# Patient Record
Sex: Female | Born: 1945
Health system: Southern US, Community
[De-identification: ages and names within clinical notes are randomized; demographics above are authoritative.]

## PROBLEM LIST (undated history)

## (undated) DIAGNOSIS — K635 Polyp of colon: Secondary | ICD-10-CM

## (undated) DIAGNOSIS — G43909 Migraine, unspecified, not intractable, without status migrainosus: Secondary | ICD-10-CM

## (undated) DIAGNOSIS — E7800 Pure hypercholesterolemia, unspecified: Secondary | ICD-10-CM

## (undated) DIAGNOSIS — F32A Depression, unspecified: Secondary | ICD-10-CM

## (undated) DIAGNOSIS — E039 Hypothyroidism, unspecified: Secondary | ICD-10-CM

## (undated) DIAGNOSIS — K589 Irritable bowel syndrome without diarrhea: Secondary | ICD-10-CM

## (undated) DIAGNOSIS — G629 Polyneuropathy, unspecified: Secondary | ICD-10-CM

## (undated) DIAGNOSIS — F419 Anxiety disorder, unspecified: Secondary | ICD-10-CM

## (undated) DIAGNOSIS — F329 Major depressive disorder, single episode, unspecified: Secondary | ICD-10-CM

## (undated) DIAGNOSIS — R002 Palpitations: Secondary | ICD-10-CM

## (undated) HISTORY — PX: TUBAL LIGATION: SHX77

---

## 1999-12-05 ENCOUNTER — Encounter: Admission: RE | Admit: 1999-12-05 | Discharge: 1999-12-05 | Payer: Self-pay | Admitting: Family Medicine

## 1999-12-05 ENCOUNTER — Encounter: Payer: Self-pay | Admitting: Family Medicine

## 2000-03-17 ENCOUNTER — Ambulatory Visit (HOSPITAL_COMMUNITY): Admission: RE | Admit: 2000-03-17 | Discharge: 2000-03-17 | Payer: Self-pay | Admitting: Obstetrics and Gynecology

## 2000-03-17 ENCOUNTER — Encounter: Payer: Self-pay | Admitting: Obstetrics and Gynecology

## 2000-03-25 ENCOUNTER — Encounter (INDEPENDENT_AMBULATORY_CARE_PROVIDER_SITE_OTHER): Payer: Self-pay | Admitting: Specialist

## 2000-03-25 ENCOUNTER — Ambulatory Visit (HOSPITAL_COMMUNITY): Admission: RE | Admit: 2000-03-25 | Discharge: 2000-03-25 | Payer: Self-pay | Admitting: Gastroenterology

## 2001-03-04 ENCOUNTER — Other Ambulatory Visit: Admission: RE | Admit: 2001-03-04 | Discharge: 2001-03-04 | Payer: Self-pay | Admitting: Obstetrics and Gynecology

## 2001-03-23 ENCOUNTER — Ambulatory Visit (HOSPITAL_COMMUNITY): Admission: RE | Admit: 2001-03-23 | Discharge: 2001-03-23 | Payer: Self-pay | Admitting: Obstetrics and Gynecology

## 2001-03-23 ENCOUNTER — Encounter: Payer: Self-pay | Admitting: Obstetrics and Gynecology

## 2001-04-20 ENCOUNTER — Encounter: Payer: Self-pay | Admitting: Obstetrics and Gynecology

## 2001-04-20 ENCOUNTER — Encounter: Admission: RE | Admit: 2001-04-20 | Discharge: 2001-04-20 | Payer: Self-pay | Admitting: Obstetrics and Gynecology

## 2002-03-29 ENCOUNTER — Encounter: Payer: Self-pay | Admitting: Obstetrics and Gynecology

## 2002-03-29 ENCOUNTER — Ambulatory Visit (HOSPITAL_COMMUNITY): Admission: RE | Admit: 2002-03-29 | Discharge: 2002-03-29 | Payer: Self-pay | Admitting: Obstetrics and Gynecology

## 2002-04-06 ENCOUNTER — Other Ambulatory Visit: Admission: RE | Admit: 2002-04-06 | Discharge: 2002-04-06 | Payer: Self-pay | Admitting: Obstetrics and Gynecology

## 2003-04-18 ENCOUNTER — Ambulatory Visit (HOSPITAL_COMMUNITY): Admission: RE | Admit: 2003-04-18 | Discharge: 2003-04-18 | Payer: Self-pay | Admitting: Obstetrics and Gynecology

## 2003-06-14 ENCOUNTER — Other Ambulatory Visit: Admission: RE | Admit: 2003-06-14 | Discharge: 2003-06-14 | Payer: Self-pay | Admitting: Obstetrics and Gynecology

## 2003-07-28 ENCOUNTER — Ambulatory Visit (HOSPITAL_COMMUNITY): Admission: RE | Admit: 2003-07-28 | Discharge: 2003-07-28 | Payer: Self-pay | Admitting: Gastroenterology

## 2004-04-04 ENCOUNTER — Encounter: Admission: RE | Admit: 2004-04-04 | Discharge: 2004-04-04 | Payer: Self-pay | Admitting: Otolaryngology

## 2004-04-25 ENCOUNTER — Ambulatory Visit (HOSPITAL_COMMUNITY): Admission: RE | Admit: 2004-04-25 | Discharge: 2004-04-25 | Payer: Self-pay | Admitting: Obstetrics and Gynecology

## 2004-07-08 ENCOUNTER — Other Ambulatory Visit: Admission: RE | Admit: 2004-07-08 | Discharge: 2004-07-08 | Payer: Self-pay | Admitting: Obstetrics and Gynecology

## 2004-11-15 ENCOUNTER — Encounter: Admission: RE | Admit: 2004-11-15 | Discharge: 2004-11-15 | Payer: Self-pay | Admitting: Gastroenterology

## 2005-01-24 ENCOUNTER — Encounter: Admission: RE | Admit: 2005-01-24 | Discharge: 2005-01-24 | Payer: Self-pay | Admitting: Family Medicine

## 2005-06-23 ENCOUNTER — Ambulatory Visit (HOSPITAL_COMMUNITY): Admission: RE | Admit: 2005-06-23 | Discharge: 2005-06-23 | Payer: Self-pay | Admitting: Obstetrics and Gynecology

## 2005-08-19 ENCOUNTER — Other Ambulatory Visit: Admission: RE | Admit: 2005-08-19 | Discharge: 2005-08-19 | Payer: Self-pay | Admitting: Obstetrics and Gynecology

## 2005-10-16 ENCOUNTER — Encounter: Admission: RE | Admit: 2005-10-16 | Discharge: 2005-10-16 | Payer: Self-pay | Admitting: Surgery

## 2006-07-08 ENCOUNTER — Ambulatory Visit (HOSPITAL_COMMUNITY): Admission: RE | Admit: 2006-07-08 | Discharge: 2006-07-08 | Payer: Self-pay | Admitting: Obstetrics and Gynecology

## 2006-07-09 ENCOUNTER — Ambulatory Visit (HOSPITAL_COMMUNITY): Admission: RE | Admit: 2006-07-09 | Discharge: 2006-07-09 | Payer: Self-pay | Admitting: Obstetrics and Gynecology

## 2007-07-13 ENCOUNTER — Ambulatory Visit (HOSPITAL_COMMUNITY): Admission: RE | Admit: 2007-07-13 | Discharge: 2007-07-13 | Payer: Self-pay | Admitting: Obstetrics and Gynecology

## 2008-07-20 ENCOUNTER — Ambulatory Visit (HOSPITAL_COMMUNITY): Admission: RE | Admit: 2008-07-20 | Discharge: 2008-07-20 | Payer: Self-pay | Admitting: Obstetrics and Gynecology

## 2009-07-26 ENCOUNTER — Ambulatory Visit (HOSPITAL_COMMUNITY): Admission: RE | Admit: 2009-07-26 | Discharge: 2009-07-26 | Payer: Self-pay | Admitting: Obstetrics and Gynecology

## 2010-05-05 ENCOUNTER — Encounter: Payer: Self-pay | Admitting: Obstetrics and Gynecology

## 2010-06-21 ENCOUNTER — Other Ambulatory Visit (HOSPITAL_COMMUNITY): Payer: Self-pay | Admitting: Obstetrics and Gynecology

## 2010-06-21 DIAGNOSIS — Z780 Asymptomatic menopausal state: Secondary | ICD-10-CM

## 2010-06-21 DIAGNOSIS — Z1231 Encounter for screening mammogram for malignant neoplasm of breast: Secondary | ICD-10-CM

## 2010-07-29 ENCOUNTER — Ambulatory Visit (HOSPITAL_COMMUNITY)
Admission: RE | Admit: 2010-07-29 | Discharge: 2010-07-29 | Disposition: A | Discharge status: Home / Self Care | Payer: Medicare Other | Source: Ambulatory Visit | Attending: Obstetrics and Gynecology | Admitting: Obstetrics and Gynecology

## 2010-07-29 DIAGNOSIS — Z1231 Encounter for screening mammogram for malignant neoplasm of breast: Secondary | ICD-10-CM

## 2010-07-29 DIAGNOSIS — Z13820 Encounter for screening for osteoporosis: Secondary | ICD-10-CM | POA: Insufficient documentation

## 2010-07-29 DIAGNOSIS — Z780 Asymptomatic menopausal state: Secondary | ICD-10-CM

## 2010-08-30 NOTE — Procedures (Signed)
[facility]  Patient:    Toni Rich, Toni Rich                          MRN: [id number] Proc. Date: 03/25/00 Attending:  Everardo All. Madilyn Fireman, M.D. CC:         Anna Genre. Little, M.D.   Procedure Report  PROCEDURE:  Colonoscopy with polypectomy.  INDICATION FOR PROCEDURE:  History of adenomatous colon polyps on colonoscopy 3 years ago.  DESCRIPTION OF PROCEDURE:  The patient was placed in the left lateral decubitus position and placed on the pulse monitor with continuous low flow oxygen delivered by nasal cannula. She was sedated with 75 mg IV Demerol and 7 mg IV Versed. The Olympus video colonoscope was inserted into the rectum and advanced to the cecum, confirmed by transillumination at McBurneys point and visualization of the ileocecal valve and appendiceal orifice. The prep was excellent. The cecum appeared normal. Within the ascending colon, there was a sessile 4 x 1.2 cm polyp which was fulgurated by hot biopsy with 2 or 3 applications. The remainder of the ascending, transverse and descending colon appeared normal with no further masses, polyps, diverticula or other mucosal abnormalities. Within the sigmoid colon, there were seen a few scattered diverticula and within the rectum there was seen a 6 mm round sessile polyp which was fulgurated by hot biopsy. The rectum otherwise appeared normal and retroflexed view of the anus revealed no obvious internal hemorrhoids. The colonoscope was then withdrawn and the patient returned to the recovery room in stable condition. The patient tolerated the procedure well and there were no immediate complications.  IMPRESSION: 1. Ascending and rectal colon polyps. 2. Left sided diverticulosis.  PLAN:  Await histology and will probably repeat colonoscopy in 3 years. DD:  03/25/00 TD:  03/25/00 Job: [id number]

## 2010-08-30 NOTE — Op Note (Signed)
NAME:  Toni Rich, Toni Rich                           ACCOUNT NO.:  [id number]   MEDICAL RECORD NO.:  [id number]                   PATIENT TYPE:  AMB   LOCATION:  ENDO                                 FACILITY:  [facility]   PHYSICIAN:  John C. Madilyn Fireman, M.D.                 DATE OF BIRTH:  [birth year]   DATE OF PROCEDURE:  07/28/2003  DATE OF DISCHARGE:                                 OPERATIVE REPORT   PROCEDURE:  Colonoscopy.   INDICATIONS FOR PROCEDURE:  History of adenomatous colon polyps in 1998 and  2001.   PROCEDURE:  The patient was placed in the left lateral decubitus position  and placed on the pulse monitor with continuous low flow oxygen delivered by  nasal cannula.  She was sedated with 75 mcg IV fentanyl and 10 mg IV Versed.  The Olympus video colonoscope is inserted into the rectum and advanced to  the cecum, confirmed by transillumination at McBurney's point and  visualization of the ileocecal valve and appendiceal orifice.  Prep is  excellent.  The cecum, ascending, transverse, and descending colon all  appeared normal with no masses, polyps, diverticula, or other mucosal  abnormalities.  In the sigmoid colon, there was seen a few scattered  diverticula.  No other abnormalities.  The rectum appeared normal, and  retroflexed view of the anus revealed no obvious internal hemorrhoids.  The  scope was then withdrawn, and the patient returned to the recovery room in  stable condition.  She tolerated the procedure well, and there were no  immediate complications.   IMPRESSION:  Sigmoid diverticulosis, otherwise normal study.   PLAN:  Colonoscopy in five years.                                               John C. Madilyn Fireman, M.D.    JCH/MEDQ  D:  07/28/2003  T:  07/28/2003  Job:  [id number]   cc:   Caryn Bee L. Little, M.D.  [address]  [place]  [place] [address]  Fax: [phone]

## 2011-05-12 DIAGNOSIS — K219 Gastro-esophageal reflux disease without esophagitis: Secondary | ICD-10-CM | POA: Diagnosis not present

## 2011-05-12 DIAGNOSIS — E039 Hypothyroidism, unspecified: Secondary | ICD-10-CM | POA: Diagnosis not present

## 2011-05-12 DIAGNOSIS — F3289 Other specified depressive episodes: Secondary | ICD-10-CM | POA: Diagnosis not present

## 2011-05-12 DIAGNOSIS — E7800 Pure hypercholesterolemia, unspecified: Secondary | ICD-10-CM | POA: Diagnosis not present

## 2011-05-12 DIAGNOSIS — Z79899 Other long term (current) drug therapy: Secondary | ICD-10-CM | POA: Diagnosis not present

## 2011-05-12 DIAGNOSIS — F329 Major depressive disorder, single episode, unspecified: Secondary | ICD-10-CM | POA: Diagnosis not present

## 2011-05-12 DIAGNOSIS — R002 Palpitations: Secondary | ICD-10-CM | POA: Diagnosis not present

## 2011-05-12 DIAGNOSIS — R82998 Other abnormal findings in urine: Secondary | ICD-10-CM | POA: Diagnosis not present

## 2011-06-09 DIAGNOSIS — R109 Unspecified abdominal pain: Secondary | ICD-10-CM | POA: Diagnosis not present

## 2011-06-13 DIAGNOSIS — R799 Abnormal finding of blood chemistry, unspecified: Secondary | ICD-10-CM | POA: Diagnosis not present

## 2011-06-19 DIAGNOSIS — D7589 Other specified diseases of blood and blood-forming organs: Secondary | ICD-10-CM | POA: Diagnosis not present

## 2011-06-19 DIAGNOSIS — D751 Secondary polycythemia: Secondary | ICD-10-CM | POA: Diagnosis not present

## 2011-07-01 DIAGNOSIS — K589 Irritable bowel syndrome without diarrhea: Secondary | ICD-10-CM | POA: Diagnosis not present

## 2011-07-01 DIAGNOSIS — F411 Generalized anxiety disorder: Secondary | ICD-10-CM | POA: Diagnosis not present

## 2011-07-01 DIAGNOSIS — F329 Major depressive disorder, single episode, unspecified: Secondary | ICD-10-CM | POA: Diagnosis not present

## 2011-07-01 DIAGNOSIS — F3289 Other specified depressive episodes: Secondary | ICD-10-CM | POA: Diagnosis not present

## 2011-07-15 ENCOUNTER — Other Ambulatory Visit (HOSPITAL_COMMUNITY): Payer: Self-pay | Admitting: Obstetrics and Gynecology

## 2011-07-15 DIAGNOSIS — Z1231 Encounter for screening mammogram for malignant neoplasm of breast: Secondary | ICD-10-CM

## 2011-07-21 DIAGNOSIS — G43019 Migraine without aura, intractable, without status migrainosus: Secondary | ICD-10-CM | POA: Diagnosis not present

## 2011-08-14 ENCOUNTER — Ambulatory Visit (HOSPITAL_COMMUNITY)
Admission: RE | Admit: 2011-08-14 | Discharge: 2011-08-14 | Disposition: A | Discharge status: Home / Self Care | Payer: Medicare Other | Source: Ambulatory Visit | Attending: Obstetrics and Gynecology | Admitting: Obstetrics and Gynecology

## 2011-08-14 DIAGNOSIS — Z1231 Encounter for screening mammogram for malignant neoplasm of breast: Secondary | ICD-10-CM

## 2011-09-20 DIAGNOSIS — H10439 Chronic follicular conjunctivitis, unspecified eye: Secondary | ICD-10-CM | POA: Diagnosis not present

## 2011-09-24 DIAGNOSIS — D7589 Other specified diseases of blood and blood-forming organs: Secondary | ICD-10-CM | POA: Diagnosis not present

## 2011-09-24 DIAGNOSIS — R0602 Shortness of breath: Secondary | ICD-10-CM | POA: Diagnosis not present

## 2011-09-24 DIAGNOSIS — R5383 Other fatigue: Secondary | ICD-10-CM | POA: Diagnosis not present

## 2011-09-24 DIAGNOSIS — R5381 Other malaise: Secondary | ICD-10-CM | POA: Diagnosis not present

## 2011-10-20 DIAGNOSIS — R0602 Shortness of breath: Secondary | ICD-10-CM | POA: Diagnosis not present

## 2011-10-30 DIAGNOSIS — Z0000 Encounter for general adult medical examination without abnormal findings: Secondary | ICD-10-CM | POA: Diagnosis not present

## 2011-10-30 DIAGNOSIS — Z01419 Encounter for gynecological examination (general) (routine) without abnormal findings: Secondary | ICD-10-CM | POA: Diagnosis not present

## 2011-10-30 DIAGNOSIS — Z124 Encounter for screening for malignant neoplasm of cervix: Secondary | ICD-10-CM | POA: Diagnosis not present

## 2011-10-31 DIAGNOSIS — R0602 Shortness of breath: Secondary | ICD-10-CM | POA: Diagnosis not present

## 2011-11-07 DIAGNOSIS — L259 Unspecified contact dermatitis, unspecified cause: Secondary | ICD-10-CM | POA: Diagnosis not present

## 2011-11-07 DIAGNOSIS — R0989 Other specified symptoms and signs involving the circulatory and respiratory systems: Secondary | ICD-10-CM | POA: Diagnosis not present

## 2011-12-06 DIAGNOSIS — H5200 Hypermetropia, unspecified eye: Secondary | ICD-10-CM | POA: Diagnosis not present

## 2011-12-06 DIAGNOSIS — H2510 Age-related nuclear cataract, unspecified eye: Secondary | ICD-10-CM | POA: Diagnosis not present

## 2011-12-06 DIAGNOSIS — H524 Presbyopia: Secondary | ICD-10-CM | POA: Diagnosis not present

## 2011-12-06 DIAGNOSIS — H52229 Regular astigmatism, unspecified eye: Secondary | ICD-10-CM | POA: Diagnosis not present

## 2011-12-13 ENCOUNTER — Encounter (HOSPITAL_BASED_OUTPATIENT_CLINIC_OR_DEPARTMENT_OTHER): Payer: Self-pay | Admitting: Emergency Medicine

## 2011-12-13 ENCOUNTER — Emergency Department (HOSPITAL_BASED_OUTPATIENT_CLINIC_OR_DEPARTMENT_OTHER): Payer: Medicare Other

## 2011-12-13 ENCOUNTER — Inpatient Hospital Stay (HOSPITAL_BASED_OUTPATIENT_CLINIC_OR_DEPARTMENT_OTHER)
Admission: EM | Admit: 2011-12-13 | Discharge: 2011-12-18 | DRG: 386 | Disposition: A | Discharge status: Home / Self Care | Payer: Medicare Other | Attending: Internal Medicine | Admitting: Internal Medicine

## 2011-12-13 DIAGNOSIS — J9819 Other pulmonary collapse: Secondary | ICD-10-CM | POA: Diagnosis not present

## 2011-12-13 DIAGNOSIS — G609 Hereditary and idiopathic neuropathy, unspecified: Secondary | ICD-10-CM | POA: Diagnosis present

## 2011-12-13 DIAGNOSIS — K529 Noninfective gastroenteritis and colitis, unspecified: Secondary | ICD-10-CM

## 2011-12-13 DIAGNOSIS — K589 Irritable bowel syndrome without diarrhea: Secondary | ICD-10-CM | POA: Diagnosis present

## 2011-12-13 DIAGNOSIS — Z882 Allergy status to sulfonamides status: Secondary | ICD-10-CM | POA: Diagnosis not present

## 2011-12-13 DIAGNOSIS — R002 Palpitations: Secondary | ICD-10-CM | POA: Diagnosis present

## 2011-12-13 DIAGNOSIS — Z87891 Personal history of nicotine dependence: Secondary | ICD-10-CM

## 2011-12-13 DIAGNOSIS — R110 Nausea: Secondary | ICD-10-CM | POA: Diagnosis not present

## 2011-12-13 DIAGNOSIS — F3289 Other specified depressive episodes: Secondary | ICD-10-CM | POA: Diagnosis present

## 2011-12-13 DIAGNOSIS — E039 Hypothyroidism, unspecified: Secondary | ICD-10-CM | POA: Diagnosis present

## 2011-12-13 DIAGNOSIS — K5100 Ulcerative (chronic) pancolitis without complications: Principal | ICD-10-CM | POA: Diagnosis present

## 2011-12-13 DIAGNOSIS — E876 Hypokalemia: Secondary | ICD-10-CM | POA: Diagnosis not present

## 2011-12-13 DIAGNOSIS — R0902 Hypoxemia: Secondary | ICD-10-CM | POA: Diagnosis not present

## 2011-12-13 DIAGNOSIS — K5289 Other specified noninfective gastroenteritis and colitis: Secondary | ICD-10-CM | POA: Diagnosis not present

## 2011-12-13 DIAGNOSIS — F411 Generalized anxiety disorder: Secondary | ICD-10-CM | POA: Diagnosis present

## 2011-12-13 DIAGNOSIS — J40 Bronchitis, not specified as acute or chronic: Secondary | ICD-10-CM

## 2011-12-13 DIAGNOSIS — E7800 Pure hypercholesterolemia, unspecified: Secondary | ICD-10-CM | POA: Diagnosis present

## 2011-12-13 DIAGNOSIS — R82998 Other abnormal findings in urine: Secondary | ICD-10-CM | POA: Diagnosis present

## 2011-12-13 DIAGNOSIS — Z860100 Personal history of colon polyps, unspecified: Secondary | ICD-10-CM

## 2011-12-13 DIAGNOSIS — IMO0002 Reserved for concepts with insufficient information to code with codable children: Secondary | ICD-10-CM

## 2011-12-13 DIAGNOSIS — R109 Unspecified abdominal pain: Secondary | ICD-10-CM | POA: Diagnosis not present

## 2011-12-13 DIAGNOSIS — D72829 Elevated white blood cell count, unspecified: Secondary | ICD-10-CM | POA: Diagnosis not present

## 2011-12-13 DIAGNOSIS — F329 Major depressive disorder, single episode, unspecified: Secondary | ICD-10-CM | POA: Diagnosis present

## 2011-12-13 DIAGNOSIS — J9811 Atelectasis: Secondary | ICD-10-CM

## 2011-12-13 DIAGNOSIS — R1110 Vomiting, unspecified: Secondary | ICD-10-CM | POA: Diagnosis not present

## 2011-12-13 DIAGNOSIS — J209 Acute bronchitis, unspecified: Secondary | ICD-10-CM | POA: Diagnosis not present

## 2011-12-13 DIAGNOSIS — K8020 Calculus of gallbladder without cholecystitis without obstruction: Secondary | ICD-10-CM | POA: Diagnosis not present

## 2011-12-13 DIAGNOSIS — Z79899 Other long term (current) drug therapy: Secondary | ICD-10-CM | POA: Diagnosis not present

## 2011-12-13 HISTORY — DX: Anxiety disorder, unspecified: F41.9

## 2011-12-13 HISTORY — DX: Palpitations: R00.2

## 2011-12-13 HISTORY — DX: Migraine, unspecified, not intractable, without status migrainosus: G43.909

## 2011-12-13 HISTORY — DX: Polyneuropathy, unspecified: G62.9

## 2011-12-13 HISTORY — DX: Major depressive disorder, single episode, unspecified: F32.9

## 2011-12-13 HISTORY — DX: Pure hypercholesterolemia, unspecified: E78.00

## 2011-12-13 HISTORY — DX: Depression, unspecified: F32.A

## 2011-12-13 HISTORY — DX: Irritable bowel syndrome, unspecified: K58.9

## 2011-12-13 HISTORY — DX: Hypothyroidism, unspecified: E03.9

## 2011-12-13 HISTORY — DX: Polyp of colon: K63.5

## 2011-12-13 LAB — CBC WITH DIFFERENTIAL/PLATELET
Basophils Absolute: 0 K/uL (ref 0.0–0.1)
Basophils Relative: 0 % (ref 0–1)
Eosinophils Absolute: 0 K/uL (ref 0.0–0.7)
Eosinophils Relative: 0 % (ref 0–5)
HCT: 40.8 % (ref 36.0–46.0)
Hemoglobin: 14.3 g/dL (ref 12.0–15.0)
Lymphocytes Relative: 9 % — ABNORMAL LOW (ref 12–46)
Lymphs Abs: 1.1 K/uL (ref 0.7–4.0)
MCH: 33.3 pg (ref 26.0–34.0)
MCHC: 35 g/dL (ref 30.0–36.0)
MCV: 95.1 fL (ref 78.0–100.0)
Monocytes Absolute: 0.5 K/uL (ref 0.1–1.0)
Monocytes Relative: 4 % (ref 3–12)
Neutro Abs: 10.5 K/uL — ABNORMAL HIGH (ref 1.7–7.7)
Neutrophils Relative %: 86 % — ABNORMAL HIGH (ref 43–77)
Platelets: 239 K/uL (ref 150–400)
RBC: 4.29 MIL/uL (ref 3.87–5.11)
RDW: 12 % (ref 11.5–15.5)
WBC: 12.1 K/uL — ABNORMAL HIGH (ref 4.0–10.5)

## 2011-12-13 LAB — COMPREHENSIVE METABOLIC PANEL
ALT: 25 U/L (ref 0–35)
AST: 26 U/L (ref 0–37)
CO2: 26 mEq/L (ref 19–32)
Calcium: 9.2 mg/dL (ref 8.4–10.5)
Creatinine, Ser: 0.8 mg/dL (ref 0.50–1.10)
GFR calc non Af Amer: 75 mL/min — ABNORMAL LOW (ref >90)
Sodium: 140 mEq/L (ref 135–145)
Total Protein: 7.4 g/dL (ref 6.0–8.3)

## 2011-12-13 LAB — URINALYSIS, ROUTINE W REFLEX MICROSCOPIC
Glucose, UA: NEGATIVE mg/dL
Protein, ur: 30 mg/dL — AB
Specific Gravity, Urine: 1.026 (ref 1.005–1.030)
Urobilinogen, UA: 1 mg/dL (ref 0.0–1.0)

## 2011-12-13 LAB — CBC
HCT: 39.8 % (ref 36.0–46.0)
MCV: 95.7 fL (ref 78.0–100.0)
RBC: 4.16 MIL/uL (ref 3.87–5.11)
WBC: 13.9 10*3/uL — ABNORMAL HIGH (ref 4.0–10.5)

## 2011-12-13 LAB — URINE MICROSCOPIC-ADD ON

## 2011-12-13 LAB — CREATININE, SERUM
GFR calc Af Amer: >90 mL/min (ref >90)
GFR calc non Af Amer: >90 mL/min (ref >90)

## 2011-12-13 MED ORDER — ONDANSETRON HCL 4 MG/2ML IJ SOLN
4.0000 mg | Freq: Once | INTRAMUSCULAR | Status: AC
  Administered 2011-12-13: 4 mg via INTRAVENOUS
  Filled 2011-12-13: qty 2

## 2011-12-13 MED ORDER — CIPROFLOXACIN IN D5W 400 MG/200ML IV SOLN
400.0000 mg | Freq: Two times a day (BID) | INTRAVENOUS | Status: DC
  Administered 2011-12-13 – 2011-12-17 (×8): 400 mg via INTRAVENOUS
  Filled 2011-12-13 (×9): qty 200

## 2011-12-13 MED ORDER — DEXTROSE 5 % IV SOLN: 1.0000 g | Freq: Once | INTRAVENOUS | Status: DC

## 2011-12-13 MED ORDER — HYDROMORPHONE HCL PF 1 MG/ML IJ SOLN
1.0000 mg | INTRAMUSCULAR | Status: DC | PRN
  Administered 2011-12-13 – 2011-12-14 (×4): 1 mg via INTRAVENOUS
  Filled 2011-12-13 (×4): qty 1

## 2011-12-13 MED ORDER — PANTOPRAZOLE SODIUM 40 MG IV SOLR
40.0000 mg | INTRAVENOUS | Status: DC
  Administered 2011-12-13 – 2011-12-17 (×5): 40 mg via INTRAVENOUS
  Filled 2011-12-13 (×7): qty 40

## 2011-12-13 MED ORDER — HYDROMORPHONE HCL PF 1 MG/ML IJ SOLN
1.0000 mg | Freq: Once | INTRAMUSCULAR | Status: AC
  Administered 2011-12-13: 1 mg via INTRAVENOUS
  Filled 2011-12-13: qty 1

## 2011-12-13 MED ORDER — SODIUM CHLORIDE 0.9 % IV SOLN
INTRAVENOUS | Status: DC
  Administered 2011-12-14 – 2011-12-15 (×3): via INTRAVENOUS
  Administered 2011-12-16: 20 mL/h via INTRAVENOUS
  Administered 2011-12-18: 250 mL via INTRAVENOUS

## 2011-12-13 MED ORDER — ONDANSETRON HCL 4 MG/2ML IJ SOLN
4.0000 mg | Freq: Four times a day (QID) | INTRAMUSCULAR | Status: DC | PRN
  Administered 2011-12-13: 4 mg via INTRAVENOUS
  Filled 2011-12-13: qty 2

## 2011-12-13 MED ORDER — PROMETHAZINE HCL 25 MG/ML IJ SOLN
INTRAMUSCULAR | Status: AC
  Administered 2011-12-13: 12.5 mg via INTRAVENOUS
  Filled 2011-12-13: qty 1

## 2011-12-13 MED ORDER — METRONIDAZOLE IN NACL 5-0.79 MG/ML-% IV SOLN
500.0000 mg | Freq: Three times a day (TID) | INTRAVENOUS | Status: DC
  Administered 2011-12-13 – 2011-12-17 (×12): 500 mg via INTRAVENOUS
  Filled 2011-12-13 (×14): qty 100

## 2011-12-13 MED ORDER — SODIUM CHLORIDE 0.9 % IV SOLN
INTRAVENOUS | Status: DC
  Administered 2011-12-13: 14:00:00 via INTRAVENOUS

## 2011-12-13 MED ORDER — LEVOTHYROXINE SODIUM 100 MCG IV SOLR
25.0000 ug | Freq: Every day | INTRAVENOUS | Status: DC
  Administered 2011-12-14 – 2011-12-15 (×2): 26 ug via INTRAVENOUS
  Filled 2011-12-13 (×2): qty 1.3

## 2011-12-13 MED ORDER — ACETAMINOPHEN 325 MG PO TABS
650.0000 mg | ORAL_TABLET | Freq: Four times a day (QID) | ORAL | Status: DC | PRN
  Administered 2011-12-14 – 2011-12-15 (×2): 650 mg via ORAL
  Administered 2011-12-16: 325 mg via ORAL
  Administered 2011-12-16: 650 mg via ORAL
  Filled 2011-12-13 (×4): qty 2

## 2011-12-13 MED ORDER — IOHEXOL 300 MG/ML  SOLN
100.0000 mL | Freq: Once | INTRAMUSCULAR | Status: AC | PRN
  Administered 2011-12-13: 100 mL via INTRAVENOUS

## 2011-12-13 MED ORDER — SODIUM CHLORIDE 0.9 % IV BOLUS (SEPSIS)
1000.0000 mL | Freq: Once | INTRAVENOUS | Status: AC
  Administered 2011-12-13: 1000 mL via INTRAVENOUS

## 2011-12-13 MED ORDER — PROMETHAZINE HCL 25 MG/ML IJ SOLN
12.5000 mg | Freq: Once | INTRAMUSCULAR | Status: AC
  Administered 2011-12-13: 12.5 mg via INTRAVENOUS

## 2011-12-13 MED ORDER — ENOXAPARIN SODIUM 40 MG/0.4ML ~~LOC~~ SOLN
40.0000 mg | SUBCUTANEOUS | Status: DC
  Administered 2011-12-13 – 2011-12-17 (×5): 40 mg via SUBCUTANEOUS
  Filled 2011-12-13 (×6): qty 0.4

## 2011-12-13 MED ORDER — METRONIDAZOLE IN NACL 5-0.79 MG/ML-% IV SOLN
500.0000 mg | Freq: Once | INTRAVENOUS | Status: AC
  Administered 2011-12-13: 500 mg via INTRAVENOUS
  Filled 2011-12-13: qty 100

## 2011-12-13 MED ORDER — CIPROFLOXACIN IN D5W 400 MG/200ML IV SOLN
400.0000 mg | Freq: Once | INTRAVENOUS | Status: AC
  Administered 2011-12-13: 400 mg via INTRAVENOUS
  Filled 2011-12-13: qty 200

## 2011-12-13 MED ORDER — ACETAMINOPHEN 650 MG RE SUPP: 650.0000 mg | Freq: Four times a day (QID) | RECTAL | Status: DC | PRN

## 2011-12-13 MED ORDER — HYDROMORPHONE HCL PF 1 MG/ML IJ SOLN
1.0000 mg | INTRAMUSCULAR | Status: DC | PRN
  Administered 2011-12-13: 1 mg via INTRAVENOUS
  Filled 2011-12-13: qty 1

## 2011-12-13 MED ORDER — HYDROMORPHONE HCL PF 1 MG/ML IJ SOLN
1.0000 mg | Freq: Once | INTRAMUSCULAR | Status: AC
  Administered 2011-12-13: 1 mg via INTRAVENOUS

## 2011-12-13 MED ORDER — ONDANSETRON HCL 4 MG/2ML IJ SOLN
4.0000 mg | Freq: Three times a day (TID) | INTRAMUSCULAR | Status: DC | PRN
  Administered 2011-12-13: 4 mg via INTRAVENOUS
  Filled 2011-12-13: qty 2

## 2011-12-13 MED ORDER — HYDROMORPHONE HCL PF 1 MG/ML IJ SOLN
INTRAMUSCULAR | Status: AC
  Administered 2011-12-13: 1 mg via INTRAVENOUS
  Filled 2011-12-13: qty 1

## 2011-12-13 NOTE — ED Notes (Signed)
Pt drank approx 3/4 of oral contrast, unable to tolerate more d/t persistent nausea.  CT notified.

## 2011-12-13 NOTE — ED Provider Notes (Addendum)
History     CSN: [id number]  Arrival date & time 12/13/11  6440   First MD Initiated Contact with Patient 12/13/11 1021      Chief Complaint  Patient presents with  . Abdominal Pain  . Emesis    (Consider location/radiation/quality/duration/timing/severity/associated sxs/prior treatment) HPI Patient with abdominal pain with history of ibs.  Patient had pain on Wednesday, resolved and able to keep ibs meds down.  Pain returned at 0200 this a.m. Awaking from sleep.  Pain diffuse and crampy continuous in nature wit associated nausea and vomiting.  Tried to take ibs meds but vomited them up.  Vomited clear to yellow emesis.  No diarrhea, fever, no blood in vomit or stool.  Pain is 7/10, patient denies prior hospitalization for same.  PMD is Dr. Hyacinth Meeker at [facility], and GI is Dr. Madilyn Fireman.  Patient seen at walk in clinic today.   Past Medical History  Diagnosis Date  . IBS (irritable bowel syndrome)   . Palpitations   . Peripheral neuropathy   . Hypothyroid   . Colonic polyp   . Hypercholesteremia   . Anxiety   . Depression   . Migraines     Past Surgical History  Procedure Date  . Cesarean section   . Tubal ligation     No family history on file.  History  Substance Use Topics  . Smoking status: Former Games developer  . Smokeless tobacco: Not on file  . Alcohol Use: No    OB History    Grav Para Term Preterm Abortions TAB SAB Ect Mult Living                  Review of Systems  Allergies  Sulfa antibiotics  Home Medications  No current outpatient prescriptions on file.  BP 142/71  Pulse 65  Temp 97.8 F (36.6 C) (Oral)  Resp 18  Ht 5\' 2"  (1.575 m)  Wt 148 lb (67.132 kg)  BMI 27.07 kg/m2  SpO2 98%  Physical Exam  Nursing note and vitals reviewed. Constitutional: She is oriented to person, place, and time. She appears well-developed and well-nourished.  HENT:  Head: Normocephalic and atraumatic.  Eyes: Conjunctivae normal are normal. Pupils are equal, round, and  reactive to light.  Neck: Normal range of motion. Neck supple.  Cardiovascular: Normal rate and regular rhythm.   Pulmonary/Chest: Effort normal.  Abdominal: There is tenderness.       Mild diffuse right sided tenderness of abdomen  Musculoskeletal: Normal range of motion.  Neurological: She is alert and oriented to person, place, and time.  Skin: Skin is warm and dry.  Psychiatric: She has a normal mood and affect. Her behavior is normal.    ED Course  Procedures (including critical care time)  Labs Reviewed - No data to display No results found.   No diagnosis found.   Results for orders placed during the hospital encounter of 12/13/11  CBC WITH DIFFERENTIAL      Component Value Range   WBC 12.1 (*) 4.0 - 10.5 K/uL   RBC 4.29  3.87 - 5.11 MIL/uL   Hemoglobin 14.3  12.0 - 15.0 g/dL   HCT 34.7  42.5 - 95.6 %   MCV 95.1  78.0 - 100.0 fL   MCH 33.3  26.0 - 34.0 pg   MCHC 35.0  30.0 - 36.0 g/dL   RDW 38.7  56.4 - 33.2 %   Platelets 239  150 - 400 K/uL   Neutrophils Relative 86 (*) 43 -  77 %   Neutro Abs 10.5 (*) 1.7 - 7.7 K/uL   Lymphocytes Relative 9 (*) 12 - 46 %   Lymphs Abs 1.1  0.7 - 4.0 K/uL   Monocytes Relative 4  3 - 12 %   Monocytes Absolute 0.5  0.1 - 1.0 K/uL   Eosinophils Relative 0  0 - 5 %   Eosinophils Absolute 0.0  0.0 - 0.7 K/uL   Basophils Relative 0  0 - 1 %   Basophils Absolute 0.0  0.0 - 0.1 K/uL  COMPREHENSIVE METABOLIC PANEL      Component Value Range   Sodium 140  135 - 145 mEq/L   Potassium 3.6  3.5 - 5.1 mEq/L   Chloride 103  96 - 112 mEq/L   CO2 26  19 - 32 mEq/L   Glucose, Bld 124 (*) 70 - 99 mg/dL   BUN 11  6 - 23 mg/dL   Creatinine, Ser 4.09  0.50 - 1.10 mg/dL   Calcium 9.2  8.4 - 81.1 mg/dL   Total Protein 7.4  6.0 - 8.3 g/dL   Albumin 4.3  3.5 - 5.2 g/dL   AST 26  0 - 37 U/L   ALT 25  0 - 35 U/L   Alkaline Phosphatase 119 (*) 39 - 117 U/L   Total Bilirubin 1.4 (*) 0.3 - 1.2 mg/dL   GFR calc non Af Amer 75 (*) >90 mL/min   GFR  calc Af Amer 86 (*) >90 mL/min  LIPASE, BLOOD      Component Value Range   Lipase 26  11 - 59 U/L  URINALYSIS, ROUTINE W REFLEX MICROSCOPIC      Component Value Range   Color, Urine AMBER (*) YELLOW   APPearance CLOUDY (*) CLEAR   Specific Gravity, Urine 1.026  1.005 - 1.030   pH 6.0  5.0 - 8.0   Glucose, UA NEGATIVE  NEGATIVE mg/dL   Hgb urine dipstick TRACE (*) NEGATIVE   Bilirubin Urine NEGATIVE  NEGATIVE   Ketones, ur 15 (*) NEGATIVE mg/dL   Protein, ur 30 (*) NEGATIVE mg/dL   Urobilinogen, UA 1.0  0.0 - 1.0 mg/dL   Nitrite POSITIVE (*) NEGATIVE   Leukocytes, UA SMALL (*) NEGATIVE  URINE MICROSCOPIC-ADD ON      Component Value Range   Squamous Epithelial / LPF FEW (*) RARE   WBC, UA 11-20  <3 WBC/hpf   RBC / HPF 3-6  <3 RBC/hpf   Bacteria, UA MANY (*) RARE    MDM  Patient presented with complaints of a diffuse abdominal pain with nausea and vomiting that she reports as secondary to her IBS. Patient with diffuse moderate abdominal pain on exam. She received IV fluids, IV pain medicines, and anti-emetics.  Patient has an elevated white blood cell count and diffuse colitis seen on her CT scan. CT reviewed. Cipro and Flagyl ordered IV. Patient continues to complain of pain at 7/10. She is given another milligram of Dilaudid. Patient continues to appear very uncomfortable. She will be transferred to [facility] for further treatment of her colitis with antibiotics, pain medicine, and anti-emetics. I have discussed this with the patient and her husband.  MDM Number of Diagnoses or Management Options   The patient appears reasonably stabilized for admission considering the current resources, flow, and capabilities available in the ED at this time, and I doubt any other [facility] requiring further screening and/or treatment in the ED prior to admission.      Duwayne Heck  Denny Levy, MD 12/13/11 1336  Hilario Quarry, MD 02/13/12 [phone]

## 2011-12-13 NOTE — Progress Notes (Signed)
Patient accepted from [facility], settled in room, given call bell.  Called MD in system, Dr. Malachi Bonds who stated that team 8 will be taking this patient.  Team 8 is already aware per Dr. Malachi Bonds.  Will await orders from physician and/or team.  Roland Rack, RN

## 2011-12-13 NOTE — ED Notes (Signed)
Pt c/o generalized abd pain & vomiting since 2am; hx of IBS, but sts she is unable to keep meds down

## 2011-12-13 NOTE — H&P (Signed)
[facility]          History and Physical    PCP: Sigmund Hazel MD,  [facility]  Chief Complaint:  Nausea and vomiting  HPI: Toni Rich is a 66 year old female with history of irritable bowel syndrome reports that she had some mild abdominal pain on Wednesday which resolved with some dicyclomine. Subsequently woke up this morning around 2:00am with diffuse crampy abdominal pain associated with nausea and vomiting. Since then has been throwing up pretty much anything she tried to eat or drink. Vomitus is described as nonbloody nonbilious. Denies any diarrhea. Also denies fevers or chills Subsequently evaluation the emergency room noted colitis and mild leukocytosis. Patient denies any sick contacts, denies eating uncooked or unusual food or seafood. Suspects she was treated with an antibiotic or an antifungal for a suspected yeast infection 2 or 3 weeks ago  Allergies:   Allergies  Allergen Reactions  . Sulfa Antibiotics Other (See Comments)    unknown      Past Medical History  Diagnosis Date  . IBS (irritable bowel syndrome)   . Palpitations   . Peripheral neuropathy   . Hypothyroid   . Colonic polyp   . Hypercholesteremia   . Anxiety   . Depression   . Migraines     Past Surgical History  Procedure Date  . Cesarean section   . Tubal ligation     Prior to Admission medications   Medication Sig Start Date End Date Taking? Authorizing Provider  atenolol (TENORMIN) 50 MG tablet Take 50 mg by mouth daily.   Yes Historical Provider, MD  Calcium Carbonate-Vitamin D (CALCIUM 600 + D PO) Take 1 tablet by mouth daily.   Yes Historical Provider, MD  cholecalciferol (VITAMIN D) 1000 UNITS tablet Take 2,000 Units by mouth daily.   Yes Historical Provider, MD  dicyclomine (BENTYL) 20 MG tablet Take 20 mg by mouth as needed. For acid reflux.   Yes Historical Provider, MD  estradiol (ESTRACE) 1 MG tablet Take 1 mg by mouth daily.   Yes Historical Provider, MD    gabapentin (NEURONTIN) 600 MG tablet Take 600 mg by mouth every morning.   Yes Historical Provider, MD  levothyroxine (SYNTHROID, LEVOTHROID) 75 MCG tablet Take 75 mcg by mouth daily.   Yes Historical Provider, MD  LORazepam (ATIVAN) 1 MG tablet Take 1 mg by mouth every 8 (eight) hours.   Yes Historical Provider, MD  medroxyPROGESTERone (PROVERA) 5 MG tablet Take 5 mg by mouth daily.   Yes Historical Provider, MD  Multiple Vitamins-Minerals (MULTIVITAMIN PO) Take 1 tablet by mouth daily.   Yes Historical Provider, MD  omeprazole (PRILOSEC) 20 MG capsule Take 20 mg by mouth as needed. For acid reflux.   Yes Historical Provider, MD  simvastatin (ZOCOR) 40 MG tablet Take 40 mg by mouth every evening.   Yes Historical Provider, MD  valACYclovir (VALTREX) 1000 MG tablet Take 1,000 mg by mouth 2 (two) times daily.   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 She does not have any smokeless tobacco history on file. She reports that she does not drink alcohol. Her drug history not on file.  No family history on file.  Review of Systems:  Constitutional: Denies fever, chills, diaphoresis, appetite change and fatigue.  HEENT: Denies photophobia, eye pain, redness, hearing loss, ear pain, congestion, sore throat, rhinorrhea, sneezing, mouth sores, trouble swallowing,  neck pain, neck stiffness and tinnitus.   Respiratory: Denies SOB, DOE, cough, chest tightness,  and wheezing.   Cardiovascular: Denies chest pain, palpitations and leg swelling.  Gastrointestinal: Denies nausea, vomiting, abdominal pain, diarrhea, constipation, blood in stool and abdominal distention.  Genitourinary: Denies dysuria, urgency, frequency, hematuria, flank pain and difficulty urinating.  Musculoskeletal: Denies myalgias, back pain, joint swelling, arthralgias and gait problem.  Skin: Denies pallor, rash and wound.   Neurological: Denies dizziness, seizures, syncope, weakness, light-headedness, numbness and headaches.  Hematological: Denies adenopathy. Easy bruising, personal or family bleeding history  Psychiatric/Behavioral: Denies suicidal ideation, mood changes, confusion, nervousness, sleep disturbance and agitation   Physical Exam: Blood pressure 144/58, pulse 75, temperature 97.5 F (36.4 C), temperature source Oral, resp. rate 18, height 5\' 2"  (1.575 m), weight 67.132 kg (148 lb), SpO2 100.00%. General alert awake oriented x3 in in mild distress secondary to pain  HEENT pupils equal reactive to light oral mucosa moist and pink CVS S1-S2 regular rate rhythm no murmurs rubs or gallops Lungs clear auscultation bilaterally Abdomen soft, diffuse tenderness, no rigidity or rebound  , bowel sounds present but diminished no organomegaly Extremities no edema clubbing or cyanosis Neuro moves all extremities no localizing signs Skin no rashes or skin breakdown  Labs on Admission:  Results for orders placed during the hospital encounter of 12/13/11 (from the past 48 hour(s))  CBC WITH DIFFERENTIAL     Status: Abnormal   Collection Time   12/13/11 10:50 AM      Component Value Range Comment   WBC 12.1 (*) 4.0 - 10.5 K/uL    RBC 4.29  3.87 - 5.11 MIL/uL    Hemoglobin 14.3  12.0 - 15.0 g/dL    HCT 16.1  09.6 - 04.5 %    MCV 95.1  78.0 - 100.0 fL    MCH 33.3  26.0 - 34.0 pg    MCHC 35.0  30.0 - 36.0 g/dL    RDW 40.9  81.1 - 91.4 %    Platelets 239  150 - 400 K/uL    Neutrophils Relative 86 (*) 43 - 77 %    Neutro Abs 10.5 (*) 1.7 - 7.7 K/uL    Lymphocytes Relative 9 (*) 12 - 46 %    Lymphs Abs 1.1  0.7 - 4.0 K/uL    Monocytes Relative 4  3 - 12 %    Monocytes Absolute 0.5  0.1 - 1.0 K/uL    Eosinophils Relative 0  0 - 5 %    Eosinophils Absolute 0.0  0.0 - 0.7 K/uL    Basophils Relative 0  0 - 1 %    Basophils Absolute 0.0  0.0 - 0.1 K/uL   COMPREHENSIVE METABOLIC PANEL     Status: Abnormal    Collection Time   12/13/11 10:50 AM      Component Value Range Comment   Sodium 140  135 - 145 mEq/L    Potassium 3.6  3.5 - 5.1 mEq/L    Chloride 103  96 - 112 mEq/L    CO2 26  19 - 32 mEq/L    Glucose, Bld 124 (*) 70 - 99 mg/dL    BUN 11  6 - 23 mg/dL    Creatinine, Ser 7.82  0.50 - 1.10 mg/dL    Calcium 9.2  8.4 - 95.6 mg/dL    Total Protein 7.4  6.0 - 8.3 g/dL    Albumin 4.3  3.5 - 5.2 g/dL    AST 26  0 - 37 U/L    ALT 25  0 - 35 U/L    Alkaline Phosphatase 119 (*) 39 - 117 U/L    Total Bilirubin 1.4 (*) 0.3 - 1.2 mg/dL    GFR calc non Af Amer 75 (*) >90 mL/min    GFR calc Af Amer 86 (*) >90 mL/min   LIPASE, BLOOD     Status: Normal   Collection Time   12/13/11 10:50 AM      Component Value Range Comment   Lipase 26  11 - 59 U/L   URINALYSIS, ROUTINE W REFLEX MICROSCOPIC     Status: Abnormal   Collection Time   12/13/11 11:03 AM      Component Value Range Comment   Color, Urine AMBER (*) YELLOW BIOCHEMICALS MAY BE AFFECTED BY COLOR   APPearance CLOUDY (*) CLEAR    Specific Gravity, Urine 1.026  1.005 - 1.030    pH 6.0  5.0 - 8.0    Glucose, UA NEGATIVE  NEGATIVE mg/dL    Hgb urine dipstick TRACE (*) NEGATIVE    Bilirubin Urine NEGATIVE  NEGATIVE    Ketones, ur 15 (*) NEGATIVE mg/dL    Protein, ur 30 (*) NEGATIVE mg/dL    Urobilinogen, UA 1.0  0.0 - 1.0 mg/dL    Nitrite POSITIVE (*) NEGATIVE    Leukocytes, UA SMALL (*) NEGATIVE   URINE MICROSCOPIC-ADD ON     Status: Abnormal   Collection Time   12/13/11 11:03 AM      Component Value Range Comment   Squamous Epithelial / LPF FEW (*) RARE    WBC, UA 11-20  <3 WBC/hpf    RBC / HPF 3-6  <3 RBC/hpf    Bacteria, UA MANY (*) RARE     Radiological Exams on Admission: Ct Abdomen Pelvis W Contrast  12/13/2011  *RADIOLOGY REPORT*  Clinical Data: Abdominal pain.  Leukocytosis with white blood count 12,100.  Nausea and vomiting.  Surgical history includes tubal ligation and cesarean section.  The prior history of diverticulitis.   CT ABDOMEN AND PELVIS WITH CONTRAST  Technique:  Multidetector CT imaging of the abdomen and pelvis was performed following the standard protocol during bolus administration of intravenous contrast.  Contrast: OMNIPAQUE IOHEXOL 300 MG/ML.  Oral contrast was also administered.  Comparison: CT abdomen and pelvis 10/16/2005, 11/15/2004.  Findings: Wall thickening with enhancement and adjacent edema/inflammation involving the colon beginning at the level of the distal ascending colon and extending continuously to the rectum.  Scattered diverticula noted throughout the colon, greatest in the sigmoid region, without evidence of acute diverticulitis. Cecum and proximal ascending colon normal in appearance.  Normal appendix in the right upper pelvis.  Diverticulum arising from the medial aspect of the junction of the second and third duodenum. Normal appearing small bowel otherwise.  Stomach normal in appearance, mildly distended with the ingested oral contrast material.  No ascites.  Mild diffuse hepatic steatosis without focal hepatic parenchymal abnormality.  Normal-appearing spleen, pancreas, adrenal glands, and kidneys.  Small calcified gallstone in the neck of the gallbladder or within the cystic duct; no CT evidence for acute cholecystitis.  No biliary ductal dilation.  No common duct stone. Mild aorto-iliac atherosclerosis.  Patent visceral arteries, with mild stenosis at the origin of the celiac artery.  No significant lymphadenopathy.  Uterus atrophic consistent with age.  Prominent vessels in the left adnexa, consistent with left ovarian varicocele, as the left ovarian vein is dilated throughout its length.  No adnexal  masses. No free pelvic fluid.  Urinary bladder decompressed and unremarkable.  Bone window images demonstrate severe degenerative disc disease at L5-S1, lower thoracic spondylosis, and facet degenerative changes involving the lower lumbar spine.  Visualized lung bases clear apart from the  expected dependent atelectasis posteriorly.  Heart mildly enlarged.  IMPRESSION:  1.  Colitis which involves the distal ascending colon, transverse colon, descending colon, sigmoid colon, and rectum.  The proximal ascending colon and cecum are spared. 2.  Diffuse colonic diverticulosis without evidence of acute diverticulitis. 3.  Mild diffuse hepatic steatosis without focal hepatic parenchymal abnormality. 4.  Small calcified gallstone which is either in the gallbladder neck or the cystic duct.  No evidence of acute cholecystitis by CT. 5.  Duodenal diverticulum. 6.  Left ovarian varicocele.   Original Report Authenticated By: Arnell Sieving, M.D.     Assessment/Plan 1. Pancolitis : Suspect infectious versus inflammatory   unlikely to be ischemic since she does not have risk factors for this Start IV ciprofloxacin and metronidazole Supportive care with IV fluids, antiemetics, narcotics for pain control Bowel rest-n.p.o. Status Check C. difficile PCR, stool culture Last colonoscopy in 2005 showed diffuse diverticulosis If does not improve with above management will consider [facility] evaluation  2. Hypothyroidism: Continue Synthroid as IV  3. leukocytosis secondary to #1  4. history of palpitations: Hold atenolol while n.p.o., If evidence of rebound tachycardia or arrhythmias we'll use IV Lopressor   5. DVT prophylaxis with Lovenox  Time Spent on Admission:  , [facility] Pager: [phone] 12/13/2011, 6:16 PM

## 2011-12-14 ENCOUNTER — Inpatient Hospital Stay (HOSPITAL_COMMUNITY): Payer: Medicare Other

## 2011-12-14 DIAGNOSIS — D72829 Elevated white blood cell count, unspecified: Secondary | ICD-10-CM | POA: Diagnosis not present

## 2011-12-14 DIAGNOSIS — K5289 Other specified noninfective gastroenteritis and colitis: Secondary | ICD-10-CM

## 2011-12-14 DIAGNOSIS — R0902 Hypoxemia: Secondary | ICD-10-CM | POA: Diagnosis not present

## 2011-12-14 LAB — COMPREHENSIVE METABOLIC PANEL
ALT: 22 U/L (ref 0–35)
Albumin: 3.3 g/dL — ABNORMAL LOW (ref 3.5–5.2)
Alkaline Phosphatase: 105 U/L (ref 39–117)
BUN: 10 mg/dL (ref 6–23)
Chloride: 103 mEq/L (ref 96–112)
Glucose, Bld: 126 mg/dL — ABNORMAL HIGH (ref 70–99)
Potassium: 3.6 mEq/L (ref 3.5–5.1)
Sodium: 137 mEq/L (ref 135–145)
Total Bilirubin: 1.7 mg/dL — ABNORMAL HIGH (ref 0.3–1.2)
Total Protein: 6.4 g/dL (ref 6.0–8.3)

## 2011-12-14 LAB — CBC
HCT: 39.5 % (ref 36.0–46.0)
Hemoglobin: 13.7 g/dL (ref 12.0–15.0)
RDW: 12.5 % (ref 11.5–15.5)
WBC: 16.7 10*3/uL — ABNORMAL HIGH (ref 4.0–10.5)

## 2011-12-14 LAB — D-DIMER, QUANTITATIVE: D-Dimer, Quant: 1.68 ug/mL-FEU — ABNORMAL HIGH (ref 0.00–0.48)

## 2011-12-14 MED ORDER — DIPHENHYDRAMINE HCL 50 MG/ML IJ SOLN
INTRAMUSCULAR | Status: AC
  Filled 2011-12-14: qty 1

## 2011-12-14 MED ORDER — BIOTENE DRY MOUTH MT LIQD
15.0000 mL | Freq: Two times a day (BID) | OROMUCOSAL | Status: DC
  Administered 2011-12-14 – 2011-12-15 (×4): 15 mL via OROMUCOSAL

## 2011-12-14 MED ORDER — DIPHENHYDRAMINE HCL 50 MG/ML IJ SOLN
12.5000 mg | Freq: Once | INTRAMUSCULAR | Status: AC
  Administered 2011-12-14: 12.5 mg via INTRAVENOUS

## 2011-12-14 MED ORDER — CHLORHEXIDINE GLUCONATE 0.12 % MT SOLN
15.0000 mL | Freq: Two times a day (BID) | OROMUCOSAL | Status: DC
  Administered 2011-12-14 – 2011-12-15 (×4): 15 mL via OROMUCOSAL
  Filled 2011-12-14 (×4): qty 15

## 2011-12-14 MED ORDER — HYDROMORPHONE HCL PF 1 MG/ML IJ SOLN
1.0000 mg | INTRAMUSCULAR | Status: DC | PRN
  Administered 2011-12-14 – 2011-12-18 (×17): 1 mg via INTRAVENOUS
  Filled 2011-12-14 (×17): qty 1

## 2011-12-14 NOTE — Progress Notes (Signed)
[facility]             Progress Note   Subjective: Still in pain but a little better, more in R side today, no further N/Vomiting  Objective: Vital signs in last 24 hours: Temp:  [97.5 F (36.4 C)-98.8 F (37.1 C)] 98.8 F (37.1 C) (09/01 0513) Pulse Rate:  [62-83] 81  (09/01 0513) Resp:  [14-18] 18  (09/01 0513) BP: (105-144)/(52-73) 131/68 mmHg (09/01 0513) SpO2:  [88 %-100 %] 95 % (09/01 0513) Weight:  [67.132 kg (148 lb)] 67.132 kg (148 lb) (08/31 1725) Weight change:  Last BM Date: 12/13/11  Intake/Output from previous day: 08/31 0701 - 09/01 0700 In: 1250 [I.V.:1250] Out: -      Physical Exam: General: Alert, awake, oriented x3, in no acute distress. HEENT: No bruits, no goiter. Heart: Regular rate and rhythm, without murmurs, rubs, gallops. Lungs: basilar crackles Abdomen: Soft, mild periumbilical and R sided tenderness, no rigidity or rebound, bowel sounds diminished but present Extremities: No clubbing cyanosis or edema with positive pedal pulses. Neuro: Grossly intact, nonfocal.    Lab Results: Basic Metabolic Panel:  Basename 12/14/11 0500 12/13/11 1835 12/13/11 1050  NA 137 -- 140  K 3.6 -- 3.6  CL 103 -- 103  CO2 23 -- 26  GLUCOSE 126* -- 124*  BUN 10 -- 11  CREATININE 0.70 0.63 --  CALCIUM 8.1* -- 9.2  MG -- -- --  PHOS -- -- --   Liver Function Tests:  Basename 12/14/11 0500 12/13/11 1050  AST 28 26  ALT 22 25  ALKPHOS 105 119*  BILITOT 1.7* 1.4*  PROT 6.4 7.4  ALBUMIN 3.3* 4.3    Basename 12/13/11 1050  LIPASE 26  AMYLASE --   No results found for this basename: AMMONIA:2 in the last 72 hours CBC:  Basename 12/14/11 0500 12/13/11 1835 12/13/11 1050  WBC 16.7* 13.9* --  NEUTROABS -- -- 10.5*  HGB 13.7 13.8 --  HCT 39.5 39.8 --  MCV 95.6 95.7 --  PLT 228 200 --   Cardiac Enzymes: No results found for this basename: CKTOTAL:3,CKMB:3,CKMBINDEX:3,TROPONINI:3 in the last 72 hours BNP: No results found for  this basename: PROBNP:3 in the last 72 hours D-Dimer: No results found for this basename: DDIMER:2 in the last 72 hours CBG: No results found for this basename: GLUCAP:6 in the last 72 hours Hemoglobin A1C: No results found for this basename: HGBA1C in the last 72 hours Fasting Lipid Panel: No results found for this basename: CHOL,HDL,LDLCALC,TRIG,CHOLHDL,LDLDIRECT in the last 72 hours Thyroid Function Tests: No results found for this basename: TSH,T4TOTAL,FREET4,T3FREE,THYROIDAB in the last 72 hours Anemia Panel: No results found for this basename: VITAMINB12,FOLATE,FERRITIN,TIBC,IRON,RETICCTPCT in the last 72 hours Coagulation: No results found for this basename: LABPROT:2,INR:2 in the last 72 hours Urine Drug Screen: Drugs of Abuse  No results found for this basename: labopia, cocainscrnur, labbenz, amphetmu, thcu, labbarb    Alcohol Level: No results found for this basename: ETH:2 in the last 72 hours Urinalysis:  Basename 12/13/11 1103  COLORURINE AMBER*  LABSPEC 1.026  PHURINE 6.0  GLUCOSEU NEGATIVE  HGBUR TRACE*  BILIRUBINUR NEGATIVE  KETONESUR 15*  PROTEINUR 30*  UROBILINOGEN 1.0  NITRITE POSITIVE*  LEUKOCYTESUR SMALL*    No results found for this or any previous visit (from the past 240 hour(s)).  Studies/Results: Ct Abdomen Pelvis W Contrast  12/13/2011  *RADIOLOGY REPORT*  Clinical Data: Abdominal pain.  Leukocytosis with white blood count 12,100.  Nausea and vomiting.  Surgical history  includes tubal ligation and cesarean section.  The prior history of diverticulitis.  CT ABDOMEN AND PELVIS WITH CONTRAST  Technique:  Multidetector CT imaging of the abdomen and pelvis was performed following the standard protocol during bolus administration of intravenous contrast.  Contrast: OMNIPAQUE IOHEXOL 300 MG/ML.  Oral contrast was also administered.  Comparison: CT abdomen and pelvis 10/16/2005, 11/15/2004.  Findings: Wall thickening with enhancement and adjacent  edema/inflammation involving the colon beginning at the level of the distal ascending colon and extending continuously to the rectum.  Scattered diverticula noted throughout the colon, greatest in the sigmoid region, without evidence of acute diverticulitis. Cecum and proximal ascending colon normal in appearance.  Normal appendix in the right upper pelvis.  Diverticulum arising from the medial aspect of the junction of the second and third duodenum. Normal appearing small bowel otherwise.  Stomach normal in appearance, mildly distended with the ingested oral contrast material.  No ascites.  Mild diffuse hepatic steatosis without focal hepatic parenchymal abnormality.  Normal-appearing spleen, pancreas, adrenal glands, and kidneys.  Small calcified gallstone in the neck of the gallbladder or within the cystic duct; no CT evidence for acute cholecystitis.  No biliary ductal dilation.  No common duct stone. Mild aorto-iliac atherosclerosis.  Patent visceral arteries, with mild stenosis at the origin of the celiac artery.  No significant lymphadenopathy.  Uterus atrophic consistent with age.  Prominent vessels in the left adnexa, consistent with left ovarian varicocele, as the left ovarian vein is dilated throughout its length.  No adnexal masses. No free pelvic fluid.  Urinary bladder decompressed and unremarkable.  Bone window images demonstrate severe degenerative disc disease at L5-S1, lower thoracic spondylosis, and facet degenerative changes involving the lower lumbar spine.  Visualized lung bases clear apart from the expected dependent atelectasis posteriorly.  Heart mildly enlarged.  IMPRESSION:  1.  Colitis which involves the distal ascending colon, transverse colon, descending colon, sigmoid colon, and rectum.  The proximal ascending colon and cecum are spared. 2.  Diffuse colonic diverticulosis without evidence of acute diverticulitis. 3.  Mild diffuse hepatic steatosis without focal hepatic parenchymal  abnormality. 4.  Small calcified gallstone which is either in the gallbladder neck or the cystic duct.  No evidence of acute cholecystitis by CT. 5.  Duodenal diverticulum. 6.  Left ovarian varicocele.   Original Report Authenticated By: Arnell Sieving, M.D.     Medications: Scheduled Meds:   . antiseptic oral rinse  15 mL Mouth Rinse q12n4p  . chlorhexidine  15 mL Mouth Rinse BID  . ciprofloxacin  400 mg Intravenous Once  . ciprofloxacin  400 mg Intravenous Q12H  . enoxaparin (LOVENOX) injection  40 mg Subcutaneous Q24H  .  HYDROmorphone (DILAUDID) injection  1 mg Intravenous Once  .  HYDROmorphone (DILAUDID) injection  1 mg Intravenous Once  . levothyroxine  26 mcg Intravenous Daily  . metronidazole  500 mg Intravenous Once  . metronidazole  500 mg Intravenous Q8H  . ondansetron (ZOFRAN) IV  4 mg Intravenous Once  . pantoprazole (PROTONIX) IV  40 mg Intravenous Q24H  . promethazine  12.5 mg Intravenous Once  . sodium chloride  1,000 mL Intravenous Once  . DISCONTD: sodium chloride   Intravenous STAT  . DISCONTD: cefTRIAXone (ROCEPHIN)  IV  1 g Intravenous Once   Continuous Infusions:   . sodium chloride 100 mL/hr at 12/14/11 0441   PRN Meds:.acetaminophen, acetaminophen, HYDROmorphone (DILAUDID) injection, iohexol, ondansetron (ZOFRAN) IV, DISCONTD:  HYDROmorphone (DILAUDID) injection, DISCONTD:  HYDROmorphone (DILAUDID) injection,  DISCONTD: ondansetron (ZOFRAN) IV  Assessment/Plan:  1. Pancolitis : Suspect infectious versus inflammatory  unlikely to be ischemic since she does not have risk factors for this  Continue  IV ciprofloxacin and metronidazole  Supportive care with IV fluids, antiemetics, narcotics for pain control  Bowel rest-n.p.o. status today as well, ok to have sips and ice chips Check C. difficile PCR, stool culture , no BM yet for stool studies Last colonoscopy in 2005 showed diffuse diverticulosis  If does not improve with above management will consider  [facility] evaluation   2. Hypothyroidism: Continue Synthroid as IV   3. leukocytosis secondary to #1   4. history of palpitations: Hold atenolol while n.p.o., If evidence of rebound tachycardia or arrhythmias we'll use IV Lopressor   5. Asymptomatic pyuria: already on abx, no value in checking urine culture at this point  6. DVT prophylaxis with Lovenox       LOS: 1 day   [facility] Pager: [phone] 12/14/2011, 10:42 AM

## 2011-12-15 DIAGNOSIS — K5289 Other specified noninfective gastroenteritis and colitis: Secondary | ICD-10-CM | POA: Diagnosis not present

## 2011-12-15 DIAGNOSIS — J9819 Other pulmonary collapse: Secondary | ICD-10-CM

## 2011-12-15 DIAGNOSIS — J9811 Atelectasis: Secondary | ICD-10-CM | POA: Diagnosis present

## 2011-12-15 DIAGNOSIS — D72829 Elevated white blood cell count, unspecified: Secondary | ICD-10-CM | POA: Diagnosis not present

## 2011-12-15 LAB — COMPREHENSIVE METABOLIC PANEL
Albumin: 2.8 g/dL — ABNORMAL LOW (ref 3.5–5.2)
BUN: 11 mg/dL (ref 6–23)
Calcium: 8 mg/dL — ABNORMAL LOW (ref 8.4–10.5)
Creatinine, Ser: 0.67 mg/dL (ref 0.50–1.10)
GFR calc Af Amer: >90 mL/min (ref >90)
Glucose, Bld: 108 mg/dL — ABNORMAL HIGH (ref 70–99)
Potassium: 3.3 mEq/L — ABNORMAL LOW (ref 3.5–5.1)
Total Protein: 5.6 g/dL — ABNORMAL LOW (ref 6.0–8.3)

## 2011-12-15 LAB — CBC
HCT: 37 % (ref 36.0–46.0)
Hemoglobin: 12.6 g/dL (ref 12.0–15.0)
MCH: 32.5 pg (ref 26.0–34.0)
MCHC: 34.1 g/dL (ref 30.0–36.0)
RDW: 12.5 % (ref 11.5–15.5)

## 2011-12-15 MED ORDER — LEVOTHYROXINE SODIUM 75 MCG PO TABS
75.0000 ug | ORAL_TABLET | Freq: Every day | ORAL | Status: DC
  Administered 2011-12-16 – 2011-12-18 (×3): 75 ug via ORAL
  Filled 2011-12-15 (×4): qty 1

## 2011-12-15 MED ORDER — ATENOLOL 50 MG PO TABS
50.0000 mg | ORAL_TABLET | Freq: Every day | ORAL | Status: DC
  Administered 2011-12-15 – 2011-12-18 (×4): 50 mg via ORAL
  Filled 2011-12-15 (×4): qty 1

## 2011-12-15 MED ORDER — WHITE PETROLATUM GEL
Status: AC
  Filled 2011-12-15: qty 5

## 2011-12-15 NOTE — Progress Notes (Addendum)
[facility]             Progress Note   Subjective: Still in pain but a little better,  no further N/Vomiting, no BM yet  Objective: Vital signs in last 24 hours: Temp:  [98.1 F (36.7 C)-99.3 F (37.4 C)] 98.1 F (36.7 C) (09/02 0543) Pulse Rate:  [95-100] 95  (09/02 0543) Resp:  [15-18] 15  (09/02 0543) BP: (124-137)/(62-87) 137/65 mmHg (09/02 0543) SpO2:  [92 %-94 %] 93 % (09/02 0543) Weight change:  Last BM Date: 12/13/11  Intake/Output from previous day: 09/01 0701 - 09/02 0700 In: 2400 [I.V.:2400] Out: -      Physical Exam: General: Alert, awake, oriented x3, in no acute distress. HEENT: No bruits, no goiter. Heart: Regular rate and rhythm, without murmurs, rubs, gallops. Lungs: basilar crackles Abdomen: Soft, mild periumbilical and R sided tenderness, slightly distended, no rigidity or rebound, bowel sounds diminished but present Extremities: No clubbing cyanosis or edema with positive pedal pulses. Neuro: Grossly intact, nonfocal.    Lab Results: Basic Metabolic Panel:  Basename 12/15/11 0500 12/14/11 0500  NA 136 137  K 3.3* 3.6  CL 104 103  CO2 23 23  GLUCOSE 108* 126*  BUN 11 10  CREATININE 0.67 0.70  CALCIUM 8.0* 8.1*  MG -- --  PHOS -- --   Liver Function Tests:  Basename 12/15/11 0500 12/14/11 0500  AST 31 28  ALT 21 22  ALKPHOS 95 105  BILITOT 2.1* 1.7*  PROT 5.6* 6.4  ALBUMIN 2.8* 3.3*    Basename 12/13/11 1050  LIPASE 26  AMYLASE --   No results found for this basename: AMMONIA:2 in the last 72 hours CBC:  Basename 12/15/11 0500 12/14/11 0500 12/13/11 1050  WBC 14.7* 16.7* --  NEUTROABS -- -- 10.5*  HGB 12.6 13.7 --  HCT 37.0 39.5 --  MCV 95.4 95.6 --  PLT 193 228 --   Cardiac Enzymes: No results found for this basename: CKTOTAL:3,CKMB:3,CKMBINDEX:3,TROPONINI:3 in the last 72 hours BNP: No results found for this basename: PROBNP:3 in the last 72 hours D-Dimer:  Alvira Philips 12/14/11 2223  DDIMER 1.68*    CBG: No results found for this basename: GLUCAP:6 in the last 72 hours Hemoglobin A1C: No results found for this basename: HGBA1C in the last 72 hours Fasting Lipid Panel: No results found for this basename: CHOL,HDL,LDLCALC,TRIG,CHOLHDL,LDLDIRECT in the last 72 hours Thyroid Function Tests: No results found for this basename: TSH,T4TOTAL,FREET4,T3FREE,THYROIDAB in the last 72 hours Anemia Panel: No results found for this basename: VITAMINB12,FOLATE,FERRITIN,TIBC,IRON,RETICCTPCT in the last 72 hours Coagulation: No results found for this basename: LABPROT:2,INR:2 in the last 72 hours Urine Drug Screen: Drugs of Abuse  No results found for this basename: labopia,  cocainscrnur,  labbenz,  amphetmu,  thcu,  labbarb    Alcohol Level: No results found for this basename: ETH:2 in the last 72 hours Urinalysis:  Basename 12/13/11 1103  COLORURINE AMBER*  LABSPEC 1.026  PHURINE 6.0  GLUCOSEU NEGATIVE  HGBUR TRACE*  BILIRUBINUR NEGATIVE  KETONESUR 15*  PROTEINUR 30*  UROBILINOGEN 1.0  NITRITE POSITIVE*  LEUKOCYTESUR SMALL*    No results found for this or any previous visit (from the past 240 hour(s)).  Studies/Results: Ct Abdomen Pelvis W Contrast  12/13/2011  *RADIOLOGY REPORT*  Clinical Data: Abdominal pain.  Leukocytosis with white blood count 12,100.  Nausea and vomiting.  Surgical history includes tubal ligation and cesarean section.  The prior history of diverticulitis.  CT ABDOMEN AND PELVIS WITH CONTRAST  Technique:  Multidetector CT imaging of the abdomen and pelvis was performed following the standard protocol during bolus administration of intravenous contrast.  Contrast: OMNIPAQUE IOHEXOL 300 MG/ML.  Oral contrast was also administered.  Comparison: CT abdomen and pelvis 10/16/2005, 11/15/2004.  Findings: Wall thickening with enhancement and adjacent edema/inflammation involving the colon beginning at the level of the distal ascending colon and extending  continuously to the rectum.  Scattered diverticula noted throughout the colon, greatest in the sigmoid region, without evidence of acute diverticulitis. Cecum and proximal ascending colon normal in appearance.  Normal appendix in the right upper pelvis.  Diverticulum arising from the medial aspect of the junction of the second and third duodenum. Normal appearing small bowel otherwise.  Stomach normal in appearance, mildly distended with the ingested oral contrast material.  No ascites.  Mild diffuse hepatic steatosis without focal hepatic parenchymal abnormality.  Normal-appearing spleen, pancreas, adrenal glands, and kidneys.  Small calcified gallstone in the neck of the gallbladder or within the cystic duct; no CT evidence for acute cholecystitis.  No biliary ductal dilation.  No common duct stone. Mild aorto-iliac atherosclerosis.  Patent visceral arteries, with mild stenosis at the origin of the celiac artery.  No significant lymphadenopathy.  Uterus atrophic consistent with age.  Prominent vessels in the left adnexa, consistent with left ovarian varicocele, as the left ovarian vein is dilated throughout its length.  No adnexal masses. No free pelvic fluid.  Urinary bladder decompressed and unremarkable.  Bone window images demonstrate severe degenerative disc disease at L5-S1, lower thoracic spondylosis, and facet degenerative changes involving the lower lumbar spine.  Visualized lung bases clear apart from the expected dependent atelectasis posteriorly.  Heart mildly enlarged.  IMPRESSION:  1.  Colitis which involves the distal ascending colon, transverse colon, descending colon, sigmoid colon, and rectum.  The proximal ascending colon and cecum are spared. 2.  Diffuse colonic diverticulosis without evidence of acute diverticulitis. 3.  Mild diffuse hepatic steatosis without focal hepatic parenchymal abnormality. 4.  Small calcified gallstone which is either in the gallbladder neck or the cystic duct.  No  evidence of acute cholecystitis by CT. 5.  Duodenal diverticulum. 6.  Left ovarian varicocele.   Original Report Authenticated By: Arnell Sieving, M.D.    Dg Chest Port 1 View  12/14/2011  *RADIOLOGY REPORT*  Clinical Data: Hypoxia  PORTABLE CHEST - 1 VIEW  Comparison: Portable exam 2231 hours compared to 09/24/2011  Findings: Elevation of right diaphragm. Upper normal heart size. Bibasilar atelectasis versus infiltrate greater on right. Bronchitic changes. No definite pleural effusion or pneumothorax.  IMPRESSION: New bibasilar atelectasis versus infiltrate greater on the right. Elevation of right diaphragm. Bronchitic changes.   Original Report Authenticated By: Lollie Marrow, M.D.     Medications: Scheduled Meds:    . antiseptic oral rinse  15 mL Mouth Rinse q12n4p  . atenolol  50 mg Oral Daily  . chlorhexidine  15 mL Mouth Rinse BID  . ciprofloxacin  400 mg Intravenous Q12H  . diphenhydrAMINE      . diphenhydrAMINE  12.5 mg Intravenous Once  . enoxaparin (LOVENOX) injection  40 mg Subcutaneous Q24H  . levothyroxine  75 mcg Oral QAC breakfast  . metronidazole  500 mg Intravenous Q8H  . pantoprazole (PROTONIX) IV  40 mg Intravenous Q24H  . white petrolatum      . DISCONTD: levothyroxine  26 mcg Intravenous Daily   Continuous Infusions:    . sodium chloride 100 mL/hr at 12/15/11 0412   PRN  Meds:.acetaminophen, acetaminophen, HYDROmorphone (DILAUDID) injection, ondansetron (ZOFRAN) IV  Assessment/Plan:  1. Pancolitis : Suspect infectious versus inflammatory  unlikely to be ischemic since she does not have risk factors for this  Continue  IV ciprofloxacin and metronidazole  Supportive care with IV fluids, antiemetics, narcotics for pain control  Start clears today Check C. difficile PCR, stool culture , no BM yet for stool studies Last colonoscopy in 2005 showed diffuse diverticulosis  If does not improve with above management will consider [facility] evaluation   2.  Hypothyroidism: Continue Synthroid , change to Po today  3. leukocytosis secondary to #1   4. history of palpitations: restart atenolol today   5. Asymptomatic pyuria: already on abx, no value in checking urine culture at this point  6. Atelectasis/hypoxia: incentive spirometry, ambulate, wean O2  7. DVT prophylaxis with Lovenox       LOS: 2 days   [facility] Pager: [phone] 12/15/2011, 12:18 PM

## 2011-12-16 DIAGNOSIS — K5289 Other specified noninfective gastroenteritis and colitis: Secondary | ICD-10-CM | POA: Diagnosis not present

## 2011-12-16 DIAGNOSIS — D72829 Elevated white blood cell count, unspecified: Secondary | ICD-10-CM | POA: Diagnosis not present

## 2011-12-16 DIAGNOSIS — J9819 Other pulmonary collapse: Secondary | ICD-10-CM | POA: Diagnosis not present

## 2011-12-16 LAB — CBC
HCT: 35.6 % — ABNORMAL LOW (ref 36.0–46.0)
Hemoglobin: 12.5 g/dL (ref 12.0–15.0)
MCHC: 35.1 g/dL (ref 30.0–36.0)
RBC: 3.73 MIL/uL — ABNORMAL LOW (ref 3.87–5.11)
WBC: 10.1 10*3/uL (ref 4.0–10.5)

## 2011-12-16 LAB — BASIC METABOLIC PANEL
BUN: 11 mg/dL (ref 6–23)
CO2: 26 mEq/L (ref 19–32)
Chloride: 107 mEq/L (ref 96–112)
GFR calc non Af Amer: 90 mL/min — ABNORMAL LOW (ref >90)
Glucose, Bld: 97 mg/dL (ref 70–99)
Potassium: 3.2 mEq/L — ABNORMAL LOW (ref 3.5–5.1)
Sodium: 142 mEq/L (ref 135–145)

## 2011-12-16 NOTE — Progress Notes (Addendum)
[facility]             Progress Note   Subjective: Doing much better, pain improved, slight occasional pain on R side, passing gas, tolerating clears  Objective: Vital signs in last 24 hours: Temp:  [97.5 F (36.4 C)-98.8 F (37.1 C)] 97.5 F (36.4 C) (09/03 0523) Pulse Rate:  [73-92] 75  (09/03 0523) Resp:  [17-18] 18  (09/03 0523) BP: (115-127)/(51-58) 115/58 mmHg (09/03 0523) SpO2:  [94 %-96 %] 96 % (09/03 0523) Weight change:  Last BM Date: 12/13/11  Intake/Output from previous day: 09/02 0701 - 09/03 0700 In: 2187.1 [P.O.:480; I.V.:1197.1; IV Piggyback:510] Out: -  Total I/O In: 120 [P.O.:120] Out: -    Physical Exam: General: Alert, awake, oriented x3, in no acute distress. HEENT: No bruits, no goiter. Heart: Regular rate and rhythm, without murmurs, rubs, gallops. Lungs: basilar crackles Abdomen: Soft, non tender today, less distended, no rigidity or rebound, bowel sounds increased today Extremities: No clubbing cyanosis or edema with positive pedal pulses. Neuro: Grossly intact, nonfocal.    Lab Results: Basic Metabolic Panel:  Basename 12/16/11 0555 12/15/11 0500  NA 142 136  K 3.2* 3.3*  CL 107 104  CO2 26 23  GLUCOSE 97 108*  BUN 11 11  CREATININE 0.65 0.67  CALCIUM 8.1* 8.0*  MG -- --  PHOS -- --   Liver Function Tests:  Basename 12/15/11 0500 12/14/11 0500  AST 31 28  ALT 21 22  ALKPHOS 95 105  BILITOT 2.1* 1.7*  PROT 5.6* 6.4  ALBUMIN 2.8* 3.3*   No results found for this basename: LIPASE:2,AMYLASE:2 in the last 72 hours No results found for this basename: AMMONIA:2 in the last 72 hours CBC:  Basename 12/16/11 0555 12/15/11 0500  WBC 10.1 14.7*  NEUTROABS -- --  HGB 12.5 12.6  HCT 35.6* 37.0  MCV 95.4 95.4  PLT 199 193   Cardiac Enzymes: No results found for this basename: CKTOTAL:3,CKMB:3,CKMBINDEX:3,TROPONINI:3 in the last 72 hours BNP: No results found for this basename: PROBNP:3 in the last 72  hours D-Dimer:  Alvira Philips 12/14/11 2223  DDIMER 1.68*   CBG: No results found for this basename: GLUCAP:6 in the last 72 hours Hemoglobin A1C: No results found for this basename: HGBA1C in the last 72 hours Fasting Lipid Panel: No results found for this basename: CHOL,HDL,LDLCALC,TRIG,CHOLHDL,LDLDIRECT in the last 72 hours Thyroid Function Tests: No results found for this basename: TSH,T4TOTAL,FREET4,T3FREE,THYROIDAB in the last 72 hours Anemia Panel: No results found for this basename: VITAMINB12,FOLATE,FERRITIN,TIBC,IRON,RETICCTPCT in the last 72 hours Coagulation: No results found for this basename: LABPROT:2,INR:2 in the last 72 hours Urine Drug Screen: Drugs of Abuse  No results found for this basename: labopia,  cocainscrnur,  labbenz,  amphetmu,  thcu,  labbarb    Alcohol Level: No results found for this basename: ETH:2 in the last 72 hours Urinalysis: No results found for this basename: COLORURINE:2,APPERANCEUR:2,LABSPEC:2,PHURINE:2,GLUCOSEU:2,HGBUR:2,BILIRUBINUR:2,KETONESUR:2,PROTEINUR:2,UROBILINOGEN:2,NITRITE:2,LEUKOCYTESUR:2 in the last 72 hours  No results found for this or any previous visit (from the past 240 hour(s)).  Studies/Results: Dg Chest Port 1 View  12/14/2011  *RADIOLOGY REPORT*  Clinical Data: Hypoxia  PORTABLE CHEST - 1 VIEW  Comparison: Portable exam 2231 hours compared to 09/24/2011  Findings: Elevation of right diaphragm. Upper normal heart size. Bibasilar atelectasis versus infiltrate greater on right. Bronchitic changes. No definite pleural effusion or pneumothorax.  IMPRESSION: New bibasilar atelectasis versus infiltrate greater on the right. Elevation of right diaphragm. Bronchitic changes.   Original Report Authenticated By: Redge Gainer.  Tyron Russell, M.D.     Medications: Scheduled Meds:    . atenolol  50 mg Oral Daily  . ciprofloxacin  400 mg Intravenous Q12H  . enoxaparin (LOVENOX) injection  40 mg Subcutaneous Q24H  . levothyroxine  75 mcg Oral QAC  breakfast  . metronidazole  500 mg Intravenous Q8H  . pantoprazole (PROTONIX) IV  40 mg Intravenous Q24H  . white petrolatum      . DISCONTD: antiseptic oral rinse  15 mL Mouth Rinse q12n4p  . DISCONTD: chlorhexidine  15 mL Mouth Rinse BID  . DISCONTD: levothyroxine  26 mcg Intravenous Daily   Continuous Infusions:    . sodium chloride 75 mL/hr at 12/15/11 1726   PRN Meds:.acetaminophen, acetaminophen, HYDROmorphone (DILAUDID) injection, ondansetron (ZOFRAN) IV  Assessment/Plan:  1. Pancolitis : Suspect infectious unlikely to be ischemic since she does not have risk factors for this  Clinically improving Continue  IV ciprofloxacin and metronidazole, could transition to PO 9/4 and continue for 10days total Supportive care, cut down IV fluids, antiemetics, narcotics for pain control  Advance to full liquids today Check C. difficile PCR, stool culture , no BM yet for stool studies Last colonoscopy in 2005 showed diffuse diverticulosis  Will need FU with Dr.Sainvil in 3-4 weeks  2. Hypothyroidism: Continue Synthroid   3. leukocytosis secondary to #1, resolved   4. history of palpitations: restarted atenolol    5. Asymptomatic pyuria: already on abx, no value in checking urine culture at this point  6. Atelectasis/hypoxia: incentive spirometry, ambulate, continue to wean off O2  7. DVT prophylaxis with Lovenox   Home in 24-48hours     LOS: 3 days   [facility] Pager: [phone] 12/16/2011, 11:37 AM

## 2011-12-17 DIAGNOSIS — E876 Hypokalemia: Secondary | ICD-10-CM | POA: Diagnosis not present

## 2011-12-17 DIAGNOSIS — J40 Bronchitis, not specified as acute or chronic: Secondary | ICD-10-CM

## 2011-12-17 DIAGNOSIS — K5289 Other specified noninfective gastroenteritis and colitis: Secondary | ICD-10-CM | POA: Diagnosis not present

## 2011-12-17 DIAGNOSIS — D72829 Elevated white blood cell count, unspecified: Secondary | ICD-10-CM | POA: Diagnosis not present

## 2011-12-17 LAB — BASIC METABOLIC PANEL
CO2: 27 mEq/L (ref 19–32)
Calcium: 8.1 mg/dL — ABNORMAL LOW (ref 8.4–10.5)
GFR calc non Af Amer: 89 mL/min — ABNORMAL LOW (ref >90)
Potassium: 2.3 mEq/L — CL (ref 3.5–5.1)
Sodium: 137 mEq/L (ref 135–145)

## 2011-12-17 LAB — MAGNESIUM: Magnesium: 1.9 mg/dL (ref 1.5–2.5)

## 2011-12-17 LAB — CLOSTRIDIUM DIFFICILE BY PCR: Toxigenic C. Difficile by PCR: NEGATIVE

## 2011-12-17 MED ORDER — GUAIFENESIN 100 MG/5ML PO SYRP
200.0000 mg | ORAL_SOLUTION | ORAL | Status: DC | PRN
  Filled 2011-12-17: qty 10

## 2011-12-17 MED ORDER — POTASSIUM CHLORIDE 10 MEQ/100ML IV SOLN
10.0000 meq | INTRAVENOUS | Status: AC
  Administered 2011-12-17 (×4): 10 meq via INTRAVENOUS
  Filled 2011-12-17: qty 400

## 2011-12-17 MED ORDER — POTASSIUM CHLORIDE CRYS ER 20 MEQ PO TBCR
40.0000 meq | EXTENDED_RELEASE_TABLET | Freq: Two times a day (BID) | ORAL | Status: AC
  Administered 2011-12-17 (×2): 40 meq via ORAL
  Filled 2011-12-17 (×2): qty 2

## 2011-12-17 MED ORDER — GUAIFENESIN 100 MG/5ML PO SYRP
200.0000 mg | ORAL_SOLUTION | ORAL | Status: DC | PRN
  Filled 2011-12-17: qty 118

## 2011-12-17 MED ORDER — METRONIDAZOLE 500 MG PO TABS
500.0000 mg | ORAL_TABLET | Freq: Three times a day (TID) | ORAL | Status: DC
  Administered 2011-12-17 – 2011-12-18 (×4): 500 mg via ORAL
  Filled 2011-12-17 (×6): qty 1

## 2011-12-17 MED ORDER — CIPROFLOXACIN HCL 500 MG PO TABS
500.0000 mg | ORAL_TABLET | Freq: Two times a day (BID) | ORAL | Status: DC
  Administered 2011-12-17 – 2011-12-18 (×2): 500 mg via ORAL
  Filled 2011-12-17 (×4): qty 1

## 2011-12-17 MED ORDER — POTASSIUM CHLORIDE 10 MEQ/100ML IV SOLN: 10.0000 meq | INTRAVENOUS | Status: DC

## 2011-12-17 NOTE — Care Management Note (Signed)
  Page 1 of 1   12/17/2011     10:31:37 AM   CARE MANAGEMENT NOTE 12/17/2011  Patient:  Toni Rich, Toni Rich   Account Number:  [id number]  Date Initiated:  12/17/2011  Documentation initiated by:  Ronny Flurry  Subjective/Objective Assessment:   DX: colitis     Action/Plan:   12-17-11 advanced to full liquid iet today . Home in 24 to 48 hours   Anticipated DC Date:  12/19/2011   Anticipated DC Plan:  HOME/SELF CARE         Choice offered to / List presented to:             Status of service:  In process, will continue to follow Medicare Important Message given?   (If response is "NO", the following Medicare IM given date fields will be blank) Date Medicare IM given:   Date Additional Medicare IM given:    Discharge Disposition:    Per UR Regulation:  Reviewed for med. necessity/level of care/duration of stay  If discussed at Long Length of Stay Meetings, dates discussed:    Comments:

## 2011-12-17 NOTE — Progress Notes (Signed)
[facility]             Progress Note   Subjective: Doing much better, pain improved, slight occasional pain on R side, passing gas, tolerating regular diet.   Objective: Vital signs in last 24 hours: Temp:  [98 F (36.7 C)-98.8 F (37.1 C)] 98 F (36.7 C) (09/04 0525) Pulse Rate:  [68-75] 69  (09/04 0525) Resp:  [18-20] 18  (09/04 0525) BP: (98-125)/(49-65) 98/51 mmHg (09/04 0525) SpO2:  [91 %-98 %] 98 % (09/04 0525) Weight change:  Last BM Date: 12/13/11  Intake/Output from previous day: 09/03 0701 - 09/04 0700 In: 1681 [P.O.:360; I.V.:671; IV Piggyback:650] Out: -      Physical Exam: General: Alert, awake, oriented x3, in no acute distress. HEENT: No bruits, no goiter. Heart: Regular rate and rhythm, without murmurs, rubs, gallops. Lungs: basilar crackles Abdomen: Soft, mildly tender in the RL quadrant and RU quadrant, abdomen is  less distended, no rigidity or rebound, bowel sounds increased today Extremities: No clubbing cyanosis or edema with positive pedal pulses. Neuro: Grossly intact, nonfocal.    Lab Results: Basic Metabolic Panel:  Basename 12/16/11 0555 12/15/11 0500  NA 142 136  K 3.2* 3.3*  CL 107 104  CO2 26 23  GLUCOSE 97 108*  BUN 11 11  CREATININE 0.65 0.67  CALCIUM 8.1* 8.0*  MG -- --  PHOS -- --   Liver Function Tests:  Basename 12/15/11 0500  AST 31  ALT 21  ALKPHOS 95  BILITOT 2.1*  PROT 5.6*  ALBUMIN 2.8*   No results found for this basename: LIPASE:2,AMYLASE:2 in the last 72 hours No results found for this basename: AMMONIA:2 in the last 72 hours CBC:  Basename 12/16/11 0555 12/15/11 0500  WBC 10.1 14.7*  NEUTROABS -- --  HGB 12.5 12.6  HCT 35.6* 37.0  MCV 95.4 95.4  PLT 199 193   Cardiac Enzymes: No results found for this basename: CKTOTAL:3,CKMB:3,CKMBINDEX:3,TROPONINI:3 in the last 72 hours BNP: No results found for this basename: PROBNP:3 in the last 72 hours D-Dimer:  Alvira Philips 12/14/11 2223    DDIMER 1.68*   CBG: No results found for this basename: GLUCAP:6 in the last 72 hours Hemoglobin A1C: No results found for this basename: HGBA1C in the last 72 hours Fasting Lipid Panel: No results found for this basename: CHOL,HDL,LDLCALC,TRIG,CHOLHDL,LDLDIRECT in the last 72 hours Thyroid Function Tests: No results found for this basename: TSH,T4TOTAL,FREET4,T3FREE,THYROIDAB in the last 72 hours Anemia Panel: No results found for this basename: VITAMINB12,FOLATE,FERRITIN,TIBC,IRON,RETICCTPCT in the last 72 hours Coagulation: No results found for this basename: LABPROT:2,INR:2 in the last 72 hours Urine Drug Screen: Drugs of Abuse  No results found for this basename: labopia,  cocainscrnur,  labbenz,  amphetmu,  thcu,  labbarb    Alcohol Level: No results found for this basename: ETH:2 in the last 72 hours Urinalysis: No results found for this basename: COLORURINE:2,APPERANCEUR:2,LABSPEC:2,PHURINE:2,GLUCOSEU:2,HGBUR:2,BILIRUBINUR:2,KETONESUR:2,PROTEINUR:2,UROBILINOGEN:2,NITRITE:2,LEUKOCYTESUR:2 in the last 72 hours  Recent Results (from the past 240 hour(s))  CLOSTRIDIUM DIFFICILE BY PCR     Status: Normal   Collection Time   12/16/11  9:19 PM      Component Value Range Status Comment   C difficile by pcr NEGATIVE  NEGATIVE Final     Studies/Results: No results found.  Medications: Scheduled Meds:    . atenolol  50 mg Oral Daily  . ciprofloxacin  400 mg Intravenous Q12H  . enoxaparin (LOVENOX) injection  40 mg Subcutaneous Q24H  . levothyroxine  75 mcg Oral QAC breakfast  .  metronidazole  500 mg Intravenous Q8H  . pantoprazole (PROTONIX) IV  40 mg Intravenous Q24H   Continuous Infusions:    . sodium chloride 20 mL/hr (12/16/11 1957)   PRN Meds:.acetaminophen, acetaminophen, HYDROmorphone (DILAUDID) injection, ondansetron (ZOFRAN) IV  Assessment/Plan:  1. Pancolitis : Suspect infectious unlikely to be ischemic since she does not have risk factors for this   Clinically improving, but has ongoing diarrhea. Change to po antibiotics today.  Supportive care, antiemetics, narcotics for pain control   C. difficile PCR, negative.  Last colonoscopy in 2005 showed diffuse diverticulosis  Will need FU with Dr.Blish in 3-4 weeks  2. Hypothyroidism: Continue Synthroid   3. leukocytosis secondary to #1, resolved   4. history of palpitations: restarted atenolol    5. Asymptomatic pyuria: already on abx, no value in checking urine culture at this point  6. Atelectasis/hypoxia: incentive spirometry, ambulate, continue to wean off O2  7. DVT prophylaxis with Lovenox   8. Hypokalemia: iv k and po k ordered. Recheck later today.       LOS: 4 days   , [facility] Pager: [phone] 12/17/2011, 8:56 AM

## 2011-12-18 DIAGNOSIS — J40 Bronchitis, not specified as acute or chronic: Secondary | ICD-10-CM | POA: Diagnosis not present

## 2011-12-18 DIAGNOSIS — E876 Hypokalemia: Secondary | ICD-10-CM | POA: Diagnosis not present

## 2011-12-18 DIAGNOSIS — D72829 Elevated white blood cell count, unspecified: Secondary | ICD-10-CM | POA: Diagnosis not present

## 2011-12-18 DIAGNOSIS — K5289 Other specified noninfective gastroenteritis and colitis: Secondary | ICD-10-CM | POA: Diagnosis not present

## 2011-12-18 LAB — BASIC METABOLIC PANEL
Calcium: 8.2 mg/dL — ABNORMAL LOW (ref 8.4–10.5)
GFR calc non Af Amer: 89 mL/min — ABNORMAL LOW (ref >90)
Potassium: 3.1 mEq/L — ABNORMAL LOW (ref 3.5–5.1)
Sodium: 137 mEq/L (ref 135–145)

## 2011-12-18 MED ORDER — GUAIFENESIN 100 MG/5ML PO SYRP: 200.0000 mg | ORAL_SOLUTION | Freq: Three times a day (TID) | ORAL | Status: AC | PRN

## 2011-12-18 MED ORDER — POTASSIUM CHLORIDE 10 MEQ/100ML IV SOLN
10.0000 meq | INTRAVENOUS | Status: AC
  Administered 2011-12-18 (×2): 10 meq via INTRAVENOUS
  Filled 2011-12-18: qty 200

## 2011-12-18 MED ORDER — METRONIDAZOLE 500 MG PO TABS: 500.0000 mg | ORAL_TABLET | Freq: Three times a day (TID) | ORAL | Status: AC

## 2011-12-18 MED ORDER — POTASSIUM CHLORIDE CRYS ER 20 MEQ PO TBCR
40.0000 meq | EXTENDED_RELEASE_TABLET | ORAL | Status: DC
  Administered 2011-12-18 (×2): 40 meq via ORAL
  Filled 2011-12-18 (×4): qty 2

## 2011-12-18 MED ORDER — POTASSIUM CHLORIDE CRYS ER 20 MEQ PO TBCR: 20.0000 meq | EXTENDED_RELEASE_TABLET | Freq: Every day | ORAL | Status: DC | PRN

## 2011-12-18 MED ORDER — PANTOPRAZOLE SODIUM 40 MG PO TBEC
40.0000 mg | DELAYED_RELEASE_TABLET | Freq: Every day | ORAL | Status: DC
  Administered 2011-12-18: 40 mg via ORAL
  Filled 2011-12-18: qty 1

## 2011-12-18 MED ORDER — CIPROFLOXACIN HCL 500 MG PO TABS: 500.0000 mg | ORAL_TABLET | Freq: Two times a day (BID) | ORAL | Status: AC

## 2011-12-18 MED ORDER — OXYCODONE HCL 5 MG PO TABS: 5.0000 mg | ORAL_TABLET | Freq: Three times a day (TID) | ORAL | Status: AC | PRN

## 2011-12-18 NOTE — Discharge Summary (Signed)
Physician Discharge Summary  Toni Rich ZOX:[id number] DOB: 15-Aug-1945 DOA: 12/13/2011  PCP: No primary provider on file.  Admit date: 12/13/2011 Discharge date: 12/18/2011  Recommendations for Outpatient Follow-up:  1. Follow up with PCP on Monday and get a bmp.   Discharge Diagnoses:  Active Problems:  Colitis  Leukocytosis  Bilateral atelectasis  Hypokalemia  Bronchitis   Discharge Condition: stable  Diet recommendation: bland diet for 1 week followed by low sodium diet  Filed Weights   12/13/11 1023 12/13/11 1725  Weight: 67.132 kg (148 lb) 67.132 kg (148 lb)    History of present illness:  Toni Rich is a 66 year old female with history of irritable bowel syndrome reports that she had some mild abdominal pain on Wednesday which resolved with some dicyclomine. Subsequently woke up this morning around 2:00am with diffuse crampy abdominal pain associated with nausea and vomiting.  Since then has been throwing up pretty much anything she tried to eat or drink. Vomitus is described as nonbloody nonbilious.  Denies any diarrhea. Also denies fevers or chills  Subsequently evaluation the emergency room noted colitis and mild leukocytosis.  Patient denies any sick contacts, denies eating uncooked or unusual food or seafood.  Suspects she was treated with an antibiotic or an antifungal for a suspected yeast infection 2 or 3 weeks ago   Hospital Course:  1. Pancolitis : Suspect infectious  unlikely to be ischemic since she does not have risk factors for this  Clinically improving, and diarrhea is improving with more formed stool.  Changed to po antibiotics yesterday   C. difficile PCR, negative.  Last colonoscopy in 2005 showed diffuse diverticulosis  Will need FU with Dr.Wynter in 3-4 weeks  2. Hypothyroidism: Continue Synthroid  3. leukocytosis secondary to #1, resolved  4. history of palpitations: restarted atenolol  5. Asymptomatic pyuria: already on abx, no value in  checking urine culture at this point . She will continue with cipro.  6. Atelectasis/hypoxia: incentive spirometry, ambulated.  7. Bronchitis: improving.  8. Hypokalemia: iv k and po k given. Recommend rechecking BMP on Monday.   Procedures:  CT ABD AND PELVIS:  1.Colitis which involves the distal ascending colon, transverse colon, descending colon, sigmoid colon, and rectum. The proximal ascending colon and cecum are spared. 2. Diffuse colonic diverticulosis without evidence of acute diverticulitis. 3. Mild diffuse hepatic steatosis without focal hepatic parenchymal abnormality. 4. Small calcified gallstone which is either in the gallbladder neck or the cystic duct. No evidence of acute cholecystitis by CT. 5. Duodenal diverticulum. 6. Left ovarian varicocele.     Consultations:  none  Discharge Exam: Filed Vitals:   12/18/11 0619  BP: 112/71  Pulse: 62  Temp: 98.9 F (37.2 C)  Resp: 18   Filed Vitals:   12/16/11 2210 12/17/11 0525 12/17/11 2213 12/18/11 0619  BP: 121/49 98/51 100/58 112/71  Pulse: 75 69 70 62  Temp: 98.7 F (37.1 C) 98 F (36.7 C) 98.2 F (36.8 C) 98.9 F (37.2 C)  TempSrc: Oral     Resp: 20 18 20 18   Height:      Weight:      SpO2: 96% 98% 99% 98%   Physical Exam:  General: Alert, awake, oriented x3, in no acute distress.  HEENT: No bruits, no goiter.  Heart: Regular rate and rhythm, without murmurs, rubs, gallops.  Lungs: basilar crackles  Abdomen: Soft, no tenderness, abdomen is less distended, no rigidity or rebound, bowel sounds increased today  Extremities: No clubbing cyanosis or edema  with positive pedal pulses.  Neuro: Grossly intact, nonfocal.   Discharge Instructions  Discharge Orders    Future Orders Please Complete By Expires   Diet general      Discharge instructions      Comments:   Follow up with PCP on Monday with a repeat BMP.   Activity as tolerated - No restrictions        Medication List  As of 12/18/2011   2:31 PM   TAKE these medications         atenolol 50 MG tablet   Commonly known as: TENORMIN   Take 50 mg by mouth daily.      CALCIUM 600 + D PO   Take 1 tablet by mouth daily.      cholecalciferol 1000 UNITS tablet   Commonly known as: VITAMIN D   Take 2,000 Units by mouth daily.      ciprofloxacin 500 MG tablet   Commonly known as: CIPRO   Take 1 tablet (500 mg total) by mouth 2 (two) times daily.      dicyclomine 20 MG tablet   Commonly known as: BENTYL   Take 20 mg by mouth as needed. For acid reflux.      estradiol 1 MG tablet   Commonly known as: ESTRACE   Take 1 mg by mouth daily.      gabapentin 600 MG tablet   Commonly known as: NEURONTIN   Take 600 mg by mouth every morning.      guaifenesin 100 MG/5ML syrup   Commonly known as: ROBITUSSIN   Take 10 mLs (200 mg total) by mouth 3 (three) times daily as needed for congestion.      levothyroxine 75 MCG tablet   Commonly known as: SYNTHROID, LEVOTHROID   Take 75 mcg by mouth daily.      LORazepam 1 MG tablet   Commonly known as: ATIVAN   Take 1 mg by mouth every 8 (eight) hours.      medroxyPROGESTERone 5 MG tablet   Commonly known as: PROVERA   Take 5 mg by mouth daily.      metroNIDAZOLE 500 MG tablet   Commonly known as: FLAGYL   Take 1 tablet (500 mg total) by mouth every 8 (eight) hours.      MULTIVITAMIN PO   Take 1 tablet by mouth daily.      omeprazole 20 MG capsule   Commonly known as: PRILOSEC   Take 20 mg by mouth as needed. For acid reflux.      oxyCODONE 5 MG immediate release tablet   Commonly known as: Oxy IR/ROXICODONE   Take 1 tablet (5 mg total) by mouth every 8 (eight) hours as needed for pain.      potassium chloride SA 20 MEQ tablet   Commonly known as: K-DUR,KLOR-CON   Take 1 tablet (20 mEq total) by mouth daily as needed.      simvastatin 40 MG tablet   Commonly known as: ZOCOR   Take 40 mg by mouth every evening.      VALTREX 1000 MG tablet   Generic drug:  valACYclovir   Take 1,000 mg by mouth 2 (two) times daily.              The results of significant diagnostics from this hospitalization (including imaging, microbiology, ancillary and laboratory) are listed below for reference.    Significant Diagnostic Studies: Ct Abdomen Pelvis W Contrast  12/13/2011  *RADIOLOGY REPORT*  Clinical Data: Abdominal  pain.  Leukocytosis with white blood count 12,100.  Nausea and vomiting.  Surgical history includes tubal ligation and cesarean section.  The prior history of diverticulitis.  CT ABDOMEN AND PELVIS WITH CONTRAST  Technique:  Multidetector CT imaging of the abdomen and pelvis was performed following the standard protocol during bolus administration of intravenous contrast.  Contrast: OMNIPAQUE IOHEXOL 300 MG/ML.  Oral contrast was also administered.  Comparison: CT abdomen and pelvis 10/16/2005, 11/15/2004.  Findings: Wall thickening with enhancement and adjacent edema/inflammation involving the colon beginning at the level of the distal ascending colon and extending continuously to the rectum.  Scattered diverticula noted throughout the colon, greatest in the sigmoid region, without evidence of acute diverticulitis. Cecum and proximal ascending colon normal in appearance.  Normal appendix in the right upper pelvis.  Diverticulum arising from the medial aspect of the junction of the second and third duodenum. Normal appearing small bowel otherwise.  Stomach normal in appearance, mildly distended with the ingested oral contrast material.  No ascites.  Mild diffuse hepatic steatosis without focal hepatic parenchymal abnormality.  Normal-appearing spleen, pancreas, adrenal glands, and kidneys.  Small calcified gallstone in the neck of the gallbladder or within the cystic duct; no CT evidence for acute cholecystitis.  No biliary ductal dilation.  No common duct stone. Mild aorto-iliac atherosclerosis.  Patent visceral arteries, with mild stenosis at the  origin of the celiac artery.  No significant lymphadenopathy.  Uterus atrophic consistent with age.  Prominent vessels in the left adnexa, consistent with left ovarian varicocele, as the left ovarian vein is dilated throughout its length.  No adnexal masses. No free pelvic fluid.  Urinary bladder decompressed and unremarkable.  Bone window images demonstrate severe degenerative disc disease at L5-S1, lower thoracic spondylosis, and facet degenerative changes involving the lower lumbar spine.  Visualized lung bases clear apart from the expected dependent atelectasis posteriorly.  Heart mildly enlarged.  IMPRESSION:  1.  Colitis which involves the distal ascending colon, transverse colon, descending colon, sigmoid colon, and rectum.  The proximal ascending colon and cecum are spared. 2.  Diffuse colonic diverticulosis without evidence of acute diverticulitis. 3.  Mild diffuse hepatic steatosis without focal hepatic parenchymal abnormality. 4.  Small calcified gallstone which is either in the gallbladder neck or the cystic duct.  No evidence of acute cholecystitis by CT. 5.  Duodenal diverticulum. 6.  Left ovarian varicocele.   Original Report Authenticated By: Arnell Sieving, M.D.    Dg Chest Port 1 View  12/14/2011  *RADIOLOGY REPORT*  Clinical Data: Hypoxia  PORTABLE CHEST - 1 VIEW  Comparison: Portable exam 2231 hours compared to 09/24/2011  Findings: Elevation of right diaphragm. Upper normal heart size. Bibasilar atelectasis versus infiltrate greater on right. Bronchitic changes. No definite pleural effusion or pneumothorax.  IMPRESSION: New bibasilar atelectasis versus infiltrate greater on the right. Elevation of right diaphragm. Bronchitic changes.   Original Report Authenticated By: Lollie Marrow, M.D.     Microbiology: Recent Results (from the past 240 hour(s))  CLOSTRIDIUM DIFFICILE BY PCR     Status: Normal   Collection Time   12/16/11  9:19 PM      Component Value Range Status Comment   C  difficile by pcr NEGATIVE  NEGATIVE Final      Labs: Basic Metabolic Panel:  Lab 12/18/11 1610 12/17/11 1359 12/16/11 0555 12/15/11 0500 12/14/11 0500  NA 137 137 142 136 137  K 3.1* 2.3* 3.2* 3.3* 3.6  CL 104 102 107 104 103  CO2 23 27 26 23 23   GLUCOSE 89 94 97 108* 126*  BUN 10 10 11 11 10   CREATININE 0.66 0.67 0.65 0.67 0.70  CALCIUM 8.2* 8.1* 8.1* 8.0* 8.1*  MG -- 1.9 -- -- --  PHOS -- -- -- -- --   Liver Function Tests:  Lab 12/15/11 0500 12/14/11 0500 12/13/11 1050  AST 31 28 26   ALT 21 22 25   ALKPHOS 95 105 119*  BILITOT 2.1* 1.7* 1.4*  PROT 5.6* 6.4 7.4  ALBUMIN 2.8* 3.3* 4.3    Lab 12/13/11 1050  LIPASE 26  AMYLASE --   No results found for this basename: AMMONIA:5 in the last 168 hours CBC:  Lab 12/16/11 0555 12/15/11 0500 12/14/11 0500 12/13/11 1835 12/13/11 1050  WBC 10.1 14.7* 16.7* 13.9* 12.1*  NEUTROABS -- -- -- -- 10.5*  HGB 12.5 12.6 13.7 13.8 14.3  HCT 35.6* 37.0 39.5 39.8 40.8  MCV 95.4 95.4 95.6 95.7 95.1  PLT 199 193 228 200 239   Cardiac Enzymes: No results found for this basename: CKTOTAL:5,CKMB:5,CKMBINDEX:5,TROPONINI:5 in the last 168 hours BNP: BNP (last 3 results) No results found for this basename: PROBNP:3 in the last 8760 hours CBG: No results found for this basename: GLUCAP:5 in the last 168 hours  Time coordinating discharge:45 minutes  Signed:  ,  [facility] 12/18/2011, 2:31 PM

## 2011-12-18 NOTE — Progress Notes (Signed)
PHARMACIST - PHYSICIAN COMMUNICATION  CONCERNING: Protonix IV to Oral Route Change Policy  RECOMMENDATION: This patient is receiving Protonix by the intravenous route.  Based on criteria approved by the Pharmacy and Therapeutics Committee, this is being converted to the equivalent oral dose form(s).   DESCRIPTION: These criteria include:  The patient is not neutropenic and does not exhibit a GI malabsorption state. Did not have GIB this admit.  The patient is eating (either orally or via tube) and/or has been taking other orally administered medications for a least 24 hours  If you have questions about this conversion, please contact the Pharmacy Department   K. Allena Katz, PharmD, BCPS.  Clinical Pharmacist Pager [phone]. 12/18/2011 7:40 AM

## 2011-12-22 DIAGNOSIS — E876 Hypokalemia: Secondary | ICD-10-CM | POA: Diagnosis not present

## 2011-12-23 DIAGNOSIS — E875 Hyperkalemia: Secondary | ICD-10-CM | POA: Diagnosis not present

## 2011-12-24 DIAGNOSIS — Z86010 Personal history of colonic polyps: Secondary | ICD-10-CM | POA: Diagnosis not present

## 2011-12-24 DIAGNOSIS — K5289 Other specified noninfective gastroenteritis and colitis: Secondary | ICD-10-CM | POA: Diagnosis not present

## 2012-01-02 DIAGNOSIS — E875 Hyperkalemia: Secondary | ICD-10-CM | POA: Diagnosis not present

## 2012-01-14 DIAGNOSIS — K5289 Other specified noninfective gastroenteritis and colitis: Secondary | ICD-10-CM | POA: Diagnosis not present

## 2012-01-14 DIAGNOSIS — Z86010 Personal history of colonic polyps: Secondary | ICD-10-CM | POA: Diagnosis not present

## 2012-01-15 DIAGNOSIS — G43019 Migraine without aura, intractable, without status migrainosus: Secondary | ICD-10-CM | POA: Diagnosis not present

## 2012-02-27 DIAGNOSIS — Z23 Encounter for immunization: Secondary | ICD-10-CM | POA: Diagnosis not present

## 2012-02-27 DIAGNOSIS — M533 Sacrococcygeal disorders, not elsewhere classified: Secondary | ICD-10-CM | POA: Diagnosis not present

## 2012-02-27 DIAGNOSIS — M76899 Other specified enthesopathies of unspecified lower limb, excluding foot: Secondary | ICD-10-CM | POA: Diagnosis not present

## 2012-03-03 DIAGNOSIS — F528 Other sexual dysfunction not due to a substance or known physiological condition: Secondary | ICD-10-CM | POA: Diagnosis not present

## 2012-03-29 DIAGNOSIS — I7300 Raynaud's syndrome without gangrene: Secondary | ICD-10-CM | POA: Diagnosis not present

## 2012-05-07 DIAGNOSIS — F411 Generalized anxiety disorder: Secondary | ICD-10-CM | POA: Diagnosis not present

## 2012-05-07 DIAGNOSIS — R002 Palpitations: Secondary | ICD-10-CM | POA: Diagnosis not present

## 2012-05-07 DIAGNOSIS — M25569 Pain in unspecified knee: Secondary | ICD-10-CM | POA: Diagnosis not present

## 2012-07-09 ENCOUNTER — Other Ambulatory Visit (HOSPITAL_COMMUNITY): Payer: Self-pay | Admitting: Obstetrics and Gynecology

## 2012-07-09 DIAGNOSIS — Z1231 Encounter for screening mammogram for malignant neoplasm of breast: Secondary | ICD-10-CM

## 2012-07-12 DIAGNOSIS — E039 Hypothyroidism, unspecified: Secondary | ICD-10-CM | POA: Diagnosis not present

## 2012-07-12 DIAGNOSIS — K219 Gastro-esophageal reflux disease without esophagitis: Secondary | ICD-10-CM | POA: Diagnosis not present

## 2012-07-12 DIAGNOSIS — M2550 Pain in unspecified joint: Secondary | ICD-10-CM | POA: Diagnosis not present

## 2012-07-12 DIAGNOSIS — F411 Generalized anxiety disorder: Secondary | ICD-10-CM | POA: Diagnosis not present

## 2012-07-12 DIAGNOSIS — R002 Palpitations: Secondary | ICD-10-CM | POA: Diagnosis not present

## 2012-07-12 DIAGNOSIS — E7800 Pure hypercholesterolemia, unspecified: Secondary | ICD-10-CM | POA: Diagnosis not present

## 2012-07-22 DIAGNOSIS — G43019 Migraine without aura, intractable, without status migrainosus: Secondary | ICD-10-CM | POA: Diagnosis not present

## 2012-08-16 ENCOUNTER — Ambulatory Visit (HOSPITAL_COMMUNITY): Payer: Medicare Other

## 2012-08-19 ENCOUNTER — Ambulatory Visit (HOSPITAL_COMMUNITY)
Admission: RE | Admit: 2012-08-19 | Discharge: 2012-08-19 | Disposition: A | Discharge status: Home / Self Care | Payer: Medicare Other | Source: Ambulatory Visit | Attending: Obstetrics and Gynecology | Admitting: Obstetrics and Gynecology

## 2012-08-19 DIAGNOSIS — Z1231 Encounter for screening mammogram for malignant neoplasm of breast: Secondary | ICD-10-CM | POA: Insufficient documentation

## 2012-08-23 DIAGNOSIS — L259 Unspecified contact dermatitis, unspecified cause: Secondary | ICD-10-CM | POA: Diagnosis not present

## 2012-08-23 DIAGNOSIS — M76899 Other specified enthesopathies of unspecified lower limb, excluding foot: Secondary | ICD-10-CM | POA: Diagnosis not present

## 2012-09-29 DIAGNOSIS — R350 Frequency of micturition: Secondary | ICD-10-CM | POA: Diagnosis not present

## 2013-01-01 DIAGNOSIS — H353190 Nonexudative age-related macular degeneration, unspecified eye, stage unspecified: Secondary | ICD-10-CM | POA: Diagnosis not present

## 2013-01-01 DIAGNOSIS — H5200 Hypermetropia, unspecified eye: Secondary | ICD-10-CM | POA: Diagnosis not present

## 2013-01-01 DIAGNOSIS — H2510 Age-related nuclear cataract, unspecified eye: Secondary | ICD-10-CM | POA: Diagnosis not present

## 2013-01-01 DIAGNOSIS — H524 Presbyopia: Secondary | ICD-10-CM | POA: Diagnosis not present

## 2013-01-12 DIAGNOSIS — G43719 Chronic migraine without aura, intractable, without status migrainosus: Secondary | ICD-10-CM | POA: Diagnosis not present

## 2013-01-12 DIAGNOSIS — G43019 Migraine without aura, intractable, without status migrainosus: Secondary | ICD-10-CM | POA: Diagnosis not present

## 2013-01-12 DIAGNOSIS — Z79899 Other long term (current) drug therapy: Secondary | ICD-10-CM | POA: Diagnosis not present

## 2013-02-21 DIAGNOSIS — Z23 Encounter for immunization: Secondary | ICD-10-CM | POA: Diagnosis not present

## 2013-02-21 DIAGNOSIS — E039 Hypothyroidism, unspecified: Secondary | ICD-10-CM | POA: Diagnosis not present

## 2013-02-21 DIAGNOSIS — E7800 Pure hypercholesterolemia, unspecified: Secondary | ICD-10-CM | POA: Diagnosis not present

## 2013-02-21 DIAGNOSIS — R002 Palpitations: Secondary | ICD-10-CM | POA: Diagnosis not present

## 2013-02-21 DIAGNOSIS — F411 Generalized anxiety disorder: Secondary | ICD-10-CM | POA: Diagnosis not present

## 2013-02-21 DIAGNOSIS — K219 Gastro-esophageal reflux disease without esophagitis: Secondary | ICD-10-CM | POA: Diagnosis not present

## 2013-02-21 DIAGNOSIS — G43909 Migraine, unspecified, not intractable, without status migrainosus: Secondary | ICD-10-CM | POA: Diagnosis not present

## 2013-03-25 DIAGNOSIS — Z01419 Encounter for gynecological examination (general) (routine) without abnormal findings: Secondary | ICD-10-CM | POA: Diagnosis not present

## 2013-03-25 DIAGNOSIS — Z79890 Hormone replacement therapy (postmenopausal): Secondary | ICD-10-CM | POA: Diagnosis not present

## 2013-03-25 DIAGNOSIS — Z0000 Encounter for general adult medical examination without abnormal findings: Secondary | ICD-10-CM | POA: Diagnosis not present

## 2013-06-02 DIAGNOSIS — R002 Palpitations: Secondary | ICD-10-CM | POA: Diagnosis not present

## 2013-06-02 DIAGNOSIS — F411 Generalized anxiety disorder: Secondary | ICD-10-CM | POA: Diagnosis not present

## 2013-06-22 DIAGNOSIS — G43719 Chronic migraine without aura, intractable, without status migrainosus: Secondary | ICD-10-CM | POA: Diagnosis not present

## 2013-06-22 DIAGNOSIS — G43019 Migraine without aura, intractable, without status migrainosus: Secondary | ICD-10-CM | POA: Diagnosis not present

## 2013-06-27 DIAGNOSIS — J40 Bronchitis, not specified as acute or chronic: Secondary | ICD-10-CM | POA: Diagnosis not present

## 2013-06-27 DIAGNOSIS — E039 Hypothyroidism, unspecified: Secondary | ICD-10-CM | POA: Diagnosis not present

## 2013-06-27 DIAGNOSIS — E7800 Pure hypercholesterolemia, unspecified: Secondary | ICD-10-CM | POA: Diagnosis not present

## 2013-06-27 DIAGNOSIS — F411 Generalized anxiety disorder: Secondary | ICD-10-CM | POA: Diagnosis not present

## 2013-06-27 DIAGNOSIS — K219 Gastro-esophageal reflux disease without esophagitis: Secondary | ICD-10-CM | POA: Diagnosis not present

## 2013-06-27 DIAGNOSIS — R002 Palpitations: Secondary | ICD-10-CM | POA: Diagnosis not present

## 2013-08-01 DIAGNOSIS — Z09 Encounter for follow-up examination after completed treatment for conditions other than malignant neoplasm: Secondary | ICD-10-CM | POA: Diagnosis not present

## 2013-08-01 DIAGNOSIS — Z86010 Personal history of colonic polyps: Secondary | ICD-10-CM | POA: Diagnosis not present

## 2013-08-11 DIAGNOSIS — M542 Cervicalgia: Secondary | ICD-10-CM | POA: Diagnosis not present

## 2013-08-11 DIAGNOSIS — G43019 Migraine without aura, intractable, without status migrainosus: Secondary | ICD-10-CM | POA: Diagnosis not present

## 2013-08-11 DIAGNOSIS — G518 Other disorders of facial nerve: Secondary | ICD-10-CM | POA: Diagnosis not present

## 2013-08-11 DIAGNOSIS — IMO0001 Reserved for inherently not codable concepts without codable children: Secondary | ICD-10-CM | POA: Diagnosis not present

## 2013-08-22 DIAGNOSIS — Z1231 Encounter for screening mammogram for malignant neoplasm of breast: Secondary | ICD-10-CM | POA: Diagnosis not present

## 2013-08-29 DIAGNOSIS — R059 Cough, unspecified: Secondary | ICD-10-CM | POA: Diagnosis not present

## 2013-08-29 DIAGNOSIS — Z23 Encounter for immunization: Secondary | ICD-10-CM | POA: Diagnosis not present

## 2013-08-29 DIAGNOSIS — F411 Generalized anxiety disorder: Secondary | ICD-10-CM | POA: Diagnosis not present

## 2013-08-29 DIAGNOSIS — R05 Cough: Secondary | ICD-10-CM | POA: Diagnosis not present

## 2013-11-17 ENCOUNTER — Encounter: Payer: Self-pay | Admitting: *Deleted

## 2013-11-23 DIAGNOSIS — G43019 Migraine without aura, intractable, without status migrainosus: Secondary | ICD-10-CM | POA: Diagnosis not present

## 2013-11-23 DIAGNOSIS — G43719 Chronic migraine without aura, intractable, without status migrainosus: Secondary | ICD-10-CM | POA: Diagnosis not present

## 2013-11-28 DIAGNOSIS — E7800 Pure hypercholesterolemia, unspecified: Secondary | ICD-10-CM | POA: Diagnosis not present

## 2013-11-28 DIAGNOSIS — E039 Hypothyroidism, unspecified: Secondary | ICD-10-CM | POA: Diagnosis not present

## 2013-11-28 DIAGNOSIS — F411 Generalized anxiety disorder: Secondary | ICD-10-CM | POA: Diagnosis not present

## 2013-11-28 DIAGNOSIS — M545 Low back pain, unspecified: Secondary | ICD-10-CM | POA: Diagnosis not present

## 2014-02-02 DIAGNOSIS — F411 Generalized anxiety disorder: Secondary | ICD-10-CM | POA: Diagnosis not present

## 2014-02-02 DIAGNOSIS — Z23 Encounter for immunization: Secondary | ICD-10-CM | POA: Diagnosis not present

## 2014-03-11 DIAGNOSIS — H524 Presbyopia: Secondary | ICD-10-CM | POA: Diagnosis not present

## 2014-03-11 DIAGNOSIS — H5203 Hypermetropia, bilateral: Secondary | ICD-10-CM | POA: Diagnosis not present

## 2014-03-11 DIAGNOSIS — H2513 Age-related nuclear cataract, bilateral: Secondary | ICD-10-CM | POA: Diagnosis not present

## 2014-04-18 DIAGNOSIS — Z79890 Hormone replacement therapy (postmenopausal): Secondary | ICD-10-CM | POA: Diagnosis not present

## 2014-04-18 DIAGNOSIS — Z01419 Encounter for gynecological examination (general) (routine) without abnormal findings: Secondary | ICD-10-CM | POA: Diagnosis not present

## 2014-04-18 DIAGNOSIS — Z0000 Encounter for general adult medical examination without abnormal findings: Secondary | ICD-10-CM | POA: Diagnosis not present

## 2014-04-27 DIAGNOSIS — F411 Generalized anxiety disorder: Secondary | ICD-10-CM | POA: Diagnosis not present

## 2014-04-27 DIAGNOSIS — J209 Acute bronchitis, unspecified: Secondary | ICD-10-CM | POA: Diagnosis not present

## 2014-04-27 DIAGNOSIS — R202 Paresthesia of skin: Secondary | ICD-10-CM | POA: Diagnosis not present

## 2014-05-23 DIAGNOSIS — Z79899 Other long term (current) drug therapy: Secondary | ICD-10-CM | POA: Diagnosis not present

## 2014-05-23 DIAGNOSIS — G43019 Migraine without aura, intractable, without status migrainosus: Secondary | ICD-10-CM | POA: Diagnosis not present

## 2014-05-23 DIAGNOSIS — R51 Headache: Secondary | ICD-10-CM | POA: Diagnosis not present

## 2014-05-23 DIAGNOSIS — G43719 Chronic migraine without aura, intractable, without status migrainosus: Secondary | ICD-10-CM | POA: Diagnosis not present

## 2014-06-01 DIAGNOSIS — R002 Palpitations: Secondary | ICD-10-CM | POA: Diagnosis not present

## 2014-06-01 DIAGNOSIS — E780 Pure hypercholesterolemia: Secondary | ICD-10-CM | POA: Diagnosis not present

## 2014-06-01 DIAGNOSIS — E039 Hypothyroidism, unspecified: Secondary | ICD-10-CM | POA: Diagnosis not present

## 2014-10-09 DIAGNOSIS — R05 Cough: Secondary | ICD-10-CM | POA: Diagnosis not present

## 2014-12-06 DIAGNOSIS — G43719 Chronic migraine without aura, intractable, without status migrainosus: Secondary | ICD-10-CM | POA: Diagnosis not present

## 2014-12-06 DIAGNOSIS — G43019 Migraine without aura, intractable, without status migrainosus: Secondary | ICD-10-CM | POA: Diagnosis not present

## 2015-01-24 DIAGNOSIS — K219 Gastro-esophageal reflux disease without esophagitis: Secondary | ICD-10-CM | POA: Diagnosis not present

## 2015-01-24 DIAGNOSIS — F411 Generalized anxiety disorder: Secondary | ICD-10-CM | POA: Diagnosis not present

## 2015-01-24 DIAGNOSIS — Z23 Encounter for immunization: Secondary | ICD-10-CM | POA: Diagnosis not present

## 2015-01-24 DIAGNOSIS — E7800 Pure hypercholesterolemia, unspecified: Secondary | ICD-10-CM | POA: Diagnosis not present

## 2015-01-24 DIAGNOSIS — E039 Hypothyroidism, unspecified: Secondary | ICD-10-CM | POA: Diagnosis not present

## 2015-01-24 DIAGNOSIS — R002 Palpitations: Secondary | ICD-10-CM | POA: Diagnosis not present

## 2015-01-24 DIAGNOSIS — L918 Other hypertrophic disorders of the skin: Secondary | ICD-10-CM | POA: Diagnosis not present

## 2015-03-01 DIAGNOSIS — J329 Chronic sinusitis, unspecified: Secondary | ICD-10-CM | POA: Diagnosis not present

## 2015-03-21 DIAGNOSIS — R05 Cough: Secondary | ICD-10-CM | POA: Diagnosis not present

## 2015-03-23 ENCOUNTER — Other Ambulatory Visit: Payer: Self-pay | Admitting: Family Medicine

## 2015-03-23 DIAGNOSIS — R918 Other nonspecific abnormal finding of lung field: Secondary | ICD-10-CM

## 2015-04-02 ENCOUNTER — Ambulatory Visit
Admission: RE | Admit: 2015-04-02 | Discharge: 2015-04-02 | Disposition: A | Discharge status: Home / Self Care | Payer: Medicare Other | Source: Ambulatory Visit | Attending: Family Medicine | Admitting: Family Medicine

## 2015-04-02 DIAGNOSIS — R918 Other nonspecific abnormal finding of lung field: Secondary | ICD-10-CM

## 2015-05-02 DIAGNOSIS — Z1389 Encounter for screening for other disorder: Secondary | ICD-10-CM | POA: Diagnosis not present

## 2015-05-02 DIAGNOSIS — Z01419 Encounter for gynecological examination (general) (routine) without abnormal findings: Secondary | ICD-10-CM | POA: Diagnosis not present

## 2015-05-02 DIAGNOSIS — Z124 Encounter for screening for malignant neoplasm of cervix: Secondary | ICD-10-CM | POA: Diagnosis not present

## 2015-05-02 DIAGNOSIS — Z1231 Encounter for screening mammogram for malignant neoplasm of breast: Secondary | ICD-10-CM | POA: Diagnosis not present

## 2015-05-02 DIAGNOSIS — Z79890 Hormone replacement therapy (postmenopausal): Secondary | ICD-10-CM | POA: Diagnosis not present

## 2015-05-02 DIAGNOSIS — Z6827 Body mass index (BMI) 27.0-27.9, adult: Secondary | ICD-10-CM | POA: Diagnosis not present

## 2015-05-23 DIAGNOSIS — H2513 Age-related nuclear cataract, bilateral: Secondary | ICD-10-CM | POA: Diagnosis not present

## 2015-05-23 DIAGNOSIS — H04123 Dry eye syndrome of bilateral lacrimal glands: Secondary | ICD-10-CM | POA: Diagnosis not present

## 2015-05-23 DIAGNOSIS — H524 Presbyopia: Secondary | ICD-10-CM | POA: Diagnosis not present

## 2015-05-23 DIAGNOSIS — H5203 Hypermetropia, bilateral: Secondary | ICD-10-CM | POA: Diagnosis not present

## 2015-05-23 DIAGNOSIS — H3530 Unspecified macular degeneration: Secondary | ICD-10-CM | POA: Diagnosis not present

## 2015-06-13 DIAGNOSIS — H16143 Punctate keratitis, bilateral: Secondary | ICD-10-CM | POA: Diagnosis not present

## 2015-06-13 DIAGNOSIS — H5203 Hypermetropia, bilateral: Secondary | ICD-10-CM | POA: Diagnosis not present

## 2015-06-20 DIAGNOSIS — R51 Headache: Secondary | ICD-10-CM | POA: Diagnosis not present

## 2015-06-20 DIAGNOSIS — G43719 Chronic migraine without aura, intractable, without status migrainosus: Secondary | ICD-10-CM | POA: Diagnosis not present

## 2015-06-20 DIAGNOSIS — Z79899 Other long term (current) drug therapy: Secondary | ICD-10-CM | POA: Diagnosis not present

## 2015-06-20 DIAGNOSIS — G43019 Migraine without aura, intractable, without status migrainosus: Secondary | ICD-10-CM | POA: Diagnosis not present

## 2015-08-29 DIAGNOSIS — R002 Palpitations: Secondary | ICD-10-CM | POA: Diagnosis not present

## 2015-08-29 DIAGNOSIS — E039 Hypothyroidism, unspecified: Secondary | ICD-10-CM | POA: Diagnosis not present

## 2015-08-29 DIAGNOSIS — E7800 Pure hypercholesterolemia, unspecified: Secondary | ICD-10-CM | POA: Diagnosis not present

## 2015-08-29 DIAGNOSIS — K219 Gastro-esophageal reflux disease without esophagitis: Secondary | ICD-10-CM | POA: Diagnosis not present

## 2015-08-29 DIAGNOSIS — F411 Generalized anxiety disorder: Secondary | ICD-10-CM | POA: Diagnosis not present

## 2015-08-29 DIAGNOSIS — Z209 Contact with and (suspected) exposure to unspecified communicable disease: Secondary | ICD-10-CM | POA: Diagnosis not present

## 2015-11-09 DIAGNOSIS — J209 Acute bronchitis, unspecified: Secondary | ICD-10-CM | POA: Diagnosis not present

## 2015-11-09 DIAGNOSIS — J988 Other specified respiratory disorders: Secondary | ICD-10-CM | POA: Diagnosis not present

## 2015-12-26 DIAGNOSIS — G43719 Chronic migraine without aura, intractable, without status migrainosus: Secondary | ICD-10-CM | POA: Diagnosis not present

## 2015-12-26 DIAGNOSIS — G43019 Migraine without aura, intractable, without status migrainosus: Secondary | ICD-10-CM | POA: Diagnosis not present

## 2016-01-09 DIAGNOSIS — F411 Generalized anxiety disorder: Secondary | ICD-10-CM | POA: Diagnosis not present

## 2016-01-09 DIAGNOSIS — R05 Cough: Secondary | ICD-10-CM | POA: Diagnosis not present

## 2016-01-09 DIAGNOSIS — M79652 Pain in left thigh: Secondary | ICD-10-CM | POA: Diagnosis not present

## 2016-01-09 DIAGNOSIS — Z23 Encounter for immunization: Secondary | ICD-10-CM | POA: Diagnosis not present

## 2016-01-09 DIAGNOSIS — R918 Other nonspecific abnormal finding of lung field: Secondary | ICD-10-CM | POA: Diagnosis not present

## 2016-01-10 ENCOUNTER — Other Ambulatory Visit: Payer: Self-pay | Admitting: Family Medicine

## 2016-01-10 DIAGNOSIS — R918 Other nonspecific abnormal finding of lung field: Secondary | ICD-10-CM

## 2016-01-16 ENCOUNTER — Ambulatory Visit
Admission: RE | Admit: 2016-01-16 | Discharge: 2016-01-16 | Disposition: A | Discharge status: Home / Self Care | Payer: Medicare Other | Source: Ambulatory Visit | Attending: Family Medicine | Admitting: Family Medicine

## 2016-01-16 ENCOUNTER — Other Ambulatory Visit: Payer: Medicare Other

## 2016-01-16 DIAGNOSIS — R918 Other nonspecific abnormal finding of lung field: Secondary | ICD-10-CM

## 2016-01-23 ENCOUNTER — Other Ambulatory Visit: Payer: Medicare Other

## 2016-02-16 DIAGNOSIS — R05 Cough: Secondary | ICD-10-CM | POA: Diagnosis not present

## 2016-02-27 DIAGNOSIS — R002 Palpitations: Secondary | ICD-10-CM | POA: Diagnosis not present

## 2016-02-27 DIAGNOSIS — E663 Overweight: Secondary | ICD-10-CM | POA: Diagnosis not present

## 2016-02-27 DIAGNOSIS — F411 Generalized anxiety disorder: Secondary | ICD-10-CM | POA: Diagnosis not present

## 2016-02-27 DIAGNOSIS — E7800 Pure hypercholesterolemia, unspecified: Secondary | ICD-10-CM | POA: Diagnosis not present

## 2016-02-27 DIAGNOSIS — E039 Hypothyroidism, unspecified: Secondary | ICD-10-CM | POA: Diagnosis not present

## 2016-02-27 DIAGNOSIS — K219 Gastro-esophageal reflux disease without esophagitis: Secondary | ICD-10-CM | POA: Diagnosis not present

## 2016-03-12 DIAGNOSIS — R197 Diarrhea, unspecified: Secondary | ICD-10-CM | POA: Diagnosis not present

## 2016-03-19 DIAGNOSIS — R197 Diarrhea, unspecified: Secondary | ICD-10-CM | POA: Diagnosis not present

## 2016-03-26 DIAGNOSIS — R197 Diarrhea, unspecified: Secondary | ICD-10-CM | POA: Diagnosis not present

## 2016-04-09 DIAGNOSIS — K589 Irritable bowel syndrome without diarrhea: Secondary | ICD-10-CM | POA: Diagnosis not present

## 2016-04-09 DIAGNOSIS — R197 Diarrhea, unspecified: Secondary | ICD-10-CM | POA: Diagnosis not present

## 2016-05-07 DIAGNOSIS — M654 Radial styloid tenosynovitis [de Quervain]: Secondary | ICD-10-CM | POA: Diagnosis not present

## 2016-05-07 DIAGNOSIS — M7582 Other shoulder lesions, left shoulder: Secondary | ICD-10-CM | POA: Diagnosis not present

## 2016-05-14 DIAGNOSIS — Z1231 Encounter for screening mammogram for malignant neoplasm of breast: Secondary | ICD-10-CM | POA: Diagnosis not present

## 2016-05-14 DIAGNOSIS — Z01419 Encounter for gynecological examination (general) (routine) without abnormal findings: Secondary | ICD-10-CM | POA: Diagnosis not present

## 2016-05-14 DIAGNOSIS — Z13820 Encounter for screening for osteoporosis: Secondary | ICD-10-CM | POA: Diagnosis not present

## 2016-05-19 ENCOUNTER — Other Ambulatory Visit: Payer: Self-pay | Admitting: Obstetrics and Gynecology

## 2016-05-19 DIAGNOSIS — R5381 Other malaise: Secondary | ICD-10-CM

## 2016-05-22 ENCOUNTER — Other Ambulatory Visit: Payer: Self-pay | Admitting: Obstetrics and Gynecology

## 2016-05-22 DIAGNOSIS — E2839 Other primary ovarian failure: Secondary | ICD-10-CM

## 2016-05-27 DIAGNOSIS — B349 Viral infection, unspecified: Secondary | ICD-10-CM | POA: Diagnosis not present

## 2016-05-27 DIAGNOSIS — J029 Acute pharyngitis, unspecified: Secondary | ICD-10-CM | POA: Diagnosis not present

## 2016-05-27 DIAGNOSIS — J020 Streptococcal pharyngitis: Secondary | ICD-10-CM | POA: Diagnosis not present

## 2016-05-28 ENCOUNTER — Other Ambulatory Visit: Payer: Medicare Other

## 2016-06-04 ENCOUNTER — Other Ambulatory Visit: Payer: Medicare Other

## 2016-06-11 ENCOUNTER — Ambulatory Visit
Admission: RE | Admit: 2016-06-11 | Discharge: 2016-06-11 | Disposition: A | Discharge status: Home / Self Care | Payer: Medicare Other | Source: Ambulatory Visit | Attending: Obstetrics and Gynecology | Admitting: Obstetrics and Gynecology

## 2016-06-11 DIAGNOSIS — M85851 Other specified disorders of bone density and structure, right thigh: Secondary | ICD-10-CM | POA: Diagnosis not present

## 2016-06-11 DIAGNOSIS — Z780 Asymptomatic menopausal state: Secondary | ICD-10-CM | POA: Diagnosis not present

## 2016-06-11 DIAGNOSIS — R197 Diarrhea, unspecified: Secondary | ICD-10-CM | POA: Diagnosis not present

## 2016-06-11 DIAGNOSIS — E2839 Other primary ovarian failure: Secondary | ICD-10-CM

## 2016-07-09 DIAGNOSIS — G43019 Migraine without aura, intractable, without status migrainosus: Secondary | ICD-10-CM | POA: Diagnosis not present

## 2016-07-09 DIAGNOSIS — G43719 Chronic migraine without aura, intractable, without status migrainosus: Secondary | ICD-10-CM | POA: Diagnosis not present

## 2016-08-27 DIAGNOSIS — M654 Radial styloid tenosynovitis [de Quervain]: Secondary | ICD-10-CM | POA: Diagnosis not present

## 2016-08-27 DIAGNOSIS — E663 Overweight: Secondary | ICD-10-CM | POA: Diagnosis not present

## 2016-08-27 DIAGNOSIS — E7800 Pure hypercholesterolemia, unspecified: Secondary | ICD-10-CM | POA: Diagnosis not present

## 2016-08-27 DIAGNOSIS — K219 Gastro-esophageal reflux disease without esophagitis: Secondary | ICD-10-CM | POA: Diagnosis not present

## 2016-08-27 DIAGNOSIS — Z6826 Body mass index (BMI) 26.0-26.9, adult: Secondary | ICD-10-CM | POA: Diagnosis not present

## 2016-08-27 DIAGNOSIS — E039 Hypothyroidism, unspecified: Secondary | ICD-10-CM | POA: Diagnosis not present

## 2016-08-27 DIAGNOSIS — F411 Generalized anxiety disorder: Secondary | ICD-10-CM | POA: Diagnosis not present

## 2016-08-27 DIAGNOSIS — R002 Palpitations: Secondary | ICD-10-CM | POA: Diagnosis not present

## 2016-12-31 DIAGNOSIS — G43719 Chronic migraine without aura, intractable, without status migrainosus: Secondary | ICD-10-CM | POA: Diagnosis not present

## 2016-12-31 DIAGNOSIS — G43019 Migraine without aura, intractable, without status migrainosus: Secondary | ICD-10-CM | POA: Diagnosis not present

## 2017-02-12 DIAGNOSIS — R05 Cough: Secondary | ICD-10-CM | POA: Diagnosis not present

## 2017-02-12 DIAGNOSIS — J069 Acute upper respiratory infection, unspecified: Secondary | ICD-10-CM | POA: Diagnosis not present

## 2017-02-18 DIAGNOSIS — R05 Cough: Secondary | ICD-10-CM | POA: Diagnosis not present

## 2017-02-18 DIAGNOSIS — J209 Acute bronchitis, unspecified: Secondary | ICD-10-CM | POA: Diagnosis not present

## 2017-03-11 ENCOUNTER — Other Ambulatory Visit: Payer: Self-pay | Admitting: Family Medicine

## 2017-03-11 DIAGNOSIS — K219 Gastro-esophageal reflux disease without esophagitis: Secondary | ICD-10-CM | POA: Diagnosis not present

## 2017-03-11 DIAGNOSIS — F411 Generalized anxiety disorder: Secondary | ICD-10-CM | POA: Diagnosis not present

## 2017-03-11 DIAGNOSIS — R918 Other nonspecific abnormal finding of lung field: Secondary | ICD-10-CM

## 2017-03-11 DIAGNOSIS — Z86010 Personal history of colonic polyps: Secondary | ICD-10-CM | POA: Diagnosis not present

## 2017-03-11 DIAGNOSIS — E7800 Pure hypercholesterolemia, unspecified: Secondary | ICD-10-CM | POA: Diagnosis not present

## 2017-03-11 DIAGNOSIS — G43009 Migraine without aura, not intractable, without status migrainosus: Secondary | ICD-10-CM | POA: Diagnosis not present

## 2017-03-11 DIAGNOSIS — R002 Palpitations: Secondary | ICD-10-CM | POA: Diagnosis not present

## 2017-03-11 DIAGNOSIS — E663 Overweight: Secondary | ICD-10-CM | POA: Diagnosis not present

## 2017-03-11 DIAGNOSIS — Z23 Encounter for immunization: Secondary | ICD-10-CM | POA: Diagnosis not present

## 2017-03-18 ENCOUNTER — Ambulatory Visit
Admission: RE | Admit: 2017-03-18 | Discharge: 2017-03-18 | Disposition: A | Discharge status: Home / Self Care | Payer: Medicare Other | Source: Ambulatory Visit | Attending: Family Medicine | Admitting: Family Medicine

## 2017-03-18 DIAGNOSIS — R918 Other nonspecific abnormal finding of lung field: Secondary | ICD-10-CM | POA: Diagnosis not present

## 2017-03-28 DIAGNOSIS — H524 Presbyopia: Secondary | ICD-10-CM | POA: Diagnosis not present

## 2017-03-28 DIAGNOSIS — H353131 Nonexudative age-related macular degeneration, bilateral, early dry stage: Secondary | ICD-10-CM | POA: Diagnosis not present

## 2017-03-28 DIAGNOSIS — H5203 Hypermetropia, bilateral: Secondary | ICD-10-CM | POA: Diagnosis not present

## 2017-03-28 DIAGNOSIS — H40013 Open angle with borderline findings, low risk, bilateral: Secondary | ICD-10-CM | POA: Diagnosis not present

## 2017-03-28 DIAGNOSIS — H2513 Age-related nuclear cataract, bilateral: Secondary | ICD-10-CM | POA: Diagnosis not present

## 2017-04-01 DIAGNOSIS — H40013 Open angle with borderline findings, low risk, bilateral: Secondary | ICD-10-CM | POA: Diagnosis not present

## 2017-04-13 DIAGNOSIS — G5702 Lesion of sciatic nerve, left lower limb: Secondary | ICD-10-CM | POA: Diagnosis not present

## 2017-04-13 DIAGNOSIS — R6889 Other general symptoms and signs: Secondary | ICD-10-CM | POA: Diagnosis not present

## 2017-06-10 DIAGNOSIS — Z6826 Body mass index (BMI) 26.0-26.9, adult: Secondary | ICD-10-CM | POA: Diagnosis not present

## 2017-06-10 DIAGNOSIS — Z124 Encounter for screening for malignant neoplasm of cervix: Secondary | ICD-10-CM | POA: Diagnosis not present

## 2017-06-10 DIAGNOSIS — Z1231 Encounter for screening mammogram for malignant neoplasm of breast: Secondary | ICD-10-CM | POA: Diagnosis not present

## 2017-06-10 DIAGNOSIS — Z01419 Encounter for gynecological examination (general) (routine) without abnormal findings: Secondary | ICD-10-CM | POA: Diagnosis not present

## 2017-06-11 DIAGNOSIS — Z124 Encounter for screening for malignant neoplasm of cervix: Secondary | ICD-10-CM | POA: Diagnosis not present

## 2017-07-22 DIAGNOSIS — G43719 Chronic migraine without aura, intractable, without status migrainosus: Secondary | ICD-10-CM | POA: Diagnosis not present

## 2017-07-22 DIAGNOSIS — G43019 Migraine without aura, intractable, without status migrainosus: Secondary | ICD-10-CM | POA: Diagnosis not present

## 2017-09-16 DIAGNOSIS — E7800 Pure hypercholesterolemia, unspecified: Secondary | ICD-10-CM | POA: Diagnosis not present

## 2017-09-16 DIAGNOSIS — J309 Allergic rhinitis, unspecified: Secondary | ICD-10-CM | POA: Diagnosis not present

## 2017-09-16 DIAGNOSIS — F411 Generalized anxiety disorder: Secondary | ICD-10-CM | POA: Diagnosis not present

## 2017-09-16 DIAGNOSIS — I700 Atherosclerosis of aorta: Secondary | ICD-10-CM | POA: Diagnosis not present

## 2017-09-16 DIAGNOSIS — E039 Hypothyroidism, unspecified: Secondary | ICD-10-CM | POA: Diagnosis not present

## 2017-09-16 DIAGNOSIS — R002 Palpitations: Secondary | ICD-10-CM | POA: Diagnosis not present

## 2017-09-16 DIAGNOSIS — K219 Gastro-esophageal reflux disease without esophagitis: Secondary | ICD-10-CM | POA: Diagnosis not present

## 2017-09-16 DIAGNOSIS — Z6826 Body mass index (BMI) 26.0-26.9, adult: Secondary | ICD-10-CM | POA: Diagnosis not present

## 2017-10-14 DIAGNOSIS — R198 Other specified symptoms and signs involving the digestive system and abdomen: Secondary | ICD-10-CM | POA: Diagnosis not present

## 2017-10-14 DIAGNOSIS — K589 Irritable bowel syndrome without diarrhea: Secondary | ICD-10-CM | POA: Diagnosis not present

## 2017-10-14 DIAGNOSIS — Z86010 Personal history of colonic polyps: Secondary | ICD-10-CM | POA: Diagnosis not present

## 2017-10-14 DIAGNOSIS — R1033 Periumbilical pain: Secondary | ICD-10-CM | POA: Diagnosis not present

## 2017-11-25 DIAGNOSIS — Z86010 Personal history of colonic polyps: Secondary | ICD-10-CM | POA: Diagnosis not present

## 2017-11-25 DIAGNOSIS — K589 Irritable bowel syndrome without diarrhea: Secondary | ICD-10-CM | POA: Diagnosis not present

## 2018-02-03 ENCOUNTER — Ambulatory Visit (INDEPENDENT_AMBULATORY_CARE_PROVIDER_SITE_OTHER): Payer: Medicare Other

## 2018-02-03 ENCOUNTER — Encounter (INDEPENDENT_AMBULATORY_CARE_PROVIDER_SITE_OTHER): Payer: Self-pay | Admitting: Orthopaedic Surgery

## 2018-02-03 ENCOUNTER — Ambulatory Visit (INDEPENDENT_AMBULATORY_CARE_PROVIDER_SITE_OTHER): Payer: Medicare Other | Admitting: Orthopaedic Surgery

## 2018-02-03 DIAGNOSIS — M1812 Unilateral primary osteoarthritis of first carpometacarpal joint, left hand: Secondary | ICD-10-CM | POA: Diagnosis not present

## 2018-02-03 MED ORDER — LIDOCAINE HCL 1 % IJ SOLN
0.3000 mL | INTRAMUSCULAR | Status: AC | PRN
  Administered 2018-02-03: .3 mL

## 2018-02-03 MED ORDER — METHYLPREDNISOLONE ACETATE 40 MG/ML IJ SUSP
13.3300 mg | INTRAMUSCULAR | Status: AC | PRN
  Administered 2018-02-03: 13.33 mg

## 2018-02-03 MED ORDER — BUPIVACAINE HCL 0.5 % IJ SOLN
0.3300 mL | INTRAMUSCULAR | Status: AC | PRN
  Administered 2018-02-03: .33 mL

## 2018-02-03 NOTE — Progress Notes (Signed)
Office Visit Note   Patient: Toni Rich           Date of Birth: 1945/05/28           MRN: [id number] Visit Date: 02/03/2018              Requested by: Sigmund Hazel, MD [address] [place], [place] [address] PCP: Sigmund Hazel, MD   Assessment & Plan: Visit Diagnoses:  1. Primary osteoarthritis of first carpometacarpal joint of left hand     Plan: Impression is left thumb CMC arthritis.  Today we discussed a variety of treatment options which included cortisone injection as well as thumb abduction orthotic.  I gave her a sample of pen said to try.  Rest and activity modification as needed.  Questions encouraged and answered.  Follow-up as needed.  Follow-Up Instructions: Return if symptoms worsen or fail to improve.   Orders:  Orders Placed This Encounter  Procedures  . XR Wrist Complete Left   No orders of the defined types were placed in this encounter.     Procedures: Hand/UE Inj: L thumb CMC for osteoarthritis on 02/03/2018 2:06 PM Indications: pain Details: 25 G needle Medications: 0.3 mL lidocaine 1 %; 0.33 mL bupivacaine 0.5 %; 13.33 mg methylPREDNISolone acetate 40 MG/ML Outcome: tolerated well, no immediate complications Patient was prepped and draped in the usual sterile fashion.       Clinical Data: No additional findings.   Subjective: Chief Complaint  Patient presents with  . 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  The pain is off and on and worse with use of her left hand and taking care of children.  She denies any injuries.  She denies any numbness and tingling.  She uses Biofreeze for the pain which does help.  She is right-hand dominant.  She endorses some radiation of pain up into the forearm.   Review of Systems  Constitutional: Negative.   HENT: Negative.   Eyes: Negative.   Respiratory: Negative.   Cardiovascular: Negative.   Endocrine: Negative.     Musculoskeletal: Negative.   Neurological: Negative.   Hematological: Negative.   Psychiatric/Behavioral: Negative.   All other systems reviewed and are negative.    Objective: Vital Signs: There were no vitals taken for this visit.  Physical Exam  Constitutional: She is oriented to person, place, and time. She appears well-developed and well-nourished.  HENT:  Head: Normocephalic and atraumatic.  Eyes: EOM are normal.  Neck: Neck supple.  Pulmonary/Chest: Effort normal.  Abdominal: Soft.  Neurological: She is alert and oriented to person, place, and time.  Skin: Skin is warm. Capillary refill takes less than 2 seconds.  Psychiatric: She has a normal mood and affect. Her behavior is normal. Judgment and thought content normal.  Nursing note and vitals reviewed.   Ortho Exam Left hand exam shows a positive grind test.  Negative Finkelstein's.  No triggering of the thumb.  MCP joint is stable. Specialty Comments:  No specialty comments available.  Imaging: Xr Wrist Complete Left  Result Date: 02/03/2018 Moderate thumb CMC arthritis    PMFS History: Patient Active Problem List   Diagnosis Date Noted  . Hypokalemia 12/17/2011  . Bronchitis 12/17/2011  . Bilateral atelectasis 12/15/2011  . Colitis 12/13/2011  . Leukocytosis 12/13/2011   Past Medical History:  Diagnosis Date  . Anxiety   . Colonic polyp   .  Depression   . Hypercholesteremia   . Hypothyroid   . IBS (irritable bowel syndrome)   . Migraines   . Palpitations   . Peripheral neuropathy     Family History  Family history unknown: Yes    Past Surgical History:  Procedure Laterality Date  . CESAREAN SECTION    . TUBAL LIGATION     Social History   Occupational History  . Not on file  Tobacco Use  . Smoking status: Former Smoker  Substance and Sexual Activity  . Alcohol use: No  . Drug use: No  . Sexual activity: Not on file

## 2018-02-10 DIAGNOSIS — Z23 Encounter for immunization: Secondary | ICD-10-CM | POA: Diagnosis not present

## 2018-02-10 DIAGNOSIS — G43019 Migraine without aura, intractable, without status migrainosus: Secondary | ICD-10-CM | POA: Diagnosis not present

## 2018-02-10 DIAGNOSIS — G43719 Chronic migraine without aura, intractable, without status migrainosus: Secondary | ICD-10-CM | POA: Diagnosis not present

## 2018-03-31 DIAGNOSIS — K589 Irritable bowel syndrome without diarrhea: Secondary | ICD-10-CM | POA: Diagnosis not present

## 2018-03-31 DIAGNOSIS — E7800 Pure hypercholesterolemia, unspecified: Secondary | ICD-10-CM | POA: Diagnosis not present

## 2018-03-31 DIAGNOSIS — E039 Hypothyroidism, unspecified: Secondary | ICD-10-CM | POA: Diagnosis not present

## 2018-03-31 DIAGNOSIS — F411 Generalized anxiety disorder: Secondary | ICD-10-CM | POA: Diagnosis not present

## 2018-03-31 DIAGNOSIS — K219 Gastro-esophageal reflux disease without esophagitis: Secondary | ICD-10-CM | POA: Diagnosis not present

## 2018-03-31 DIAGNOSIS — Z6825 Body mass index (BMI) 25.0-25.9, adult: Secondary | ICD-10-CM | POA: Diagnosis not present

## 2018-03-31 DIAGNOSIS — R002 Palpitations: Secondary | ICD-10-CM | POA: Diagnosis not present

## 2018-04-29 ENCOUNTER — Other Ambulatory Visit: Payer: Self-pay | Admitting: Family Medicine

## 2018-04-29 DIAGNOSIS — K219 Gastro-esophageal reflux disease without esophagitis: Secondary | ICD-10-CM

## 2018-05-05 ENCOUNTER — Ambulatory Visit
Admission: RE | Admit: 2018-05-05 | Discharge: 2018-05-05 | Disposition: A | Discharge status: Home / Self Care | Payer: Medicare Other | Source: Ambulatory Visit | Attending: Family Medicine | Admitting: Family Medicine

## 2018-05-05 DIAGNOSIS — K219 Gastro-esophageal reflux disease without esophagitis: Secondary | ICD-10-CM

## 2018-05-07 ENCOUNTER — Other Ambulatory Visit: Payer: Self-pay | Admitting: Family Medicine

## 2018-05-07 ENCOUNTER — Other Ambulatory Visit (HOSPITAL_COMMUNITY): Payer: Self-pay | Admitting: Family Medicine

## 2018-05-07 DIAGNOSIS — K219 Gastro-esophageal reflux disease without esophagitis: Secondary | ICD-10-CM

## 2018-05-26 ENCOUNTER — Encounter (HOSPITAL_COMMUNITY)
Admission: RE | Admit: 2018-05-26 | Discharge: 2018-05-26 | Disposition: A | Discharge status: Home / Self Care | Payer: Medicare Other | Source: Ambulatory Visit | Attending: Family Medicine | Admitting: Family Medicine

## 2018-05-26 DIAGNOSIS — K219 Gastro-esophageal reflux disease without esophagitis: Secondary | ICD-10-CM

## 2018-05-26 MED ORDER — TECHNETIUM TC 99M MEBROFENIN IV KIT
5.0000 | PACK | Freq: Once | INTRAVENOUS | Status: AC | PRN
  Administered 2018-05-26: 5 via INTRAVENOUS

## 2018-08-03 ENCOUNTER — Telehealth (INDEPENDENT_AMBULATORY_CARE_PROVIDER_SITE_OTHER): Payer: Self-pay

## 2018-08-03 NOTE — Telephone Encounter (Signed)
Patient Toni Rich on VM regarding hand splint.  I called her back and she is wanting to know what brace she was given at her OV.  I advised per dictation that it was a thumb abduction orthotic.

## 2018-10-28 ENCOUNTER — Other Ambulatory Visit: Payer: Self-pay

## 2018-10-28 ENCOUNTER — Ambulatory Visit (INDEPENDENT_AMBULATORY_CARE_PROVIDER_SITE_OTHER): Payer: Medicare Other | Admitting: Orthopaedic Surgery

## 2018-10-28 ENCOUNTER — Encounter: Payer: Self-pay | Admitting: Orthopaedic Surgery

## 2018-10-28 ENCOUNTER — Ambulatory Visit: Payer: Self-pay

## 2018-10-28 DIAGNOSIS — M1812 Unilateral primary osteoarthritis of first carpometacarpal joint, left hand: Secondary | ICD-10-CM | POA: Diagnosis not present

## 2018-10-28 DIAGNOSIS — M25561 Pain in right knee: Secondary | ICD-10-CM

## 2018-10-28 MED ORDER — LIDOCAINE HCL 1 % IJ SOLN
0.3000 mL | INTRAMUSCULAR | Status: AC | PRN
  Administered 2018-10-28: .3 mL

## 2018-10-28 MED ORDER — BUPIVACAINE HCL 0.5 % IJ SOLN
0.3300 mL | INTRAMUSCULAR | Status: AC | PRN
  Administered 2018-10-28: .33 mL

## 2018-10-28 MED ORDER — METHYLPREDNISOLONE ACETATE 40 MG/ML IJ SUSP
13.3300 mg | INTRAMUSCULAR | Status: AC | PRN
  Administered 2018-10-28: 13.33 mg

## 2018-10-28 NOTE — Progress Notes (Signed)
Office Visit Note   Patient: Toni Rich           Date of Birth: [birth year]           MRN: [id number] Visit Date: 10/28/2018              Requested by: Kathyrn Lass, [place],  [place] [address] PCP: Kathyrn Lass, MD   Assessment & Plan: Visit Diagnoses:  1. Right knee pain, unspecified chronicity   2. Primary osteoarthritis of first carpometacarpal joint of left hand     Plan: Impression is continued left thumb CMC arthritis.  We repeated the injection today.  Patient will continue to use the abduction orthotic as well as Voltaren gel and Biofreeze.  Fortunately she does not have chronic and constant pain and does not have nighttime pain.  She is not interested in surgery currently.  For the right knee she will use Voltaren gel and Biofreeze.  Declined cortisone injection today.  Follow-up as needed.  Follow-Up Instructions: Return if symptoms worsen or fail to improve.   Orders:  Orders Placed This Encounter  Procedures  . XR Knee Complete 4 Views Right   No orders of the defined types were placed in this encounter.     Procedures: Hand/UE Inj: L thumb CMC for osteoarthritis on 10/28/2018 9:17 AM Indications: pain Details: 25 G needle Medications: 0.3 mL lidocaine 1 %; 0.33 mL bupivacaine 0.5 %; 13.33 mg methylPREDNISolone acetate 40 MG/ML Outcome: tolerated well, no immediate complications Patient was prepped and draped in the usual sterile fashion.       Clinical Data: No additional findings.   Subjective: Chief Complaint  Patient presents with  . Right Knee - Pain  . Left Hand - Pain    Toni Rich is a 73 year old female who follows up today for her left thumb CMC arthritis as well as right knee pain.  She owns a Research scientist (life sciences).  She denies any numbness and tingling in her right knee.  She endorses pain with some occasional catching but no locking or swelling.  She uses Biofreeze and Voltaren gel for her left hand.  The previous [place]  injection did give her significant relief.   Review of Systems  Constitutional: Negative.   HENT: Negative.   Eyes: Negative.   Respiratory: Negative.   Cardiovascular: Negative.   Endocrine: Negative.   Musculoskeletal: Negative.   Neurological: Negative.   Hematological: Negative.   Psychiatric/Behavioral: Negative.   All other systems reviewed and are negative.    Objective: Vital Signs: There were no vitals taken for this visit.  Physical Exam Vitals signs and nursing note reviewed.  Constitutional:      Appearance: She is well-developed.  Pulmonary:     Effort: Pulmonary effort is normal.  Skin:    General: Skin is warm.     Capillary Refill: Capillary refill takes less than 2 seconds.  Neurological:     Mental Status: She is alert and oriented to person, place, and time.  Psychiatric:        Behavior: Behavior normal.        Thought Content: Thought content normal.        Judgment: Judgment normal.     Ortho Exam +grind test Right knee, no joint effusion.  Collaterals and cruciates are stable.  Very mild medial joint line tenderness.  Negative [place]. Specialty Comments:  No specialty comments available.  Imaging: Xr Knee Complete 4 Views Right  Result Date: 10/28/2018 Mild  osteoarthritis with lateral joint space narrowing on flexion view.    PMFS History: Patient Active Problem List   Diagnosis Date Noted  . Right knee pain 10/28/2018  . Primary osteoarthritis of first carpometacarpal joint of left hand 10/28/2018  . Hypokalemia 12/17/2011  . Bronchitis 12/17/2011  . Bilateral atelectasis 12/15/2011  . Colitis 12/13/2011  . Leukocytosis 12/13/2011   Past Medical History:  Diagnosis Date  . Anxiety   . Colonic polyp   . Depression   . Hypercholesteremia   . Hypothyroid   . IBS (irritable bowel syndrome)   . Migraines   . Palpitations   . Peripheral neuropathy     Family History  Family history unknown: Yes    Past Surgical History:   Procedure Laterality Date  . CESAREAN SECTION    . TUBAL LIGATION     Social History   Occupational History  . Not on file  Tobacco Use  . Smoking status: Former Smoker  Substance and Sexual Activity  . Alcohol use: No  . Drug use: No  . Sexual activity: Not on file

## 2018-10-29 ENCOUNTER — Other Ambulatory Visit: Payer: Self-pay | Admitting: Family Medicine

## 2018-10-29 DIAGNOSIS — R221 Localized swelling, mass and lump, neck: Secondary | ICD-10-CM

## 2018-11-02 ENCOUNTER — Ambulatory Visit: Payer: Medicare Other | Admitting: Orthopaedic Surgery

## 2018-11-08 ENCOUNTER — Other Ambulatory Visit: Payer: Medicare Other

## 2018-11-10 ENCOUNTER — Ambulatory Visit
Admission: RE | Admit: 2018-11-10 | Discharge: 2018-11-10 | Disposition: A | Discharge status: Home / Self Care | Payer: Medicare Other | Source: Ambulatory Visit | Attending: Family Medicine | Admitting: Family Medicine

## 2018-11-10 ENCOUNTER — Other Ambulatory Visit: Payer: Self-pay

## 2018-11-10 DIAGNOSIS — R221 Localized swelling, mass and lump, neck: Secondary | ICD-10-CM

## 2018-11-30 ENCOUNTER — Other Ambulatory Visit: Payer: Self-pay

## 2018-11-30 DIAGNOSIS — Z20822 Contact with and (suspected) exposure to covid-19: Secondary | ICD-10-CM

## 2018-12-01 LAB — NOVEL CORONAVIRUS, NAA: SARS-CoV-2, NAA: NOT DETECTED

## 2018-12-21 ENCOUNTER — Other Ambulatory Visit: Payer: Self-pay

## 2018-12-21 DIAGNOSIS — Z20822 Contact with and (suspected) exposure to covid-19: Secondary | ICD-10-CM

## 2018-12-23 LAB — NOVEL CORONAVIRUS, NAA: SARS-CoV-2, NAA: NOT DETECTED

## 2019-03-25 ENCOUNTER — Encounter: Payer: Self-pay | Admitting: Orthopaedic Surgery

## 2019-03-25 ENCOUNTER — Ambulatory Visit: Payer: Medicare Other | Admitting: Orthopaedic Surgery

## 2019-03-25 ENCOUNTER — Other Ambulatory Visit: Payer: Self-pay

## 2019-03-25 VITALS — Ht 62.0 in | Wt 140.0 lb

## 2019-03-25 DIAGNOSIS — M79651 Pain in right thigh: Secondary | ICD-10-CM | POA: Diagnosis not present

## 2019-03-25 DIAGNOSIS — M1812 Unilateral primary osteoarthritis of first carpometacarpal joint, left hand: Secondary | ICD-10-CM | POA: Diagnosis not present

## 2019-03-25 MED ORDER — METHYLPREDNISOLONE ACETATE 40 MG/ML IJ SUSP
13.3300 mg | INTRAMUSCULAR | Status: AC | PRN
  Administered 2019-03-25: 09:00:00 13.33 mg

## 2019-03-25 MED ORDER — LIDOCAINE HCL 1 % IJ SOLN
0.3000 mL | INTRAMUSCULAR | Status: AC | PRN
  Administered 2019-03-25: .3 mL

## 2019-03-25 MED ORDER — BUPIVACAINE HCL 0.5 % IJ SOLN
0.3300 mL | INTRAMUSCULAR | Status: AC | PRN
  Administered 2019-03-25: .33 mL

## 2019-03-25 NOTE — Progress Notes (Signed)
Office Visit Note   Patient: Toni Rich           Date of Birth: 01/15/1946           MRN: [id number] Visit Date: 03/25/2019              Requested by: Kathyrn Lass, [place],  [place] [address] PCP: Kathyrn Lass, MD   Assessment & Plan: Visit Diagnoses:  1. Primary osteoarthritis of first carpometacarpal joint of left hand   2. Right thigh pain     Plan: We repeated the thumb CMC injection today.  Patient tolerated this well.  In terms of the thigh symptoms I suspect possibly meralgia paresthetica.  She will try not wearing the belt for couple weeks to see if this will improve things.  Overall the symptoms are not severe enough to warrant medications.  Questions encouraged and answered.  Follow-up as needed.  Follow-Up Instructions: Return if symptoms worsen or fail to improve.   Orders:  No orders of the defined types were placed in this encounter.  No orders of the defined types were placed in this encounter.     Procedures: Hand/UE Inj: L thumb CMC for osteoarthritis on 03/25/2019 8:39 AM Indications: pain Details: 25 G needle Medications: 0.3 mL lidocaine 1 %; 0.33 mL bupivacaine 0.5 %; 13.33 mg methylPREDNISolone acetate 40 MG/ML Outcome: tolerated well, no immediate complications      Clinical Data: No additional findings.   Subjective: Chief Complaint  Patient presents with  . Right Leg - Pain  . Left Hand - Pain    Toni Rich is a 73 year old female who comes in for recurrent left CMC arthrosis.  She is open to get another injection today.  Her last one was about 5 months ago with good relief.  She is also complaining of some right thigh tingling and crawling sensation.  Denies any back pain or groin pain.  She states that she thinks it may be related to wearing a belt which causes compression across her waistline.  Denies any injuries.   Review of Systems  Constitutional: Negative.   HENT: Negative.   Eyes: Negative.     Respiratory: Negative.   Cardiovascular: Negative.   Endocrine: Negative.   Musculoskeletal: Negative.   Neurological: Negative.   Hematological: Negative.   Psychiatric/Behavioral: Negative.   All other systems reviewed and are negative.    Objective: Vital Signs: Ht 5\' 2"  (1.575 m)   Wt 140 lb (63.5 kg)   BMI 25.61 kg/m   Physical Exam Vitals and nursing note reviewed.  Constitutional:      Appearance: She is well-developed.  HENT:     Head: Normocephalic and atraumatic.  Pulmonary:     Effort: Pulmonary effort is normal.  Abdominal:     Palpations: Abdomen is soft.  Musculoskeletal:     Cervical back: Neck supple.  Skin:    General: Skin is warm.     Capillary Refill: Capillary refill takes less than 2 seconds.  Neurological:     Mental Status: She is alert and oriented to person, place, and time.  Psychiatric:        Behavior: Behavior normal.        Thought Content: Thought content normal.        Judgment: Judgment normal.     Ortho Exam Left hand exam is unchanged.  Positive grind test.  Right hip and right leg exam shows painless full range of motion of the hip  and knee and ankle without pain.  Negative sciatic tension signs.  No tenderness palpation Specialty Comments:  No specialty comments available.  Imaging: No results found.   PMFS History: Patient Active Problem List   Diagnosis Date Noted  . Right thigh pain 03/25/2019  . Right knee pain 10/28/2018  . Primary osteoarthritis of first carpometacarpal joint of left hand 10/28/2018  . Hypokalemia 12/17/2011  . Bronchitis 12/17/2011  . Bilateral atelectasis 12/15/2011  . Colitis 12/13/2011  . Leukocytosis 12/13/2011   Past Medical History:  Diagnosis Date  . Anxiety   . Colonic polyp   . Depression   . Hypercholesteremia   . Hypothyroid   . IBS (irritable bowel syndrome)   . Migraines   . Palpitations   . Peripheral neuropathy     Family History  Family history unknown: Yes     Past Surgical History:  Procedure Laterality Date  . CESAREAN SECTION    . TUBAL LIGATION     Social History   Occupational History  . Not on file  Tobacco Use  . Smoking status: Former Smoker  Substance and Sexual Activity  . Alcohol use: No  . Drug use: No  . Sexual activity: Not on file

## 2019-07-02 IMAGING — CT CT CHEST W/O CM
1 series · 15 of 34 positions shown, 19 images · non-contrast
Comparison: 01/16/2016, 04/02/2015

CLINICAL DATA: Followup pulmonary nodules

EXAM:
CT CHEST WITHOUT CONTRAST
TECHNIQUE: Multidetector CT imaging of the chest was performed following the
standard protocol without IV contrast. Sagittal and coronal MPR
images reconstructed from axial data set.

[Series 2: chest w/(date) · axial · 0.61mm/px · z∈[+382,+616]mm · 15 of 139 slices shown, 19 images]
[im 11/139  mediastinal]
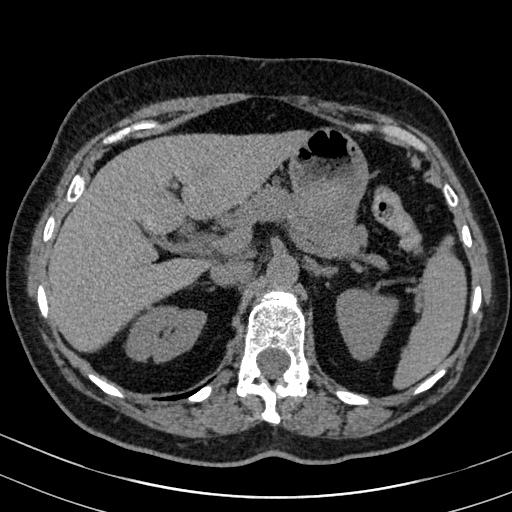
[im 11/139  lung]
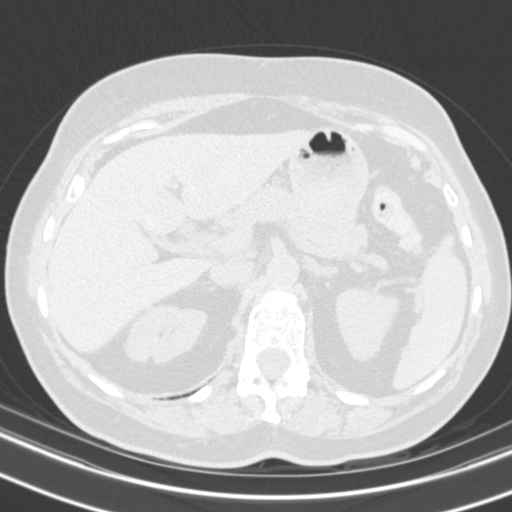
[im 21/139  lung]
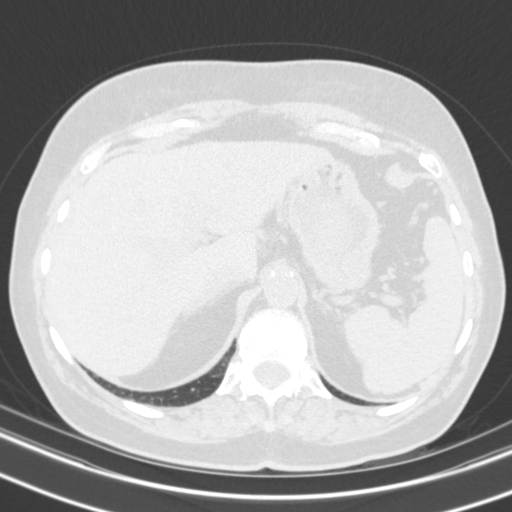
[im 28/139  lung]
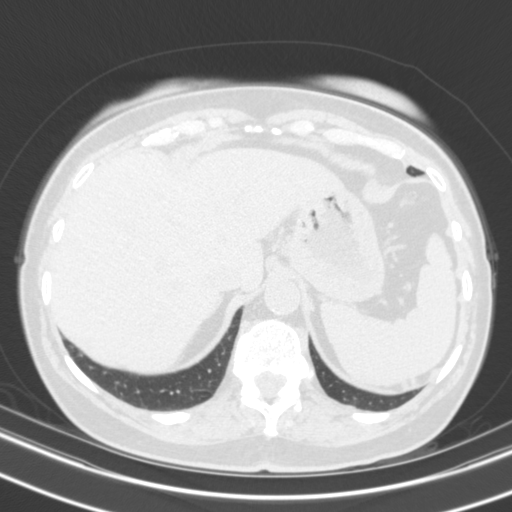
[im 36/139  lung]
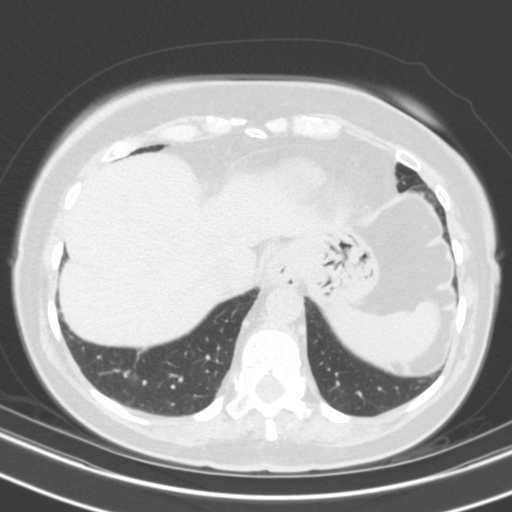
[im 47/139  mediastinal]
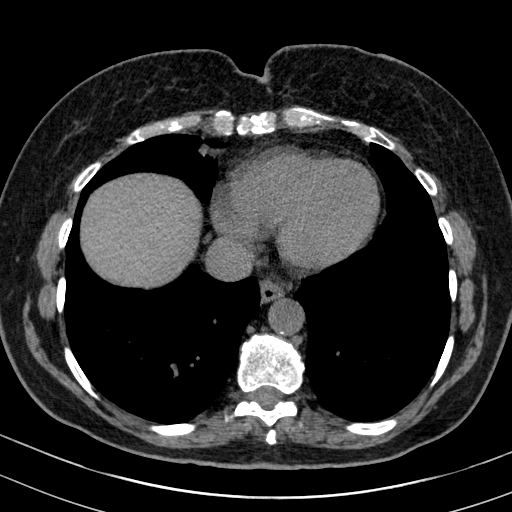
[im 47/139  lung]
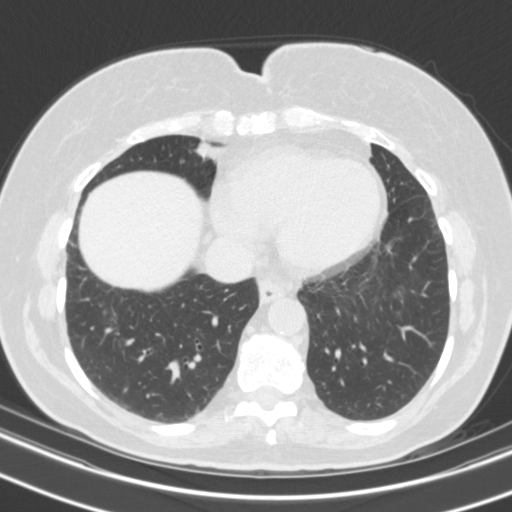
[im 56/139  lung]
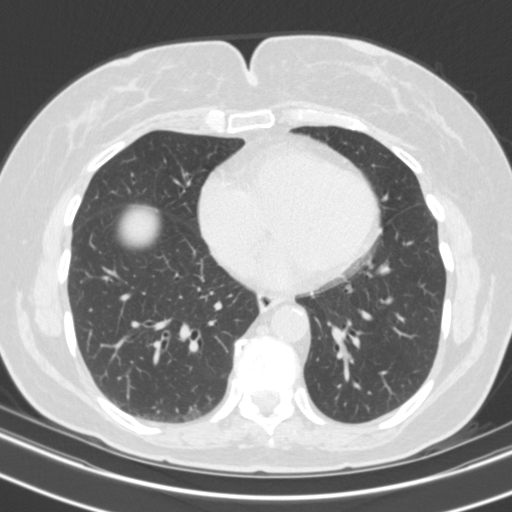
[im 62/139  lung]
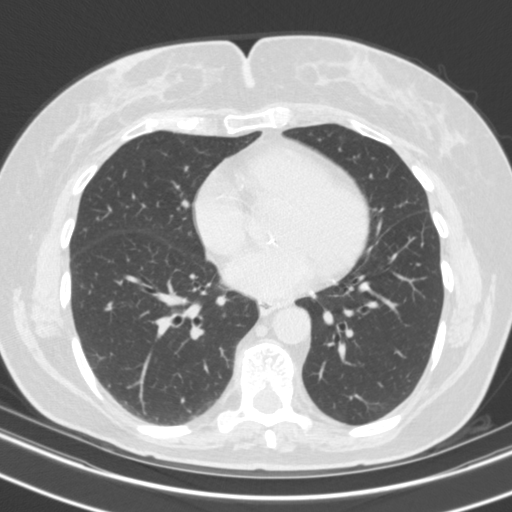
[im 72/139  lung]
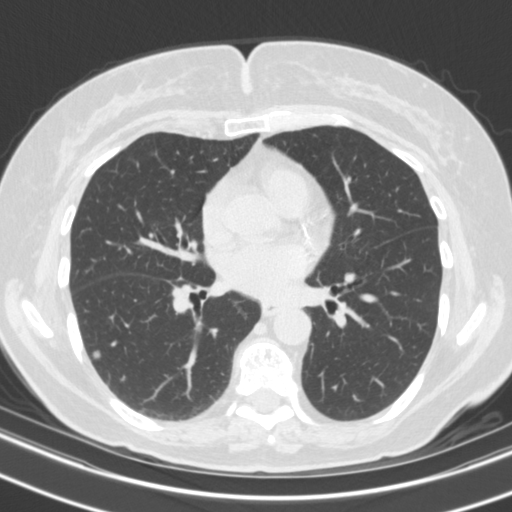
[im 77/139  mediastinal]
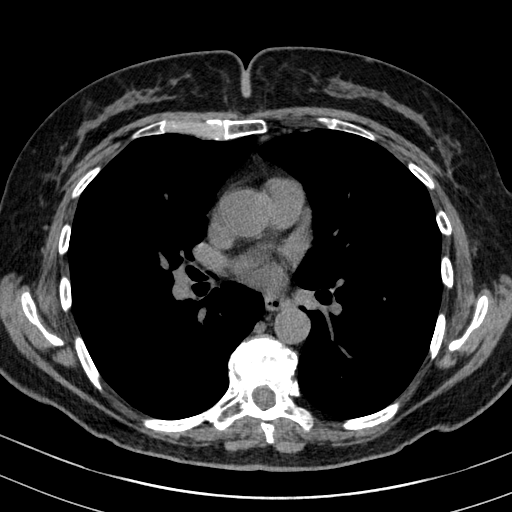
[im 77/139  lung]
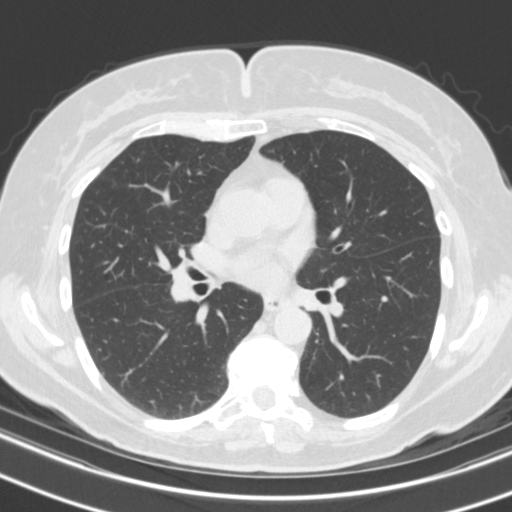
[im 83/139  lung]
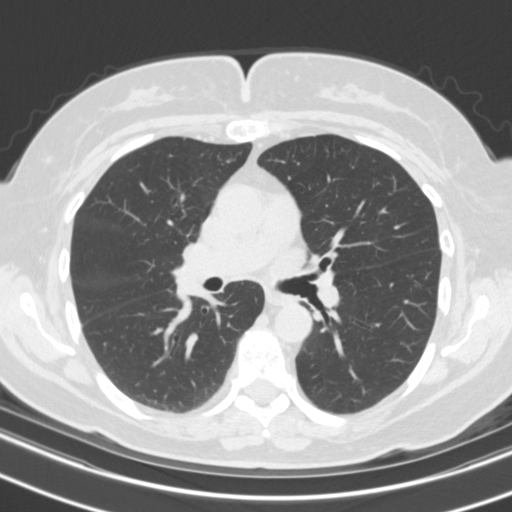
[im 93/139  lung]
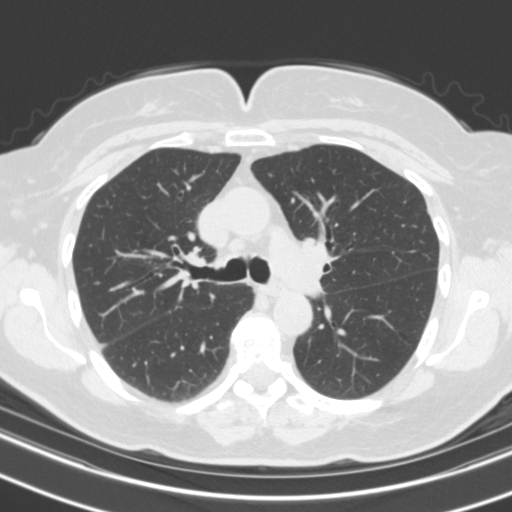
[im 103/139  lung]
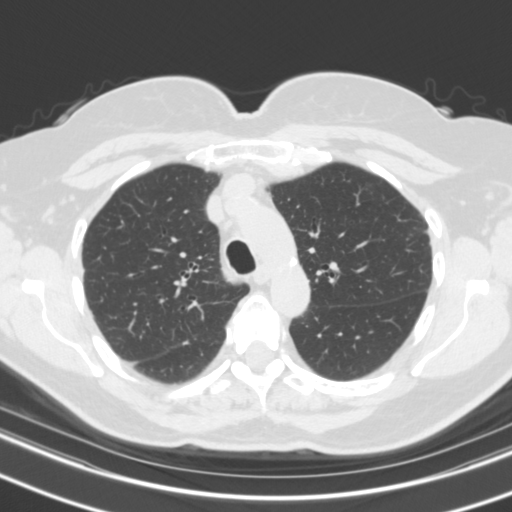
[im 111/139  mediastinal]
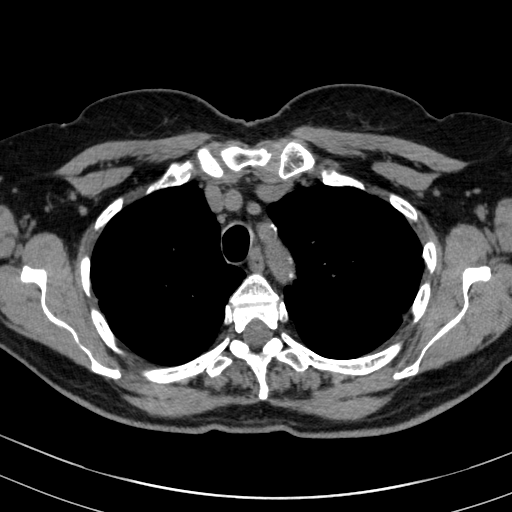
[im 111/139  lung]
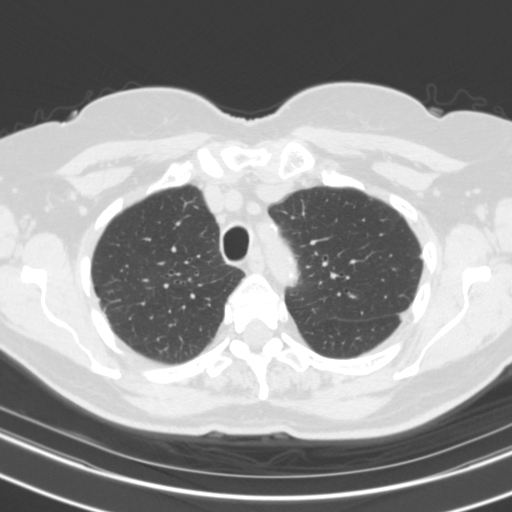
[im 118/139  lung]
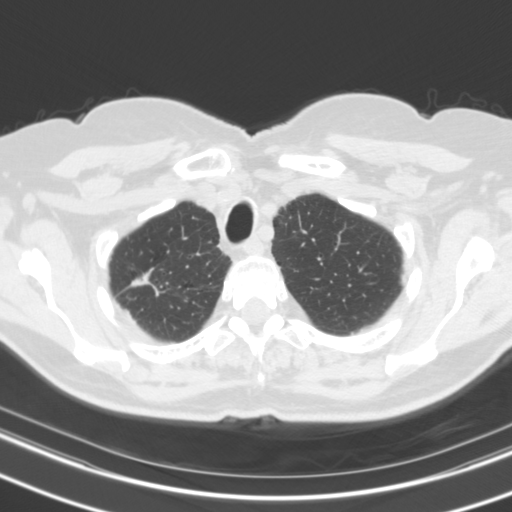
[im 128/139  lung]
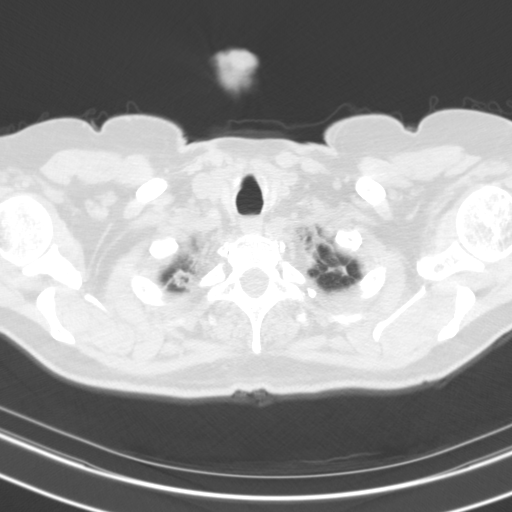

[15 of 34 positions shown; findings below may reference images not displayed]

FINDINGS: Cardiovascular: Atherosclerotic calcifications aorta and coronary
arteries. Aorta normal caliber. No pericardial effusion.

Mediastinum/Nodes: Small hiatal hernia. Esophagus unremarkable for
technique. Base of cervical region normal appearance. Few normal
size mediastinal lymph nodes without thoracic adenopathy.

Lungs/Pleura: Focus of scarring at the RIGHT upper lobe 13 mm
greatest diameter unchanged. 5 mm RIGHT lower lobe nodule image 67
unchanged. 3 mm RIGHT middle lobe nodule image 90 unchanged. Tiny
nodule at anterior LEFT upper lobe image 24 unchanged, question
calcified granuloma on prior exam. LEFT lower lobe nodules 5 mm
diameter image 74 and 4 mm diameter image 76 unchanged. No acute
infiltrate, pleural effusion, pneumothorax or new mass/nodule.
Minimal scattered peripheral scarring. Tiny calcified granuloma
image 34 in LEFT upper lobe.

Upper Abdomen: Visualized upper abdomen unremarkable.

Musculoskeletal: No acute osseous findings.
IMPRESSION: BILATERAL pulmonary nodules and few calcified pulmonary granulomata
unchanged since 6944 indicative of a benign process.

RIGHT apical scarring.

No acute intrathoracic abnormalities.

Small hiatal hernia.

Aortic Atherosclerosis (Y2B72-MS5.5).

## 2019-07-27 ENCOUNTER — Other Ambulatory Visit: Payer: Self-pay

## 2019-07-27 ENCOUNTER — Encounter: Payer: Self-pay | Admitting: Cardiology

## 2019-07-27 ENCOUNTER — Ambulatory Visit: Payer: Medicare Other | Admitting: Cardiology

## 2019-07-27 VITALS — BP 130/70 | HR 65 | Ht 62.0 in | Wt 132.4 lb

## 2019-07-27 DIAGNOSIS — E7800 Pure hypercholesterolemia, unspecified: Secondary | ICD-10-CM

## 2019-07-27 DIAGNOSIS — Z8249 Family history of ischemic heart disease and other diseases of the circulatory system: Secondary | ICD-10-CM

## 2019-07-27 DIAGNOSIS — R0789 Other chest pain: Secondary | ICD-10-CM | POA: Diagnosis not present

## 2019-07-27 DIAGNOSIS — R072 Precordial pain: Secondary | ICD-10-CM

## 2019-07-27 DIAGNOSIS — Z01812 Encounter for preprocedural laboratory examination: Secondary | ICD-10-CM

## 2019-07-27 MED ORDER — METOPROLOL TARTRATE 50 MG PO TABS: 50.0000 mg | ORAL_TABLET | Freq: Once | ORAL | 0 refills | Status: DC
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
## 2019-07-27 NOTE — Patient Instructions (Addendum)
Medication Instructions:  The current medical regimen is effective;  continue present plan and medications.  *If you need a refill on your cardiac medications before your next appointment, please call your pharmacy*  Lab Work: Please have lab work once your CT has been scheduled. (BMP)  If you have labs (blood work) drawn today and your tests are completely normal, you will receive your results only by: Marland Kitchen MyChart Message (if you have MyChart) OR . A paper copy in the mail If you have any lab test that is abnormal or we need to change your treatment, we will call you to review the results.  Testing/Procedures: Your physician has requested that you have Coronary CT. Cardiac computed tomography (CT) is a painless test that uses an x-ray machine to take clear, detailed pictures of your heart.   Follow-Up: Follow up to be determined after the above testing has been completed.  Thank you for choosing [facility]!!    Your cardiac CT will be scheduled at one of the below locations:   [facility] [address] [place], [place] [address] [phone]  Please arrive at the [facility] main entrance of [facility] 30 minutes prior to test start time. Proceed to the [facility] Radiology Department (first floor) to check-in and test prep.  Please follow these instructions carefully (unless otherwise directed):  Hold all erectile dysfunction medications at least 3 days (72 hrs) prior to test.  On the Night Before the Test: . Be sure to Drink plenty of water. . Do not consume any caffeinated/decaffeinated beverages or chocolate 12 hours prior to your test. . Do not take any antihistamines 12 hours prior to your test. . If you take Metformin do not take 24 hours prior to test.  On the Day of the Test: . Drink plenty of water. Do not drink any water within one hour of the test. . Do not eat any food 4 hours prior to the test. . You may take your regular  medications prior to the test.  . Take metoprolol (Lopressor) two hours prior to test. . HOLD Furosemide/Hydrochlorothiazide morning of the test. . FEMALES- please wear underwire-free bra if available      After the Test: . Drink plenty of water. . After receiving IV contrast, you may experience a mild flushed feeling. This is normal. . On occasion, you may experience a mild rash up to 24 hours after the test. This is not dangerous. If this occurs, you can take Benadryl 25 mg and increase your fluid intake. . If you experience trouble breathing, this can be serious. If it is severe call 911 IMMEDIATELY. If it is mild, please call our office. . If you take any of these medications: Glipizide/Metformin, Avandament, Glucavance, please do not take 48 hours after completing test unless otherwise instructed.   Once we have confirmed authorization from your insurance company, we will call you to set up a date and time for your test.   For non-scheduling related questions, please contact the cardiac imaging nurse navigator should you have any questions/concerns: Rockwell Alexandria, RN Navigator Cardiac Imaging [facility] [phone] office  For scheduling needs, including cancellations and rescheduling, please call [phone].

## 2019-07-28 NOTE — Addendum Note (Signed)
Addended by: Burnetta Sabin on: 07/28/2019 12:25 PM   Modules accepted: Orders

## 2019-08-04 ENCOUNTER — Telehealth: Payer: Self-pay | Admitting: Cardiology

## 2019-08-04 ENCOUNTER — Other Ambulatory Visit: Payer: Medicare Other | Admitting: *Deleted

## 2019-08-04 ENCOUNTER — Other Ambulatory Visit: Payer: Self-pay

## 2019-08-04 DIAGNOSIS — R0789 Other chest pain: Secondary | ICD-10-CM

## 2019-08-04 DIAGNOSIS — Z01812 Encounter for preprocedural laboratory examination: Secondary | ICD-10-CM

## 2019-08-04 LAB — BASIC METABOLIC PANEL
BUN/Creatinine Ratio: 9 — ABNORMAL LOW (ref 12–28)
BUN: 8 mg/dL (ref 8–27)
CO2: 23 mmol/L (ref 20–29)
Calcium: 9.1 mg/dL (ref 8.7–10.3)
Chloride: 107 mmol/L — ABNORMAL HIGH (ref 96–106)
Creatinine, Ser: 0.91 mg/dL (ref 0.57–1.00)
GFR calc Af Amer: 72 mL/min/{1.73_m2} (ref >59)
GFR calc non Af Amer: 62 mL/min/{1.73_m2} (ref >59)
Glucose: 85 mg/dL (ref 65–99)
Potassium: 4 mmol/L (ref 3.5–5.2)
Sodium: 140 mmol/L (ref 134–144)

## 2019-08-04 NOTE — Telephone Encounter (Signed)
I spoke to the patient and reviewed Metoprolol instructions for CT and instructed her not to hold Atenolol.  She verbalized understanding.

## 2019-08-04 NOTE — Telephone Encounter (Signed)
Pt c/o medication issue:  1. Name of Medication: atenolol (TENORMIN) 50 MG tablet  2. How are you currently taking this medication (dosage and times per day)?  50 mg by mouth daily   3. Are you having a reaction (difficulty breathing--STAT)? No  4. What is your medication issue? Patient states she currently takes atenolol (TENORMIN) 50 MG tablet medication every night. She has been advise to take 1   metoprolol tartrate (LOPRESSOR) 50 MG   tablet 2 hours prior to CT scheduled for 08/18/19. However, when she called the pharmacy for medication they questioned whether or not she needs to hold atenolol (TENORMIN) 50 MG tablet medication. Please advise.

## 2019-08-16 ENCOUNTER — Telehealth (HOSPITAL_COMMUNITY): Payer: Self-pay | Admitting: Emergency Medicine

## 2019-08-16 NOTE — Telephone Encounter (Signed)
Reaching out to patient to offer assistance regarding upcoming cardiac imaging study; pt verbalizes understanding of appt date/time, parking situation and where to check in, pre-test NPO status and medications ordered, and verified current allergies; name and call back number provided for further questions should they arise   RN Navigator Cardiac Imaging [facility] [phone] office [phone] cell 

## 2019-08-18 ENCOUNTER — Other Ambulatory Visit: Payer: Self-pay

## 2019-08-18 ENCOUNTER — Ambulatory Visit (HOSPITAL_COMMUNITY)
Admission: RE | Admit: 2019-08-18 | Discharge: 2019-08-18 | Disposition: A | Discharge status: Home / Self Care | Payer: Medicare Other | Source: Ambulatory Visit | Attending: Cardiology | Admitting: Cardiology

## 2019-08-18 DIAGNOSIS — R072 Precordial pain: Secondary | ICD-10-CM

## 2019-08-18 DIAGNOSIS — Z8249 Family history of ischemic heart disease and other diseases of the circulatory system: Secondary | ICD-10-CM

## 2019-08-18 DIAGNOSIS — I2510 Atherosclerotic heart disease of native coronary artery without angina pectoris: Secondary | ICD-10-CM | POA: Diagnosis not present

## 2019-08-18 DIAGNOSIS — E7800 Pure hypercholesterolemia, unspecified: Secondary | ICD-10-CM | POA: Diagnosis not present

## 2019-08-18 DIAGNOSIS — I700 Atherosclerosis of aorta: Secondary | ICD-10-CM | POA: Diagnosis not present

## 2019-08-18 MED ORDER — IOHEXOL 350 MG/ML SOLN
80.0000 mL | Freq: Once | INTRAVENOUS | Status: AC | PRN
  Administered 2019-08-18: 80 mL via INTRAVENOUS

## 2019-08-18 MED ORDER — NITROGLYCERIN 0.4 MG SL SUBL
SUBLINGUAL_TABLET | SUBLINGUAL | Status: AC
  Filled 2019-08-18: qty 2

## 2019-08-18 MED ORDER — NITROGLYCERIN 0.4 MG SL SUBL
0.8000 mg | SUBLINGUAL_TABLET | Freq: Once | SUBLINGUAL | Status: AC
  Administered 2019-08-18: 0.8 mg via SUBLINGUAL

## 2019-08-19 ENCOUNTER — Telehealth: Payer: Self-pay | Admitting: Cardiology

## 2019-08-19 DIAGNOSIS — E7800 Pure hypercholesterolemia, unspecified: Secondary | ICD-10-CM

## 2019-08-19 DIAGNOSIS — Z8249 Family history of ischemic heart disease and other diseases of the circulatory system: Secondary | ICD-10-CM | POA: Diagnosis not present

## 2019-08-19 DIAGNOSIS — R072 Precordial pain: Secondary | ICD-10-CM | POA: Diagnosis not present

## 2019-08-19 MED ORDER — ROSUVASTATIN CALCIUM 20 MG PO TABS
20.0000 mg | ORAL_TABLET | Freq: Every day | ORAL | 3 refills | Status: DC
Start: 2019-08-19 — End: 2019-08-26

## 2019-08-19 NOTE — Addendum Note (Signed)
Addended by: Sharin Grave on: 08/19/2019 01:23 PM   Modules accepted: Orders

## 2019-08-19 NOTE — Telephone Encounter (Signed)
Spoke with patient regarding results.  She is anxious to schedule to be seen and discuss results with Dr Anne Fu.  Scheduled her for Friday 5/14 at 11:40 am.  She is agreeable to stop Simvastatin  and start Crestor 20 mg daily.  RX to be into pharmacy of choice ([facility])

## 2019-08-19 NOTE — Telephone Encounter (Signed)
Patient states that Dr. Anne Fu wanted her to f/u with him after her CT results. I stated that Dr. Anne Fu next appt was 09/08/19, she thought that was too far out. I asked if she would like to schedule with an APP but she didn't know if Dr. Anne Fu would like that considering he was the one who wanted to discuss the results with her. Please advise.

## 2019-08-26 ENCOUNTER — Ambulatory Visit: Payer: Medicare Other | Admitting: Cardiology

## 2019-08-26 ENCOUNTER — Other Ambulatory Visit: Payer: Self-pay

## 2019-08-26 ENCOUNTER — Encounter: Payer: Self-pay | Admitting: Cardiology

## 2019-08-26 VITALS — BP 110/60 | HR 65 | Ht 62.0 in | Wt 131.0 lb

## 2019-08-26 DIAGNOSIS — Z8249 Family history of ischemic heart disease and other diseases of the circulatory system: Secondary | ICD-10-CM | POA: Diagnosis not present

## 2019-08-26 DIAGNOSIS — E7800 Pure hypercholesterolemia, unspecified: Secondary | ICD-10-CM

## 2019-08-26 MED ORDER — ROSUVASTATIN CALCIUM 40 MG PO TABS: 40.0000 mg | ORAL_TABLET | Freq: Every day | ORAL | 2 refills | Status: DC

## 2019-08-26 MED ORDER — ASPIRIN EC 81 MG PO TBEC: 81.0000 mg | DELAYED_RELEASE_TABLET | Freq: Every day | ORAL | 3 refills | Status: AC
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
## 2019-08-26 NOTE — Patient Instructions (Signed)
Medication Instructions:   START TAKING ASPIRIN 81 MG BY MOUTH DAILY  INCREASE YOUR ROSUVASTATIN TO 40 MG BY MOUTH DAILY  *If you need a refill on your cardiac medications before your next appointment, please call your pharmacy*   Lab Work:  IN 3 MONTHS HERE AT THE OFFICE---ALT AND LIPIDS--COME FASTING TO THIS LAB APPT  If you have labs (blood work) drawn today and your tests are completely normal, you will receive your results only by: Marland Kitchen MyChart Message (if you have MyChart) OR . A paper copy in the mail If you have any lab test that is abnormal or we need to change your treatment, we will call you to review the results.   Follow-Up: At [facility], you and your health needs are our priority.  As part of our continuing mission to provide you with exceptional heart care, we have created designated Provider Care Teams.  These Care Teams include your primary Cardiologist (physician) and Advanced Practice Providers (APPs -  Physician Assistants and Nurse Practitioners) who all work together to provide you with the care you need, when you need it.  We recommend signing up for the patient portal called "MyChart".  Sign up information is provided on this After Visit Summary.  MyChart is used to connect with patients for Virtual Visits (Telemedicine).  Patients are able to view lab/test results, encounter notes, upcoming appointments, etc.  Non-urgent messages can be sent to your provider as well.   To learn more about what you can do with MyChart, go to [email or web address].    Your next appointment:   6 month(s)  The format for your next appointment:   In Person  Provider:   Donato Schultz, MD

## 2019-08-29 ENCOUNTER — Telehealth: Payer: Self-pay | Admitting: Cardiology

## 2019-08-29 NOTE — Telephone Encounter (Signed)
Called patient. Informed her that we would send her a Mychart message of the DASH diet and if she has any questions to let our office know.

## 2019-08-29 NOTE — Telephone Encounter (Signed)
Patient calling requesting advice on what she can and cannot eat.

## 2019-09-02 ENCOUNTER — Telehealth: Payer: Self-pay | Admitting: Cardiology

## 2019-09-02 NOTE — Telephone Encounter (Signed)
   Pt said she will have a blood work done with her PCP Dr. Hyacinth Meeker on 07/28 she wanted to know if Dr. Hyacinth Meeker just send Dr. Anne Fu blood work result if she can just cancel her labs on 08/13  Please advise

## 2019-09-02 NOTE — Telephone Encounter (Signed)
Pt is getting labs drawn by PCP and wanted to know If she needs to keep her lab appt with our office in August. I advised the patient to keep the August lab appt and have Dr. Hyacinth Meeker send over her lab results to Dr. Anne Fu and then he can advise further after reviewing the results. She agreed and verbalized understanding.

## 2019-09-06 ENCOUNTER — Ambulatory Visit: Payer: Medicare Other | Admitting: Orthopaedic Surgery

## 2019-09-07 ENCOUNTER — Other Ambulatory Visit: Payer: Self-pay

## 2019-09-07 ENCOUNTER — Ambulatory Visit: Payer: Self-pay

## 2019-09-07 ENCOUNTER — Ambulatory Visit: Payer: Medicare Other | Admitting: Orthopaedic Surgery

## 2019-09-07 ENCOUNTER — Encounter: Payer: Self-pay | Admitting: Orthopaedic Surgery

## 2019-09-07 DIAGNOSIS — M1812 Unilateral primary osteoarthritis of first carpometacarpal joint, left hand: Secondary | ICD-10-CM

## 2019-09-07 NOTE — Progress Notes (Signed)
   Office Visit Note   Patient: Toni Rich           Date of Birth: [birth year]           MRN: [id number] Visit Date: 09/07/2019              Requested by: Sigmund Hazel, MD [address] [place],  [place] [address] PCP: Sigmund Hazel, MD   Assessment & Plan: Visit Diagnoses:  1. Primary osteoarthritis of first carpometacarpal joint of left hand     Plan: Updated x-rays were reviewed with the patient today.  Based on our discussion of continued conservative treatment versus surgical treatment and the risk benefits rehab recovery and alternatives to surgery Toni Rich has elected to proceed with [facility] arthroplasty.  We will schedule the surgery in the near future per her convenience.  Follow-Up Instructions: Return if symptoms worsen or fail to improve.   Orders:  Orders Placed This Encounter  Procedures  . XR Finger Thumb Left   No orders of the defined types were placed in this encounter.     Procedures: No procedures performed   Clinical Data: No additional findings.   Subjective: Chief Complaint  Patient presents with  . Left Thumb - Pain    Raziah returns today for follow-up of her left hand basal joint arthrosis.  She is still having continued pain.  She feels that the last shot gave her about 3 months of relief.  No changes otherwise.   Review of Systems   Objective: Vital Signs: There were no vitals taken for this visit.  Physical Exam  Ortho Exam Left hand and thumb exams are unchanged. Specialty Comments:  No specialty comments available.  Imaging: XR Finger Thumb Left  Result Date: 09/07/2019 End-stage basal joint arthrosis    PMFS History: Patient Active Problem List   Diagnosis Date Noted  . Right thigh pain 03/25/2019  . Right knee pain 10/28/2018  . Primary osteoarthritis of first carpometacarpal joint of left hand 10/28/2018  . Hypokalemia 12/17/2011  . Bronchitis 12/17/2011  . Bilateral atelectasis 12/15/2011  . Colitis 12/13/2011   . Leukocytosis 12/13/2011   Past Medical History:  Diagnosis Date  . Anxiety   . Colonic polyp   . Depression   . Hypercholesteremia   . Hypothyroid   . IBS (irritable bowel syndrome)   . Migraines   . Palpitations   . Peripheral neuropathy     Family History  Problem Relation Age of Onset  . Heart attack Sister     Past Surgical History:  Procedure Laterality Date  . CESAREAN SECTION    . TUBAL LIGATION     Social History   Occupational History  . Not on file  Tobacco Use  . Smoking status: Former Games developer  . Smokeless tobacco: Never Used  Substance and Sexual Activity  . Alcohol use: No  . Drug use: No  . Sexual activity: Not Currently

## 2019-09-08 ENCOUNTER — Telehealth: Payer: Self-pay | Admitting: Cardiology

## 2019-09-08 ENCOUNTER — Ambulatory Visit: Payer: Medicare Other | Admitting: Cardiology

## 2019-09-08 NOTE — Telephone Encounter (Signed)
New Message  Pt c/o of Chest Pain: STAT if CP now or developed within 24 hours  1. Are you having CP right now? No  2. Are you experiencing any other symptoms (ex. SOB, nausea, vomiting, sweating)? No  3. How long have you been experiencing CP? Two weeks ago on the exercise bike then last night at church.   4. Is your CP continuous or coming and going? Coming and Going  5. Have you taken Nitroglycerin? No ?

## 2019-09-08 NOTE — Telephone Encounter (Signed)
Should pt start Isosorbide as mentioned as a possibility in the future at her last office visit?  Will have Dr Anne Fu review and call pt back in f/u.

## 2019-09-08 NOTE — Telephone Encounter (Signed)
  Received OP page from Ms. Toni Rich regarding adding IMDUR to her regimen. She was wanting to speak with Dr. Anne Fu, however I explained that he was unavailable at this time. She would like his opinion on starting IMDUR therefore I will forward this message to his team. Offered to start this for her based on their last encounter but she declined.    [name] NP-C [facility] Pager: [phone]

## 2019-09-08 NOTE — Telephone Encounter (Signed)
I spoke to the patient who called because since her OV with Dr Anne Fu on 5/14, she has had two episodes of "chest discomfort".  She rates it at 3/10 scale and lasts only a few moments.  One episode occurred while riding an exercise bike and the other was last night at church, while sitting.    It was recommended at the OV to try Isosorbide, if pain recurs.

## 2019-09-09 MED ORDER — ISOSORBIDE MONONITRATE ER 30 MG PO TB24
30.0000 mg | ORAL_TABLET | Freq: Every day | ORAL | 3 refills | Status: DC
Start: 2019-09-09 — End: 2019-12-01

## 2019-09-09 NOTE — Telephone Encounter (Signed)
OK to start Imdur 30mg  PO QD , MD

## 2019-09-09 NOTE — Telephone Encounter (Signed)
PT AWARE OF RECOMMENDATIONS AND SCRIPT SENT [facility]IA [facility] TO [facility] [address] .Zack Seal

## 2019-09-09 NOTE — Telephone Encounter (Signed)
Patient calling back to follow up.

## 2019-09-09 NOTE — Telephone Encounter (Signed)
OK to start Imdur Donato Schultz, MD

## 2019-10-11 ENCOUNTER — Telehealth: Payer: Self-pay

## 2019-10-11 NOTE — Telephone Encounter (Signed)
Spoke to her and answered her questions.  Please call her to schedule.  She has questions for you also.

## 2019-10-11 NOTE — Telephone Encounter (Signed)
Talking to patient about surgery for the end of July.  She has a lot of questions about time frames, etc.  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  Please call [phone] .  Thanks!

## 2019-10-18 NOTE — Telephone Encounter (Signed)
Spoke with patient on Friday and scheduled surgery. ?

## 2019-11-10 ENCOUNTER — Other Ambulatory Visit: Payer: Self-pay | Admitting: Orthopaedic Surgery

## 2019-11-10 DIAGNOSIS — M1812 Unilateral primary osteoarthritis of first carpometacarpal joint, left hand: Secondary | ICD-10-CM | POA: Diagnosis not present

## 2019-11-10 MED ORDER — OXYCODONE-ACETAMINOPHEN 5-325 MG PO TABS: 1.0000 | ORAL_TABLET | Freq: Three times a day (TID) | ORAL | 0 refills | Status: DC | PRN

## 2019-11-14 ENCOUNTER — Telehealth: Payer: Self-pay

## 2019-11-14 ENCOUNTER — Telehealth: Payer: Self-pay | Admitting: Orthopaedic Surgery

## 2019-11-14 NOTE — Telephone Encounter (Signed)
Wait 2 weeks after hand surgery to have cataract surgery

## 2019-11-14 NOTE — Telephone Encounter (Signed)
See other msg

## 2019-11-14 NOTE — Telephone Encounter (Signed)
Called patient. She is aware.   

## 2019-11-14 NOTE — Telephone Encounter (Signed)
Patient called in wanting to know if she should keep her cataract surgery on august 10th  Or not .

## 2019-11-14 NOTE — Telephone Encounter (Signed)
Patient called advised she is scheduled to have cataract surgery 11/22/2019 and wanted to know if it's ok with Dr Roda Shutters or does she need to reschedule the cataract surgery? The number to contact patient is [phone]

## 2019-11-23 ENCOUNTER — Encounter: Payer: Self-pay | Admitting: Orthopaedic Surgery

## 2019-11-23 ENCOUNTER — Ambulatory Visit (INDEPENDENT_AMBULATORY_CARE_PROVIDER_SITE_OTHER): Payer: Medicare Other | Admitting: Orthopaedic Surgery

## 2019-11-23 ENCOUNTER — Ambulatory Visit: Payer: Medicare Other | Admitting: Orthopaedic Surgery

## 2019-11-23 ENCOUNTER — Ambulatory Visit (INDEPENDENT_AMBULATORY_CARE_PROVIDER_SITE_OTHER): Payer: Medicare Other

## 2019-11-23 DIAGNOSIS — M1812 Unilateral primary osteoarthritis of first carpometacarpal joint, left hand: Secondary | ICD-10-CM

## 2019-11-23 NOTE — Progress Notes (Signed)
   Post-Op Visit Note   Patient: Toni Rich           Date of Birth: 1946-02-14           MRN: [id number] Visit Date: 11/23/2019 PCP: Sigmund Hazel, MD   Assessment & Plan:  Chief Complaint:  Chief Complaint  Patient presents with  . Left Thumb - Pain   Visit Diagnoses:  1. Primary osteoarthritis of first carpometacarpal joint of left hand     Plan: Patient is a pleasant 74 year old female who comes in today approximately 2 weeks out left thumb CMC arthroplasty 11/10/2019. She has been doing fairly well. She notes moderate soreness to the thumb and hand. She has been taking a narcotic pain pill at night which does seem to relieve her symptoms. No fevers or chills. No numbness, tingling or burning. Examination of her left hand reveals well-healing surgical incisions with nylon sutures in place. No evidence of infection or cellulitis. Today, nylon sutures were removed and Steri-Strips applied. We will place the patient in a thumb spica cast for the next 2 weeks. She will follow-up with [place] in 2 weeks time when she is 4 weeks out. At that point we will transition her into a removable thumb spica splint and start her in hand therapy. She will call with concerns or questions in the meantime.  Follow-Up Instructions: Return in about 2 weeks (around 12/07/2019).   Orders:  Orders Placed This Encounter  Procedures  . XR Hand Complete Left   No orders of the defined types were placed in this encounter.   Imaging: XR Hand Complete Left  Result Date: 11/23/2019 Status post trapeziectomy and [facility] arthroplasty with intact tightrope.     PMFS History: Patient Active Problem List   Diagnosis Date Noted  . Right thigh pain 03/25/2019  . Right knee pain 10/28/2018  . Primary osteoarthritis of first carpometacarpal joint of left hand 10/28/2018  . Hypokalemia 12/17/2011  . Bronchitis 12/17/2011  . Bilateral atelectasis 12/15/2011  . Colitis 12/13/2011  . Leukocytosis 12/13/2011   Past  Medical History:  Diagnosis Date  . Anxiety   . Colonic polyp   . Depression   . Hypercholesteremia   . Hypothyroid   . IBS (irritable bowel syndrome)   . Migraines   . Palpitations   . Peripheral neuropathy     Family History  Problem Relation Age of Onset  . Heart attack Sister     Past Surgical History:  Procedure Laterality Date  . CESAREAN SECTION    . TUBAL LIGATION     Social History   Occupational History  . Not on file  Tobacco Use  . Smoking status: Former Games developer  . Smokeless tobacco: Never Used  Vaping Use  . Vaping Use: Never used  Substance and Sexual Activity  . Alcohol use: No  . Drug use: No  . Sexual activity: Not Currently

## 2019-11-24 ENCOUNTER — Ambulatory Visit: Payer: Medicare Other | Admitting: Orthopaedic Surgery

## 2019-11-24 ENCOUNTER — Telehealth: Payer: Self-pay | Admitting: Cardiology

## 2019-11-24 NOTE — Telephone Encounter (Signed)
Spoke with pt and made her aware that what she saw is where her PCP labs were scanned into the system.  Advised Dr. Anne Fu has not reviewed yet.  Advised labs look normal and we will call back if Dr. Anne Fu has any instructions once reviewed.  Scheduled her for her 6 mo f/u in Nov.

## 2019-11-24 NOTE — Telephone Encounter (Signed)
New Message:   Pt said she received a message in My-Chart, but she did not understand the message>

## 2019-11-24 NOTE — Telephone Encounter (Signed)
Attempted to contact pt.  Appears labs were scanned in from [facility] but no remarks on there from Dr. Anne Fu.  Phone rang several times with no answer and then ringing came on like a fax machine.  Unable to leave message.

## 2019-11-25 ENCOUNTER — Other Ambulatory Visit: Payer: Medicare Other

## 2019-11-29 ENCOUNTER — Encounter (INDEPENDENT_AMBULATORY_CARE_PROVIDER_SITE_OTHER): Payer: No Typology Code available for payment source | Admitting: Ophthalmology

## 2019-11-30 ENCOUNTER — Encounter (INDEPENDENT_AMBULATORY_CARE_PROVIDER_SITE_OTHER): Payer: No Typology Code available for payment source | Admitting: Ophthalmology

## 2019-12-01 ENCOUNTER — Other Ambulatory Visit: Payer: Self-pay | Admitting: Cardiology

## 2019-12-07 ENCOUNTER — Other Ambulatory Visit: Payer: Self-pay

## 2019-12-07 ENCOUNTER — Encounter (INDEPENDENT_AMBULATORY_CARE_PROVIDER_SITE_OTHER): Payer: Medicare Other | Admitting: Ophthalmology

## 2019-12-07 ENCOUNTER — Ambulatory Visit: Payer: Medicare Other | Admitting: Orthopaedic Surgery

## 2019-12-07 DIAGNOSIS — H2513 Age-related nuclear cataract, bilateral: Secondary | ICD-10-CM | POA: Diagnosis not present

## 2019-12-07 DIAGNOSIS — H43813 Vitreous degeneration, bilateral: Secondary | ICD-10-CM

## 2019-12-07 DIAGNOSIS — H353132 Nonexudative age-related macular degeneration, bilateral, intermediate dry stage: Secondary | ICD-10-CM | POA: Diagnosis not present

## 2019-12-08 ENCOUNTER — Ambulatory Visit (INDEPENDENT_AMBULATORY_CARE_PROVIDER_SITE_OTHER): Payer: Medicare Other | Admitting: Orthopaedic Surgery

## 2019-12-08 ENCOUNTER — Encounter: Payer: Self-pay | Admitting: Orthopaedic Surgery

## 2019-12-08 ENCOUNTER — Telehealth: Payer: Self-pay

## 2019-12-08 DIAGNOSIS — M1812 Unilateral primary osteoarthritis of first carpometacarpal joint, left hand: Secondary | ICD-10-CM

## 2019-12-08 MED ORDER — HYDROCODONE-ACETAMINOPHEN 5-325 MG PO TABS: 1.0000 | ORAL_TABLET | Freq: Every day | ORAL | 0 refills | Status: DC | PRN

## 2019-12-08 NOTE — Telephone Encounter (Signed)
Patient called in wanting to know if she should take  Stint off at night or is she able to sleep with it on

## 2019-12-08 NOTE — Telephone Encounter (Signed)
See message. Please advise.

## 2019-12-08 NOTE — Progress Notes (Signed)
Patient ID: Toni Rich, female   DOB: June 02, 1945, 74 y.o.   MRN: [id number]  [name] is 4 weeks status post left thumb CMC arthroplasty.  Pain is mild.  She no longer is taking any opiate pain medications.  Comes in for cast removal today.  Surgical scars are fully healed.  She has expected stiffness of the thumb MP and IP joints.  No neurovascular compromise.  Norco refilled today for hand therapy sessions.  Velcro thumb spica brace given today.  Recheck in 6 weeks.  Questions encouraged and answered.

## 2019-12-08 NOTE — Telephone Encounter (Signed)
Yes she can take off to sleep

## 2019-12-08 NOTE — Telephone Encounter (Signed)
Called patient no answer LMOM  with details.- okay to take brace off to sleep

## 2019-12-14 ENCOUNTER — Encounter (INDEPENDENT_AMBULATORY_CARE_PROVIDER_SITE_OTHER): Payer: No Typology Code available for payment source | Admitting: Ophthalmology

## 2019-12-15 ENCOUNTER — Telehealth: Payer: Self-pay | Admitting: Orthopaedic Surgery

## 2019-12-15 NOTE — Telephone Encounter (Signed)
Please advise. See message below.  

## 2019-12-15 NOTE — Telephone Encounter (Signed)
Yes she can take off the brace occasionally.  I think it is fine to attend a week or 2 of hand therapy to make sure that she is progressing along and that she is doing the exercises correctly before doing them at home.

## 2019-12-15 NOTE — Telephone Encounter (Signed)
Pt states she had carpal tunnel surgery and her PT is about to start, however she can't find anyone to cover her at work; the pt would like to know if she could do home PT instead? She states she does know a PT nurse who is willing to work with her and works at an orthopedic office in [place], Pt would also like to know if she would be able to take off her brace occasionally? Please CB with these answers  [phone]

## 2019-12-16 NOTE — Telephone Encounter (Signed)
I called and advised pt of message below. Voiced understanding and will call with questions.

## 2019-12-20 ENCOUNTER — Telehealth: Payer: Self-pay | Admitting: Orthopaedic Surgery

## 2019-12-20 NOTE — Telephone Encounter (Signed)
Patient called. She would like OT on her hand. She would like Lis to call her. [phone]

## 2019-12-20 NOTE — Telephone Encounter (Signed)
[facility] [address].  Thanks.

## 2019-12-20 NOTE — Telephone Encounter (Signed)
Where would you like me to send her?

## 2019-12-21 ENCOUNTER — Other Ambulatory Visit: Payer: Self-pay

## 2019-12-21 DIAGNOSIS — M1812 Unilateral primary osteoarthritis of first carpometacarpal joint, left hand: Secondary | ICD-10-CM

## 2019-12-21 NOTE — Telephone Encounter (Signed)
Sent order.

## 2019-12-28 ENCOUNTER — Telehealth: Payer: Self-pay | Admitting: Orthopaedic Surgery

## 2019-12-28 NOTE — Telephone Encounter (Signed)
Patient called requesting a call back. Patient states she had surgery on thumb and had physical therapy. Patient has medical questions and need Marisue Ivan to call her. Patient wants to know if she can have home health physical therapy. Please call patient about this matter at [phone].

## 2019-12-29 ENCOUNTER — Telehealth: Payer: Self-pay | Admitting: Orthopaedic Surgery

## 2019-12-29 NOTE — Telephone Encounter (Signed)
Patient called requesting an update about Dr. Roda Shutters decision to allow home health physical therapy. Patient phone number [phone].

## 2019-12-30 NOTE — Telephone Encounter (Signed)
I called pt to advise of message below however she is wanting to know what she needs to do in order to get a copy of her medical records. Can you please call and advise.

## 2019-12-30 NOTE — Telephone Encounter (Signed)
FYI- I called pt and she said that she would like to work on therapy on her own at home. 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 Her follow up is 01/19/20 with Dr. Roda Shutters and can decide if she is progressing enough at that appt.

## 2020-01-02 NOTE — Telephone Encounter (Signed)
IC patient, advised needs to sign authorization for records form. I faxed to patient per her request, fax [phone]

## 2020-01-19 ENCOUNTER — Ambulatory Visit: Payer: Medicare Other | Admitting: Orthopaedic Surgery

## 2020-01-26 ENCOUNTER — Ambulatory Visit (INDEPENDENT_AMBULATORY_CARE_PROVIDER_SITE_OTHER): Payer: Medicare Other | Admitting: Physician Assistant

## 2020-01-26 ENCOUNTER — Encounter: Payer: Self-pay | Admitting: Orthopaedic Surgery

## 2020-01-26 DIAGNOSIS — M1812 Unilateral primary osteoarthritis of first carpometacarpal joint, left hand: Secondary | ICD-10-CM

## 2020-01-26 MED ORDER — TRAMADOL HCL 50 MG PO TABS: 50.0000 mg | ORAL_TABLET | Freq: Two times a day (BID) | ORAL | 0 refills | Status: DC | PRN

## 2020-01-26 NOTE — Progress Notes (Signed)
   Post-Op Visit Note   Patient: Toni Rich           Date of Birth: 1945/07/31           MRN: [id number] Visit Date: 01/26/2020 PCP: Sigmund Hazel, MD   Assessment & Plan:  Chief Complaint:  Chief Complaint  Patient presents with  . Left Thumb - Pain   Visit Diagnoses:  1. Primary osteoarthritis of first carpometacarpal joint of left hand     Plan: Patient is a pleasant 74 year old female who presents our clinic today approximately 1 weeks out left first [facility] arthroplasty 11/10/2019.  She has been doing well.  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  She has been working on exercises on her own and has progressed quite well.  She has not been wearing any sort of splint.  Examination of her left hand reveals near full range of motion of the thumb and wrist.  She is neurovascularly intact distally.  At this point, she will continue with her exercises.  She may start to work on resistance exercises as well.  I called in a prescription of tramadol to take as needed.  She will follow up with [place] in 5 weeks time when she is 16 weeks out from surgery.  Call with concerns or questions in the meantime.  Follow-Up Instructions: Return in about 5 weeks (around 03/01/2020).   Orders:  No orders of the defined types were placed in this encounter.  Meds ordered this encounter  Medications  . traMADol (ULTRAM) 50 MG tablet    Sig: Take 1 tablet (50 mg total) by mouth 2 (two) times daily as needed.    Dispense:  30 tablet    Refill:  0    Imaging: No new imaging  PMFS History: Patient Active Problem List   Diagnosis Date Noted  . Right thigh pain 03/25/2019  . Right knee pain 10/28/2018  . Primary osteoarthritis of first carpometacarpal joint of left hand 10/28/2018  . Hypokalemia 12/17/2011  . Bronchitis 12/17/2011  . Bilateral atelectasis 12/15/2011  . Colitis 12/13/2011  . Leukocytosis  12/13/2011   Past Medical History:  Diagnosis Date  . Anxiety   . Colonic polyp   . Depression   . Hypercholesteremia   . Hypothyroid   . IBS (irritable bowel syndrome)   . Migraines   . Palpitations   . Peripheral neuropathy     Family History  Problem Relation Age of Onset  . Heart attack Sister     Past Surgical History:  Procedure Laterality Date  . CESAREAN SECTION    . TUBAL LIGATION     Social History   Occupational History  . Not on file  Tobacco Use  . Smoking status: Former Games developer  . Smokeless tobacco: Never Used  Vaping Use  . Vaping Use: Never used  Substance and Sexual Activity  . Alcohol use: No  . Drug use: No  . Sexual activity: Not Currently

## 2020-02-22 ENCOUNTER — Ambulatory Visit: Payer: Medicare Other | Admitting: Cardiology

## 2020-02-22 ENCOUNTER — Other Ambulatory Visit: Payer: Self-pay

## 2020-02-22 ENCOUNTER — Encounter: Payer: Self-pay | Admitting: Cardiology

## 2020-02-22 VITALS — BP 120/62 | HR 78 | Ht 62.0 in | Wt 138.0 lb

## 2020-02-22 DIAGNOSIS — I209 Angina pectoris, unspecified: Secondary | ICD-10-CM

## 2020-02-22 DIAGNOSIS — I2510 Atherosclerotic heart disease of native coronary artery without angina pectoris: Secondary | ICD-10-CM

## 2020-02-22 DIAGNOSIS — E7800 Pure hypercholesterolemia, unspecified: Secondary | ICD-10-CM | POA: Diagnosis not present

## 2020-02-22 NOTE — Progress Notes (Signed)
Cardiology Office Note:    Date:  02/22/2020   ID:  Toni Rich, DOB 07/09/1945, MRN [id number]  PCP:  Sigmund Hazel, MD  [facility] Cardiologist:  Donato Schultz, MD  [facility] Electrophysiologist:  None   Referring MD: Sigmund Hazel, MD     History of Present Illness:    Toni Rich is a 74 y.o. female here for the follow-up of hyperlipidemia, coronary atherosclerosis with coronary calcium score of 230 which was 76 percentile on CT scan 08/18/2019 with no flow-limiting coronary disease seen on coronary CT, FFR analysis normal.  If symptoms persisted recommended cardiac catheterization.  Husband died in 06-22-2019. Agent orange.  Occasional left-sided discomfort. She was taking care of one of the kids once kicked her in the chest when she was changing his diaper. Seems not to be exertional.  Overall doing really well without any recent anginal symptoms. Denies any bruising or bleeding with aspirin. No headaches with Imdur. Had a little bit of confusion on whether or not she was taking 20 mg or 40 mg of Crestor. I reminded her that we did increase the Crestor to 40 mg to get her at goal less than 70 on her LDL.  Past Medical History:  Diagnosis Date  . Anxiety   . Colonic polyp   . Depression   . Hypercholesteremia   . Hypothyroid   . IBS (irritable bowel syndrome)   . Migraines   . Palpitations   . Peripheral neuropathy     Past Surgical History:  Procedure Laterality Date  . CESAREAN SECTION    . TUBAL LIGATION      Current Medications: Current Meds  Medication Sig  . aspirin EC 81 MG tablet Take 1 tablet (81 mg total) by mouth daily.  Marland Kitchen atenolol (TENORMIN) 50 MG tablet Take 50 mg by mouth daily.  . Calcium Carbonate-Vitamin D (CALCIUM 600 + D PO) Take 1 tablet by mouth daily.  . cholecalciferol (VITAMIN D) 1000 UNITS tablet Take 2,000 Units by mouth daily.  . citalopram (CELEXA) 20 MG tablet Take 40 mg by mouth daily.  Marland Kitchen dicyclomine (BENTYL) 20 MG  tablet Take 20 mg by mouth as needed. For acid reflux.  . fluticasone (FLONASE) 50 MCG/ACT nasal spray Place 2 sprays into both nostrils daily.  Marland Kitchen HYDROcodone-acetaminophen (NORCO) 5-325 MG tablet Take 1-2 tablets by mouth daily as needed.  . isosorbide mononitrate (IMDUR) 30 MG 24 hr tablet TAKE 1 TABLET BY MOUTH EVERY DAY  . lansoprazole (PREVACID) 30 MG capsule Take 30 mg by mouth daily at 12 noon.  Marland Kitchen levothyroxine (SYNTHROID, LEVOTHROID) 75 MCG tablet Take 75 mcg by mouth daily.  Marland Kitchen LORazepam (ATIVAN) 1 MG tablet Take 1 mg by mouth every 8 (eight) hours.  . Multiple Vitamins-Minerals (MULTIVITAMIN PO) Take 1 tablet by mouth daily.  Marland Kitchen oxyCODONE-acetaminophen (PERCOCET) 5-325 MG tablet Take 1-2 tablets by mouth every 8 (eight) hours as needed for severe pain.  . rosuvastatin (CRESTOR) 40 MG tablet Take 1 tablet (40 mg total) by mouth daily.  . traMADol (ULTRAM) 50 MG tablet Take 1 tablet (50 mg total) by mouth 2 (two) times daily as needed.  . traZODone (DESYREL) 50 MG tablet Take 25 mg by mouth at bedtime as needed for sleep.      Allergies:   Sulfa antibiotics   Social History   Socioeconomic History  . Marital status: Married    Spouse name: Not on file  . Number of children: Not on file  .  Years of education: Not on file  . Highest education level: Not on file  Occupational History  . Not on file  Tobacco Use  . Smoking status: Former Games developer  . Smokeless tobacco: Never Used  Vaping Use  . Vaping Use: Never used  Substance and Sexual Activity  . Alcohol use: No  . Drug use: No  . Sexual activity: Not Currently  Other Topics Concern  . Not on file  Social History Narrative  . Not on file   Social Determinants of Health   Financial Resource Strain:   . Difficulty of Paying Living Expenses: Not on file  Food Insecurity:   . Worried About Programme researcher, broadcasting/film/video in the Last Year: Not on file  . Ran Out of Food in the Last Year: Not on file  Transportation Needs:   . Lack  of Transportation (Medical): Not on file  . Lack of Transportation (Non-Medical): Not on file  Physical Activity:   . Days of Exercise per Week: Not on file  . Minutes of Exercise per Session: Not on file  Stress:   . Feeling of Stress : Not on file  Social Connections:   . Frequency of Communication with Friends and Family: Not on file  . Frequency of Social Gatherings with Friends and Family: Not on file  . Attends Religious Services: Not on file  . Active Member of Clubs or Organizations: Not on file  . Attends Banker Meetings: Not on file  . Marital Status: Not on file     Family History: The patient's family history includes Heart attack in her sister.  ROS:   Please see the history of present illness.    No bleeding no syncope no chest pain no shortness of breath all other systems reviewed and are negative.  EKGs/Labs/Other Studies Reviewed:     Recent Labs: 08/04/2019: BUN 8; Creatinine, Ser 0.91; Potassium 4.0; Sodium 140  Recent Lipid Panel No results found for: CHOL, TRIG, HDL, CHOLHDL, VLDL, LDLCALC, LDLDIRECT   Risk Assessment/Calculations:       Physical Exam:    VS:  BP 120/62   Pulse 78   Ht 5\' 2"  (1.575 m)   Wt 138 lb (62.6 kg)   SpO2 98%   BMI 25.24 kg/m     Wt Readings from Last 3 Encounters:  02/22/20 138 lb (62.6 kg)  08/26/19 131 lb (59.4 kg)  07/27/19 132 lb 6.4 oz (60.1 kg)     GEN:  Well nourished, well developed in no acute distress HEENT: Normal NECK: No JVD; No carotid bruits LYMPHATICS: No lymphadenopathy CARDIAC: RRR, no murmurs, rubs, gallops RESPIRATORY:  Clear to auscultation without rales, wheezing or rhonchi  ABDOMEN: Soft, non-tender, non-distended MUSCULOSKELETAL:  No edema; No deformity  SKIN: Warm and dry NEUROLOGIC:  Alert and oriented x 3 PSYCHIATRIC:  Normal affect   ASSESSMENT:    1. Coronary artery disease involving native coronary artery of native heart without angina pectoris   2. Angina  pectoris (HCC)   3. Pure hypercholesterolemia    PLAN:    In order of problems listed above:  Coronary artery disease moderate nonflow limiting -Borderline flow limitation in the mid LAD region on FFR analysis. Intensified prevention therapy with Crestor 40 mg. -LDL from outside lab 58 on 11/09/2019. Excellent. ALT was 9. Creatinine is normal at 0.85.  Hyperlipidemia -Continue with high intensity statin therapy as above.  Angina -On isosorbide 30 mg a day. Doing well. Continue with atenolol as  well 50 mg. Blood pressure under excellent control. She is also on 81 mg of aspirin as well.      Shared Decision Making/Informed Consent      Medication Adjustments/Labs and Tests Ordered: Current medicines are reviewed at length with the patient today.  Concerns regarding medicines are outlined above.  No orders of the defined types were placed in this encounter.  No orders of the defined types were placed in this encounter.   Patient Instructions  Medication Instructions:  Your physician recommends that you continue on your current medications as directed. Please refer to the Current Medication list given to you today.  *If you need a refill on your cardiac medications before your next appointment, please call your pharmacy*   Lab Work: None If you have labs (blood work) drawn today and your tests are completely normal, you will receive your results only by: Marland Kitchen MyChart Message (if you have MyChart) OR . A paper copy in the mail If you have any lab test that is abnormal or we need to change your treatment, we will call you to review the results.   Testing/Procedures: None   Follow-Up: At [facility] you and your health needs are our priority.  As part of our continuing mission to provide you with exceptional heart care, we have created designated Provider Care Teams.  These Care Teams include your primary Cardiologist (physician) and Advanced Practice Providers (APPs -   Physician Assistants and Nurse Practitioners) who all work together to provide you with the care you need, when you need it.  We recommend signing up for the patient portal called "MyChart".  Sign up information is provided on this After Visit Summary.  MyChart is used to connect with patients for Virtual Visits (Telemedicine).  Patients are able to view lab/test results, encounter notes, upcoming appointments, etc.  Non-urgent messages can be sent to your provider as well.   To learn more about what you can do with MyChart, go to [email or web address].    Your next appointment:   1 year(s)  The format for your next appointment:   In Person  Provider:   You may see Donato Schultz, MD or one of the following Advanced Practice Providers on your designated Care Team:    Norma Fredrickson, NP  Nada Boozer, NP  [name], NP    Other Instructions      Signed, Donato Schultz, MD  02/22/2020 9:37 AM    [facility]

## 2020-02-22 NOTE — Patient Instructions (Signed)
Medication Instructions:  Your physician recommends that you continue on your current medications as directed. Please refer to the Current Medication list given to you today.  *If you need a refill on your cardiac medications before your next appointment, please call your pharmacy*   Lab Work: None If you have labs (blood work) drawn today and your tests are completely normal, you will receive your results only by: . MyChart Message (if you have MyChart) OR . A paper copy in the mail If you have any lab test that is abnormal or we need to change your treatment, we will call you to review the results.   Testing/Procedures: None   Follow-Up: At [facility], you and your health needs are our priority.  As part of our continuing mission to provide you with exceptional heart care, we have created designated Provider Care Teams.  These Care Teams include your primary Cardiologist (physician) and Advanced Practice Providers (APPs -  Physician Assistants and Nurse Practitioners) who all work together to provide you with the care you need, when you need it.  We recommend signing up for the patient portal called "MyChart".  Sign up information is provided on this After Visit Summary.  MyChart is used to connect with patients for Virtual Visits (Telemedicine).  Patients are able to view lab/test results, encounter notes, upcoming appointments, etc.  Non-urgent messages can be sent to your provider as well.   To learn more about what you can do with MyChart, go to [email or web address].    Your next appointment:   1 year(s)  The format for your next appointment:   In Person  Provider:   You may see Mark Skains, MD or one of the following Advanced Practice Providers on your designated Care Team:    Lori Gerhardt, NP  Laura Ingold, NP  Jill McDaniel, NP    Other Instructions   

## 2020-03-01 ENCOUNTER — Other Ambulatory Visit: Payer: Self-pay

## 2020-03-01 ENCOUNTER — Encounter: Payer: Self-pay | Admitting: Orthopaedic Surgery

## 2020-03-01 ENCOUNTER — Ambulatory Visit (INDEPENDENT_AMBULATORY_CARE_PROVIDER_SITE_OTHER): Payer: Medicare Other | Admitting: Orthopaedic Surgery

## 2020-03-01 DIAGNOSIS — M1812 Unilateral primary osteoarthritis of first carpometacarpal joint, left hand: Secondary | ICD-10-CM | POA: Diagnosis not present

## 2020-03-01 NOTE — Progress Notes (Signed)
    Patient: Toni Rich           Date of Birth: 1945-08-14           MRN: [id number] Visit Date: 03/01/2020 PCP: Sigmund Hazel, MD   Assessment & Plan:  Chief Complaint:  Chief Complaint  Patient presents with  . Left Hand - Pain   Visit Diagnoses:  1. Primary osteoarthritis of first carpometacarpal joint of left hand     Plan:  Coley is approximately 4 months status post left thumb [facility] arthroplasty on 11/10/2019.  She states that overall she is doing a lot better with some occasional discomfort.  She has resumed all normal activities and has been back to work doing her normal job without any problems.  Surgical scars are all fully healed.  She has excellent range of motion of her left thumb.  No swelling.  No scar tenderness.  From my standpoint she has done very well from the surgery and has resumed her normal activities.  At this point we will see her back as needed.  Questions encouraged and answered.  Follow-Up Instructions: Return if symptoms worsen or fail to improve.   Orders:  No orders of the defined types were placed in this encounter.  No orders of the defined types were placed in this encounter.   Imaging: No results found.  PMFS History: Patient Active Problem List   Diagnosis Date Noted  . Right thigh pain 03/25/2019  . Right knee pain 10/28/2018  . Primary osteoarthritis of first carpometacarpal joint of left hand 10/28/2018  . Hypokalemia 12/17/2011  . Bronchitis 12/17/2011  . Bilateral atelectasis 12/15/2011  . Colitis 12/13/2011  . Leukocytosis 12/13/2011   Past Medical History:  Diagnosis Date  . Anxiety   . Colonic polyp   . Depression   . Hypercholesteremia   . Hypothyroid   . IBS (irritable bowel syndrome)   . Migraines   . Palpitations   . Peripheral neuropathy     Family History  Problem Relation Age of Onset  . Heart attack Sister     Past Surgical History:  Procedure Laterality Date  . CESAREAN SECTION    . TUBAL  LIGATION     Social History   Occupational History  . Not on file  Tobacco Use  . Smoking status: Former Games developer  . Smokeless tobacco: Never Used  Vaping Use  . Vaping Use: Never used  Substance and Sexual Activity  . Alcohol use: No  . Drug use: No  . Sexual activity: Not Currently

## 2020-03-03 ENCOUNTER — Other Ambulatory Visit: Payer: Self-pay | Admitting: Cardiology

## 2020-03-05 NOTE — Telephone Encounter (Signed)
rx refill

## 2020-03-23 ENCOUNTER — Ambulatory Visit: Payer: Medicare Other | Admitting: Orthopaedic Surgery

## 2020-05-06 DIAGNOSIS — J069 Acute upper respiratory infection, unspecified: Secondary | ICD-10-CM | POA: Diagnosis not present

## 2020-05-06 DIAGNOSIS — U071 COVID-19: Secondary | ICD-10-CM | POA: Diagnosis not present

## 2020-05-14 DIAGNOSIS — E7800 Pure hypercholesterolemia, unspecified: Secondary | ICD-10-CM | POA: Diagnosis not present

## 2020-05-14 DIAGNOSIS — I209 Angina pectoris, unspecified: Secondary | ICD-10-CM | POA: Diagnosis not present

## 2020-05-14 DIAGNOSIS — G43009 Migraine without aura, not intractable, without status migrainosus: Secondary | ICD-10-CM | POA: Diagnosis not present

## 2020-05-14 DIAGNOSIS — I2510 Atherosclerotic heart disease of native coronary artery without angina pectoris: Secondary | ICD-10-CM | POA: Diagnosis not present

## 2020-05-14 DIAGNOSIS — E039 Hypothyroidism, unspecified: Secondary | ICD-10-CM | POA: Diagnosis not present

## 2020-05-14 DIAGNOSIS — K219 Gastro-esophageal reflux disease without esophagitis: Secondary | ICD-10-CM | POA: Diagnosis not present

## 2020-06-15 IMAGING — US US ABDOMEN LIMITED
1 series · 14 of 25 positions shown · non-contrast
Comparison: CT abdomen and pelvis December 13, 2011

CLINICAL DATA: Abdominal pain

EXAM:
ULTRASOUND ABDOMEN LIMITED RIGHT UPPER QUADRANT

[Series 1: us abdomen limited · 0.19mm/px · 14 of 53 slices shown]
[im 1/53]
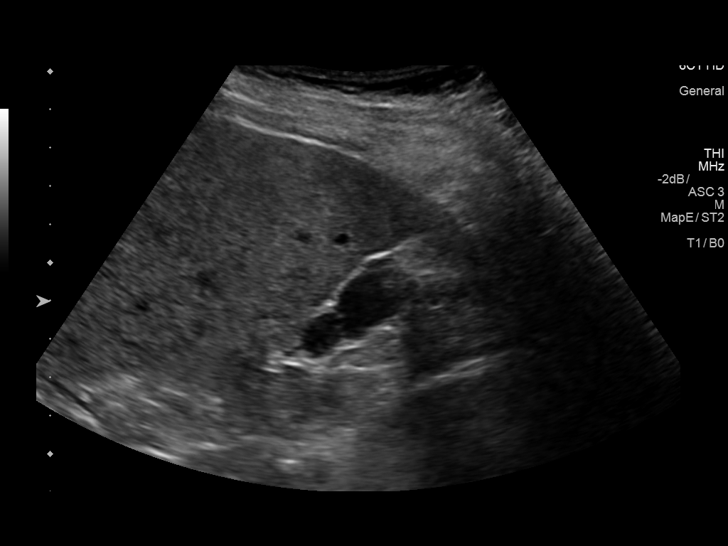
[im 5/53]
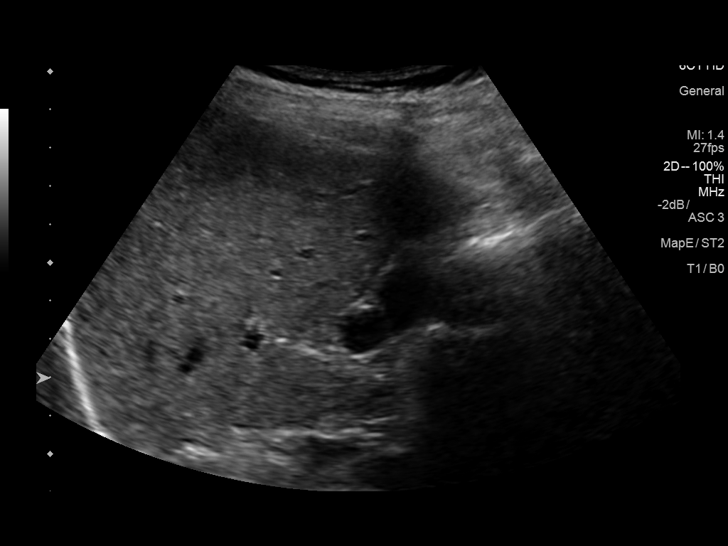
[im 9/53]
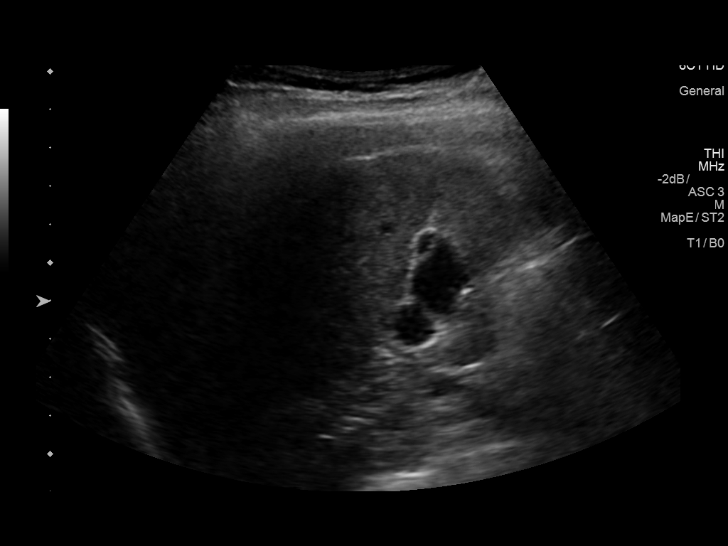
[im 14/53]
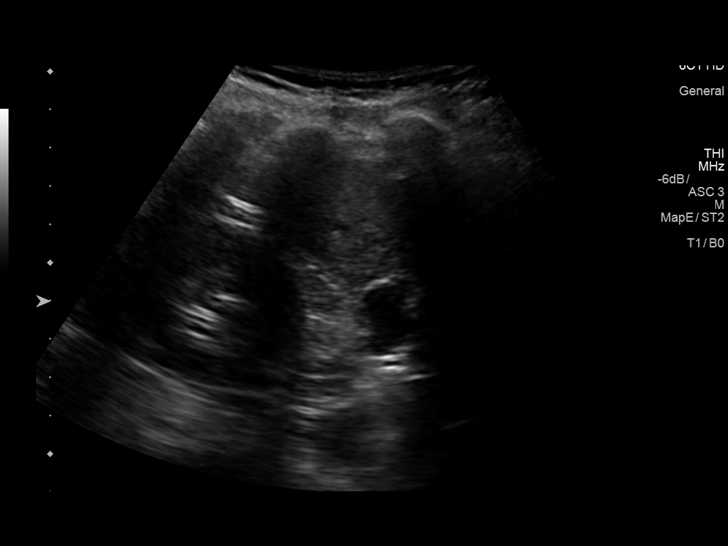
[im 18/53]
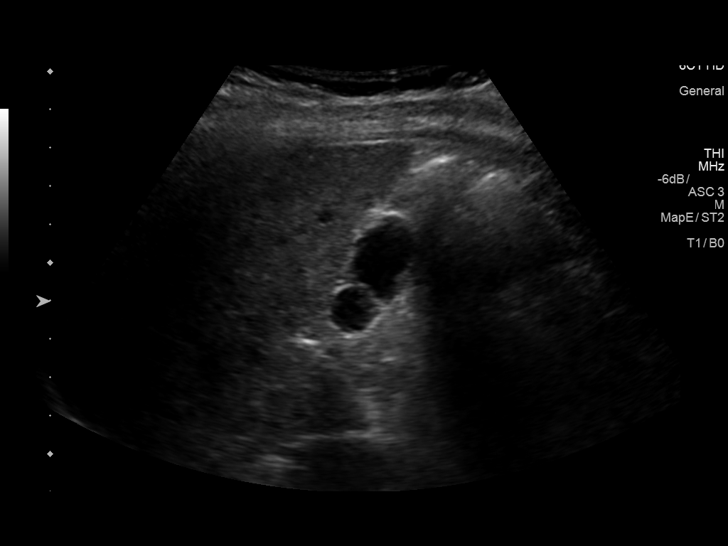
[im 20/53]
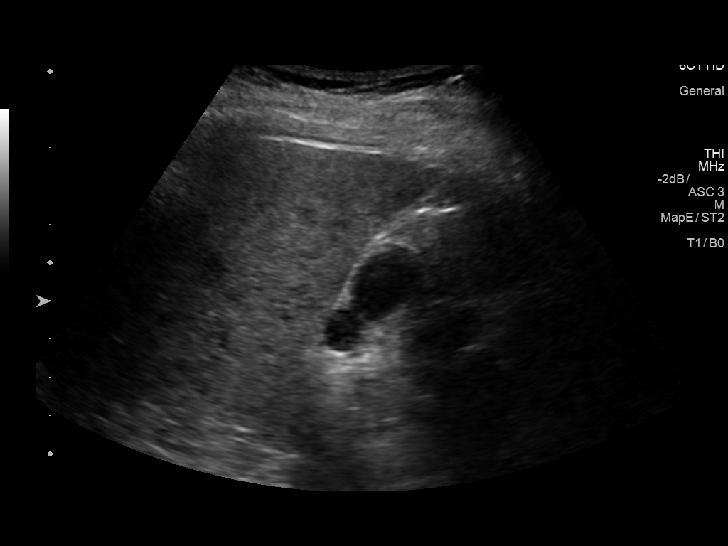
[im 24/53]
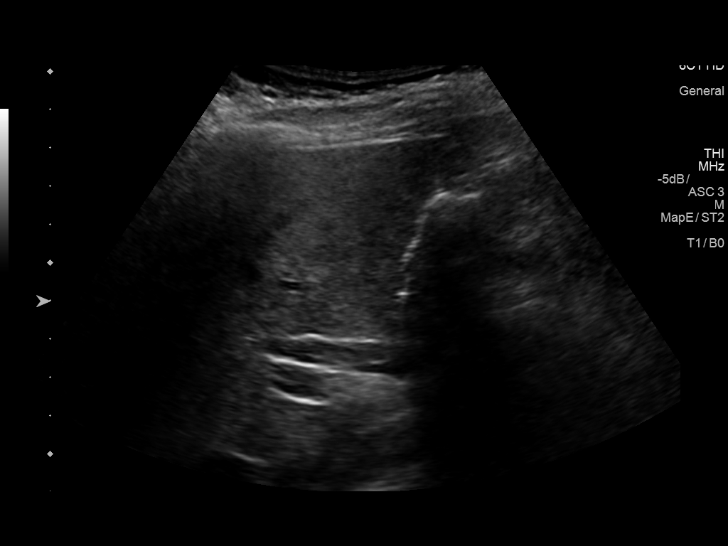
[im 29/53]
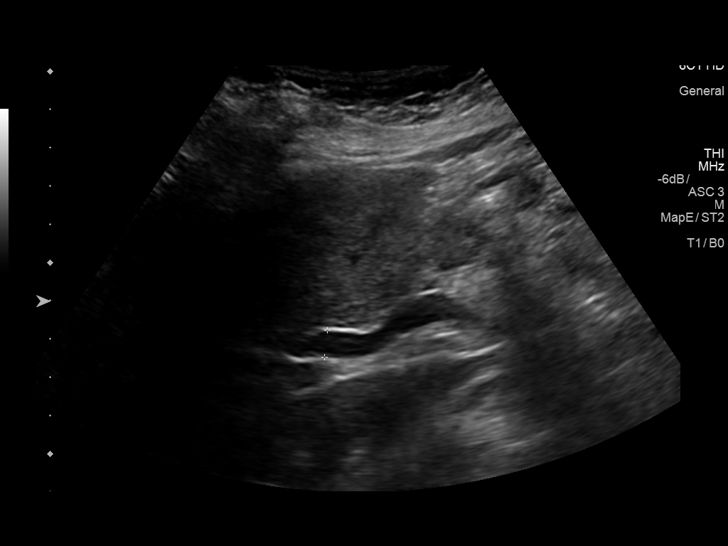
[im 33/53]
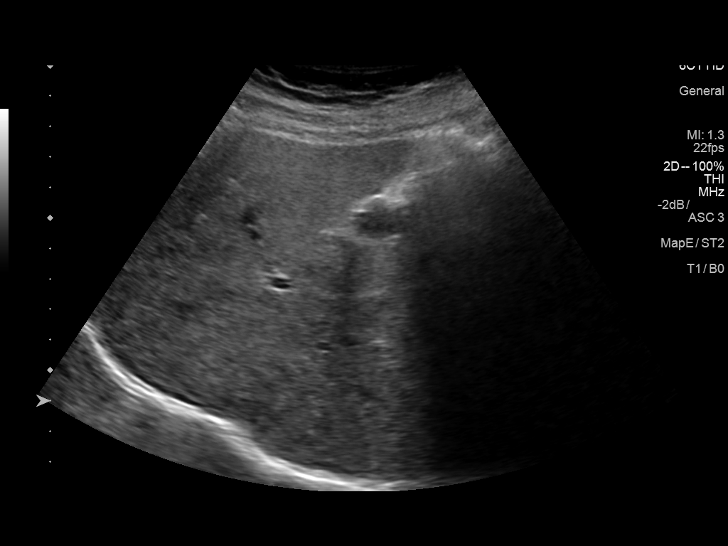
[im 35/53]
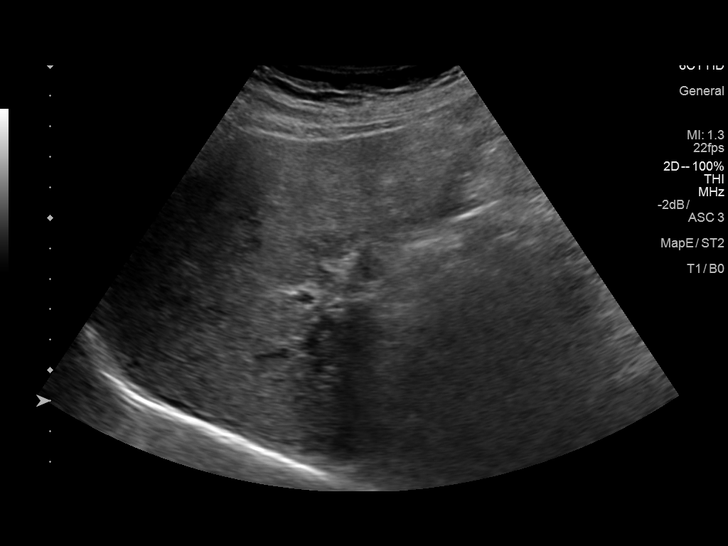
[im 40/53]
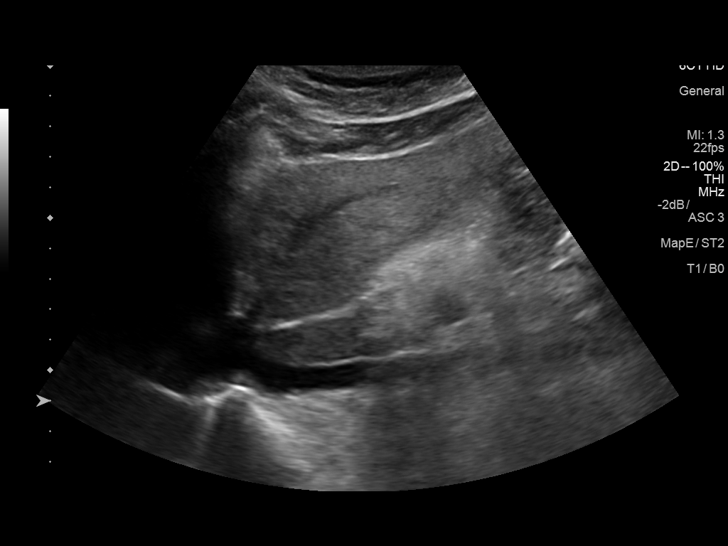
[im 44/53]
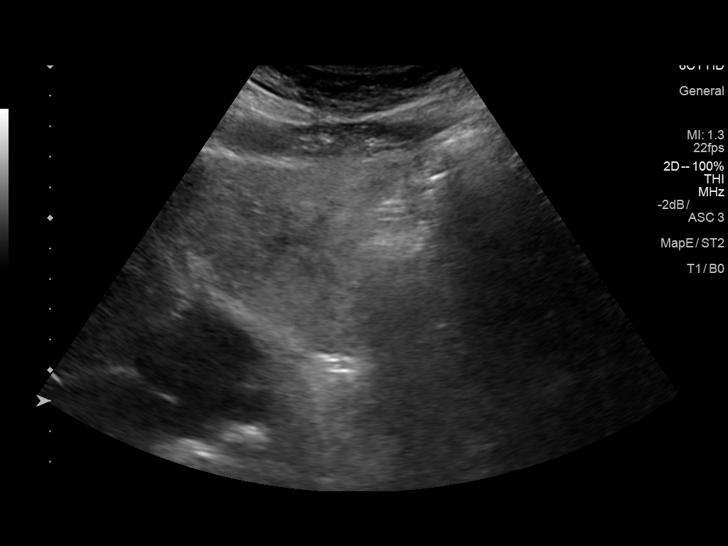
[im 48/53]
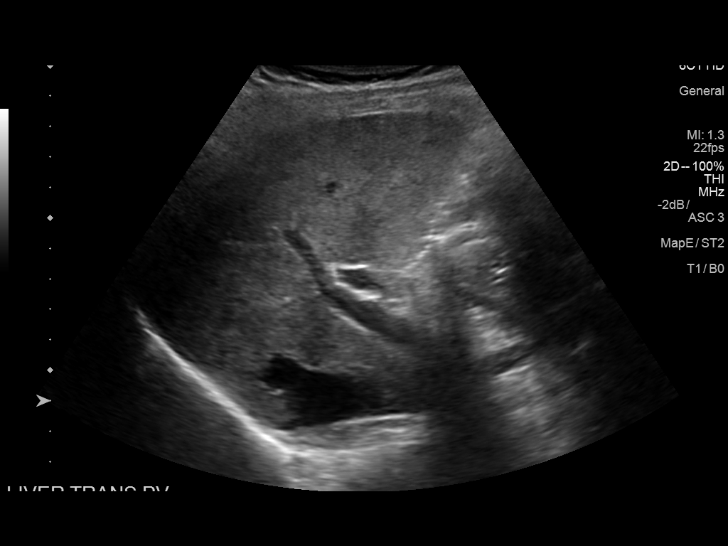
[im 53/53]
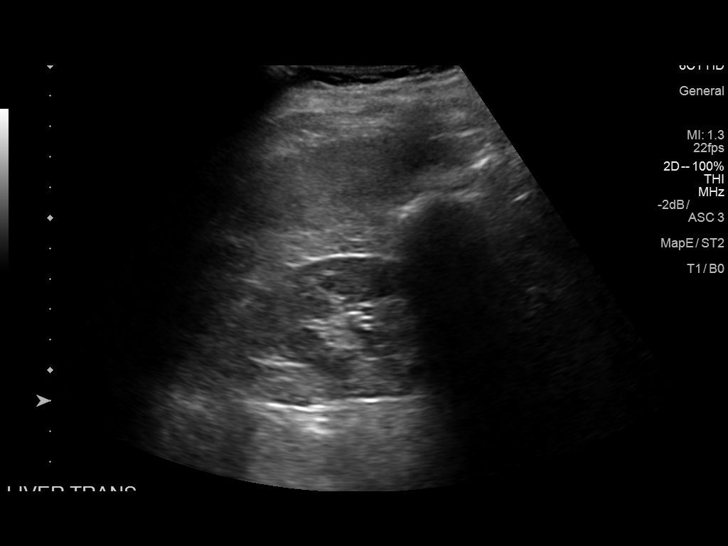

[14 of 25 positions shown; findings below may reference images not displayed]

FINDINGS: Gallbladder:

No gallstones or wall thickening visualized. There are folds within
the gallbladder. No pericholecystic fluid. No sonographic Murphy
sign noted by sonographer.

Common bile duct:

Diameter: 7 mm, upper normal. No intrahepatic biliary duct
dilatation. No biliary duct mass or calculus appreciable.

Liver:

No focal lesion identified. Liver echogenicity is overall
increased.. Portal vein is patent on color Doppler imaging with
normal direction of blood flow towards the liver.
IMPRESSION: Increase in liver echogenicity, a finding felt to be indicative of a
degree of hepatic steatosis. No focal liver lesions are demonstrated
on this study.

Study otherwise unremarkable. Note that there are folds in the
gallbladder but noted demonstrable gallstones or wall thickening.

## 2020-06-27 ENCOUNTER — Other Ambulatory Visit: Payer: Self-pay

## 2020-06-27 DIAGNOSIS — Z8249 Family history of ischemic heart disease and other diseases of the circulatory system: Secondary | ICD-10-CM

## 2020-06-27 DIAGNOSIS — E7800 Pure hypercholesterolemia, unspecified: Secondary | ICD-10-CM

## 2020-06-27 MED ORDER — ROSUVASTATIN CALCIUM 40 MG PO TABS: 40.0000 mg | ORAL_TABLET | Freq: Every day | ORAL | 2 refills | Status: DC

## 2020-07-24 DIAGNOSIS — I700 Atherosclerosis of aorta: Secondary | ICD-10-CM | POA: Diagnosis not present

## 2020-07-24 DIAGNOSIS — F5102 Adjustment insomnia: Secondary | ICD-10-CM | POA: Diagnosis not present

## 2020-07-24 DIAGNOSIS — Z6824 Body mass index (BMI) 24.0-24.9, adult: Secondary | ICD-10-CM | POA: Diagnosis not present

## 2020-07-24 DIAGNOSIS — E7800 Pure hypercholesterolemia, unspecified: Secondary | ICD-10-CM | POA: Diagnosis not present

## 2020-07-24 DIAGNOSIS — E039 Hypothyroidism, unspecified: Secondary | ICD-10-CM | POA: Diagnosis not present

## 2020-07-31 DIAGNOSIS — Z0000 Encounter for general adult medical examination without abnormal findings: Secondary | ICD-10-CM | POA: Diagnosis not present

## 2020-08-23 DIAGNOSIS — I209 Angina pectoris, unspecified: Secondary | ICD-10-CM | POA: Diagnosis not present

## 2020-08-23 DIAGNOSIS — G43009 Migraine without aura, not intractable, without status migrainosus: Secondary | ICD-10-CM | POA: Diagnosis not present

## 2020-08-23 DIAGNOSIS — E7800 Pure hypercholesterolemia, unspecified: Secondary | ICD-10-CM | POA: Diagnosis not present

## 2020-08-23 DIAGNOSIS — E039 Hypothyroidism, unspecified: Secondary | ICD-10-CM | POA: Diagnosis not present

## 2020-08-23 DIAGNOSIS — K219 Gastro-esophageal reflux disease without esophagitis: Secondary | ICD-10-CM | POA: Diagnosis not present

## 2020-08-23 DIAGNOSIS — I2510 Atherosclerotic heart disease of native coronary artery without angina pectoris: Secondary | ICD-10-CM | POA: Diagnosis not present

## 2020-09-08 IMAGING — NM NM HEPATO W/GB/PHARM/[PERSON_NAME]
2 series · 12 of 12 positions shown · non-contrast
Comparison: None.

CLINICAL DATA: Pain and nausea

EXAM:
NUCLEAR MEDICINE HEPATOBILIARY IMAGING WITH GALLBLADDER EF
VIEWS:
Anterior right upper quadrant
RADIOPHARMACEUTICALS:  5.0 mCi [id number]  Choletec IV

[he hepatobiliary · 4.52mm/px · 6 of 60 frames shown (1 of 2)]
[frame 6/60]
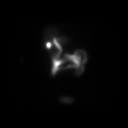
[frame 16/60]
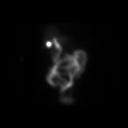
[frame 26/60]
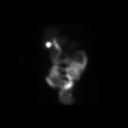
[frame 36/60]
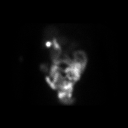
[frame 46/60]
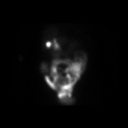
[frame 56/60]
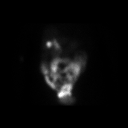

[he hepatobiliary · 4.52mm/px · 6 of 60 frames shown (2 of 2)]
[frame 6/60]
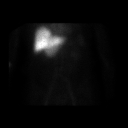
[frame 16/60]
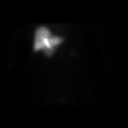
[frame 26/60]
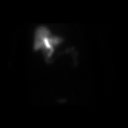
[frame 36/60]
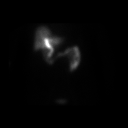
[frame 46/60]
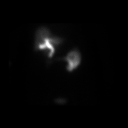
[frame 56/60]
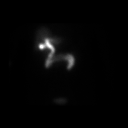

[12 of 12 positions shown; findings below may reference images not displayed]

FINDINGS: Liver uptake of radiotracer is unremarkable. There is prompt
visualization of gallbladder and small bowel, indicating patency of
the cystic and common bile ducts. The patient consumed 8 ounces of
Ensure Complete orally with calculation of the computer generated
ejection fraction of radiotracer from the gallbladder. The patient
did not experience clinical symptoms with the oral Ensure
consumption. The computer generated ejection fraction of radiotracer
from the gallbladder is normal at 66%, normal greater than 33% using
the oral agent.
IMPRESSION: Study within normal limits.

## 2020-09-28 ENCOUNTER — Other Ambulatory Visit: Payer: Self-pay | Admitting: Cardiology

## 2020-09-28 NOTE — Telephone Encounter (Signed)
Rx(s) sent to pharmacy electronically.  

## 2020-11-15 DIAGNOSIS — E7800 Pure hypercholesterolemia, unspecified: Secondary | ICD-10-CM | POA: Diagnosis not present

## 2020-11-15 DIAGNOSIS — I2510 Atherosclerotic heart disease of native coronary artery without angina pectoris: Secondary | ICD-10-CM | POA: Diagnosis not present

## 2020-11-15 DIAGNOSIS — I209 Angina pectoris, unspecified: Secondary | ICD-10-CM | POA: Diagnosis not present

## 2020-11-15 DIAGNOSIS — E039 Hypothyroidism, unspecified: Secondary | ICD-10-CM | POA: Diagnosis not present

## 2020-11-15 DIAGNOSIS — K219 Gastro-esophageal reflux disease without esophagitis: Secondary | ICD-10-CM | POA: Diagnosis not present

## 2020-11-15 DIAGNOSIS — G43009 Migraine without aura, not intractable, without status migrainosus: Secondary | ICD-10-CM | POA: Diagnosis not present

## 2020-12-06 ENCOUNTER — Encounter (INDEPENDENT_AMBULATORY_CARE_PROVIDER_SITE_OTHER): Payer: Medicare Other | Admitting: Ophthalmology

## 2020-12-27 ENCOUNTER — Ambulatory Visit: Payer: Medicare Other | Admitting: Orthopaedic Surgery

## 2021-01-03 ENCOUNTER — Ambulatory Visit: Payer: Self-pay

## 2021-01-03 ENCOUNTER — Ambulatory Visit: Payer: Medicare Other | Admitting: Orthopaedic Surgery

## 2021-01-03 ENCOUNTER — Other Ambulatory Visit: Payer: Self-pay | Admitting: Cardiology

## 2021-01-03 ENCOUNTER — Other Ambulatory Visit: Payer: Self-pay

## 2021-01-03 ENCOUNTER — Encounter: Payer: Self-pay | Admitting: Orthopaedic Surgery

## 2021-01-03 ENCOUNTER — Other Ambulatory Visit: Payer: Self-pay | Admitting: Orthopaedic Surgery

## 2021-01-03 ENCOUNTER — Encounter (INDEPENDENT_AMBULATORY_CARE_PROVIDER_SITE_OTHER): Payer: Medicare Other | Admitting: Ophthalmology

## 2021-01-03 VITALS — Ht 62.0 in | Wt 138.0 lb

## 2021-01-03 DIAGNOSIS — G8929 Other chronic pain: Secondary | ICD-10-CM

## 2021-01-03 DIAGNOSIS — Z1231 Encounter for screening mammogram for malignant neoplasm of breast: Secondary | ICD-10-CM | POA: Diagnosis not present

## 2021-01-03 DIAGNOSIS — M1711 Unilateral primary osteoarthritis, right knee: Secondary | ICD-10-CM

## 2021-01-03 DIAGNOSIS — H353132 Nonexudative age-related macular degeneration, bilateral, intermediate dry stage: Secondary | ICD-10-CM | POA: Diagnosis not present

## 2021-01-03 DIAGNOSIS — M65311 Trigger thumb, right thumb: Secondary | ICD-10-CM | POA: Diagnosis not present

## 2021-01-03 DIAGNOSIS — H43813 Vitreous degeneration, bilateral: Secondary | ICD-10-CM | POA: Diagnosis not present

## 2021-01-03 DIAGNOSIS — Z6824 Body mass index (BMI) 24.0-24.9, adult: Secondary | ICD-10-CM | POA: Diagnosis not present

## 2021-01-03 DIAGNOSIS — M79644 Pain in right finger(s): Secondary | ICD-10-CM

## 2021-01-03 MED ORDER — METHYLPREDNISOLONE ACETATE 40 MG/ML IJ SUSP
13.3300 mg | INTRAMUSCULAR | Status: AC | PRN
  Administered 2021-01-03: 13.33 mg

## 2021-01-03 MED ORDER — BUPIVACAINE HCL 0.25 % IJ SOLN
0.3300 mL | INTRAMUSCULAR | Status: AC | PRN
  Administered 2021-01-03: .33 mL

## 2021-01-03 MED ORDER — BUPIVACAINE HCL 0.25 % IJ SOLN
2.0000 mL | INTRAMUSCULAR | Status: AC | PRN
  Administered 2021-01-03: 2 mL via INTRA_ARTICULAR

## 2021-01-03 MED ORDER — METHYLPREDNISOLONE ACETATE 40 MG/ML IJ SUSP
40.0000 mg | INTRAMUSCULAR | Status: AC | PRN
  Administered 2021-01-03: 40 mg via INTRA_ARTICULAR

## 2021-01-03 MED ORDER — LIDOCAINE HCL 1 % IJ SOLN
1.0000 mL | INTRAMUSCULAR | Status: AC | PRN
  Administered 2021-01-03: 1 mL

## 2021-01-03 MED ORDER — LIDOCAINE HCL 1 % IJ SOLN
2.0000 mL | INTRAMUSCULAR | Status: AC | PRN
  Administered 2021-01-03: 2 mL

## 2021-01-03 NOTE — Progress Notes (Signed)
Office Visit Note   Patient: Toni Rich           Date of Birth: 09/26/1945           MRN: [id number] Visit Date: 01/03/2021              Requested by: Sigmund Hazel, MD [address] [place],  [place] [address] PCP: Sigmund Hazel, MD   Assessment & Plan: Visit Diagnoses:  1. Primary osteoarthritis of right knee   2. Trigger thumb of right hand     Plan: Impression is right knee arthritis and right trigger thumb.  We have discussed cortisone injections for both for which she would like to proceed.  She will follow-up with [place] as needed.  Follow-Up Instructions: Return if symptoms worsen or fail to improve.   Orders:  Orders Placed This Encounter  Procedures   Large Joint Inj: R knee   Hand/UE Inj: R thumb A1    No orders of the defined types were placed in this encounter.     Procedures: Large Joint Inj: R knee on 01/03/2021 9:30 AM Indications: pain Details: 22 G needle, anterolateral approach Medications: 2 mL lidocaine 1 %; 2 mL bupivacaine 0.25 %; 40 mg methylPREDNISolone acetate 40 MG/ML   Hand/UE Inj: R thumb A1 for trigger finger on 01/03/2021 9:30 AM Indications: pain Details: 25 G needle Medications: 1 mL lidocaine 1 %; 0.33 mL bupivacaine 0.25 %; 13.33 mg methylPREDNISolone acetate 40 MG/ML     Clinical Data: No additional findings.   Subjective: Chief Complaint  Patient presents with   Right Hand - Pain   Right Knee - Pain    HPI patient is a pleasant 75 year old female who comes in today with right thumb and right knee pain.  In regards to her thumb, she has been having pain and triggering for the past 6 to 8 weeks without any improvement of symptoms.  No previous history of trigger finger.  She is not a diabetic.  She is status post left thumb CMC arthroplasty last year and doing well there.  Current symptoms on the right thumb do not feel similar.  In regards to the right knee, she has had intermittent pain for the past several months  without improvement or worsening.  No known injury or change in activity.  The pain is to the medial aspect and is worse with walking as well as going up stairs.  She takes an occasional over-the-counter pain medication which does seem to relieve her symptoms.  No previous injection.    Review of Systems as detailed in HPI.  All others reviewed and are negative.   Objective: Vital Signs: Ht 5\' 2"  (1.575 m)   Wt 138 lb (62.6 kg)   BMI 25.24 kg/m   Physical Exam well-developed well-nourished female no acute distress.  Alert and oriented x3.  Ortho Exam examination of the right thumb reveals moderate tenderness and a palpable nodule at the A1 pulley.  She does have reproducible triggering.  Right knee exam shows range of motion from 0 to 120 degrees.  Medial joint line tenderness.  Ligaments are stable.  She is neurovascular in intact distally.  Specialty Comments:  No specialty comments available.  Imaging: XR Finger Thumb Right  Result Date: 01/03/2021 No acute or structural abnormalities  XR KNEE 3 VIEW RIGHT  Result Date: 01/03/2021 Mild to moderate tricompartmental degenerative changes    PMFS History: Patient Active Problem List   Diagnosis Date Noted   Right  thigh pain 03/25/2019   Right knee pain 10/28/2018   Primary osteoarthritis of first carpometacarpal joint of left hand 10/28/2018   Hypokalemia 12/17/2011   Bronchitis 12/17/2011   Bilateral atelectasis 12/15/2011   Colitis 12/13/2011   Leukocytosis 12/13/2011   Past Medical History:  Diagnosis Date   Anxiety    Colonic polyp    Depression    Hypercholesteremia    Hypothyroid    IBS (irritable bowel syndrome)    Migraines    Palpitations    Peripheral neuropathy     Family History  Problem Relation Age of Onset   Heart attack Sister     Past Surgical History:  Procedure Laterality Date   CESAREAN SECTION     TUBAL LIGATION     Social History   Occupational History   Not on file  Tobacco Use    Smoking status: Former   Smokeless tobacco: Never  Vaping Use   Vaping Use: Never used  Substance and Sexual Activity   Alcohol use: No   Drug use: No   Sexual activity: Not Currently

## 2021-01-21 DIAGNOSIS — D7589 Other specified diseases of blood and blood-forming organs: Secondary | ICD-10-CM | POA: Diagnosis not present

## 2021-01-21 DIAGNOSIS — R002 Palpitations: Secondary | ICD-10-CM | POA: Diagnosis not present

## 2021-01-21 DIAGNOSIS — I700 Atherosclerosis of aorta: Secondary | ICD-10-CM | POA: Diagnosis not present

## 2021-01-21 DIAGNOSIS — F5102 Adjustment insomnia: Secondary | ICD-10-CM | POA: Diagnosis not present

## 2021-01-21 DIAGNOSIS — E039 Hypothyroidism, unspecified: Secondary | ICD-10-CM | POA: Diagnosis not present

## 2021-01-21 DIAGNOSIS — Z23 Encounter for immunization: Secondary | ICD-10-CM | POA: Diagnosis not present

## 2021-02-08 ENCOUNTER — Other Ambulatory Visit: Payer: Self-pay | Admitting: Cardiology

## 2021-02-08 DIAGNOSIS — Z8249 Family history of ischemic heart disease and other diseases of the circulatory system: Secondary | ICD-10-CM

## 2021-02-08 DIAGNOSIS — E7800 Pure hypercholesterolemia, unspecified: Secondary | ICD-10-CM

## 2021-02-14 DIAGNOSIS — E039 Hypothyroidism, unspecified: Secondary | ICD-10-CM | POA: Diagnosis not present

## 2021-02-14 DIAGNOSIS — E7800 Pure hypercholesterolemia, unspecified: Secondary | ICD-10-CM | POA: Diagnosis not present

## 2021-02-14 DIAGNOSIS — I2510 Atherosclerotic heart disease of native coronary artery without angina pectoris: Secondary | ICD-10-CM | POA: Diagnosis not present

## 2021-02-14 DIAGNOSIS — K219 Gastro-esophageal reflux disease without esophagitis: Secondary | ICD-10-CM | POA: Diagnosis not present

## 2021-02-14 DIAGNOSIS — G43009 Migraine without aura, not intractable, without status migrainosus: Secondary | ICD-10-CM | POA: Diagnosis not present

## 2021-02-14 DIAGNOSIS — I209 Angina pectoris, unspecified: Secondary | ICD-10-CM | POA: Diagnosis not present

## 2021-03-16 DIAGNOSIS — J069 Acute upper respiratory infection, unspecified: Secondary | ICD-10-CM | POA: Diagnosis not present

## 2021-03-16 DIAGNOSIS — J40 Bronchitis, not specified as acute or chronic: Secondary | ICD-10-CM | POA: Diagnosis not present

## 2021-03-25 ENCOUNTER — Encounter: Payer: Self-pay | Admitting: Cardiology

## 2021-03-25 ENCOUNTER — Ambulatory Visit: Payer: Medicare Other | Admitting: Cardiology

## 2021-03-25 ENCOUNTER — Other Ambulatory Visit: Payer: Self-pay

## 2021-03-25 DIAGNOSIS — E782 Mixed hyperlipidemia: Secondary | ICD-10-CM | POA: Insufficient documentation

## 2021-03-25 DIAGNOSIS — I25119 Atherosclerotic heart disease of native coronary artery with unspecified angina pectoris: Secondary | ICD-10-CM

## 2021-03-25 NOTE — Assessment & Plan Note (Signed)
Borderline flow-limiting coronary artery disease noted in the mid LAD region on FFR analysis.  Continue with goal-directed medical therapy, intensified hyperlipidemia therapy with Crestor 40.  Previous LDL 58 in 2021, ALT was 9. -Continue with isosorbide 30 mg a day.  Also on atenolol 50 mg a day.  Blood pressure has been excellent.  On aspirin 81 as well.

## 2021-03-25 NOTE — Patient Instructions (Signed)
Medication Instructions:  The current medical regimen is effective;  continue present plan and medications.  *If you need a refill on your cardiac medications before your next appointment, please call your pharmacy*  Follow-Up: At [facility], you and your health needs are our priority.  As part of our continuing mission to provide you with exceptional heart care, we have created designated Provider Care Teams.  These Care Teams include your primary Cardiologist (physician) and Advanced Practice Providers (APPs -  Physician Assistants and Nurse Practitioners) who all work together to provide you with the care you need, when you need it.  We recommend signing up for the patient portal called "MyChart".  Sign up information is provided on this After Visit Summary.  MyChart is used to connect with patients for Virtual Visits (Telemedicine).  Patients are able to view lab/test results, encounter notes, upcoming appointments, etc.  Non-urgent messages can be sent to your provider as well.   To learn more about what you can do with MyChart, go to [email or web address].    Your next appointment:   1 year(s)  The format for your next appointment:   In Person  Provider:   Mark Skains, MD   Thank you for choosing [facility]!!    

## 2021-03-25 NOTE — Progress Notes (Signed)
Cardiology Office Note:    Date:  03/25/2021   ID:  Toni Rich, DOB 08/26/1945, MRN [id number]  PCP:  Sigmund Hazel, MD  [facility] Cardiologist:  Donato Schultz, MD  [facility] Electrophysiologist:  None   Referring MD: Sigmund Hazel, MD     History of Present Illness:    Toni Rich is a 75 y.o. female here for the follow-up of coronary atherosclerosis with anginal symptoms with coronary calcium score of 230, 76 percentile on CT scan in May 2021, FFR analysis showed no evidence of flow limitation.    If symptoms persisted recommended cardiac catheterization.  Overall has been doing quite well.  Husband died in May 28, 2019. Agent orange.  Occasional left-sided discomfort. She was taking care of one of the kids once kicked her in the chest when she was changing his diaper. Seems not to be exertional.  Occasionally will have some palpitations at night.  She does take atenolol.  These are rare.  Continue to monitor.  No significant chest pain fevers chills nausea vomiting syncope bleeding.  Denies any specific headaches with isosorbide.  Crestor, no myalgias.    Past Medical History:  Diagnosis Date   Anxiety    Colonic polyp    Depression    Hypercholesteremia    Hypothyroid    IBS (irritable bowel syndrome)    Migraines    Palpitations    Peripheral neuropathy     Past Surgical History:  Procedure Laterality Date   CESAREAN SECTION     TUBAL LIGATION      Current Medications: Current Meds  Medication Sig   aspirin EC 81 MG tablet Take 1 tablet (81 mg total) by mouth daily.   atenolol (TENORMIN) 50 MG tablet Take 50 mg by mouth daily.   Calcium Carbonate-Vitamin D (CALCIUM 600 + D PO) Take 1 tablet by mouth daily.   cholecalciferol (VITAMIN D) 1000 UNITS tablet Take 2,000 Units by mouth daily.   citalopram (CELEXA) 20 MG tablet Take 40 mg by mouth daily.   dicyclomine (BENTYL) 20 MG tablet Take 20 mg by mouth as needed. For acid reflux.    fluticasone (FLONASE) 50 MCG/ACT nasal spray Place 2 sprays into both nostrils daily.   HYDROcodone-acetaminophen (NORCO) 5-325 MG tablet Take 1-2 tablets by mouth daily as needed.   isosorbide mononitrate (IMDUR) 30 MG 24 hr tablet Take 1 tablet (30 mg total) by mouth daily. Please keep upcoming appt in December 2022 with Dr. Anne Fu before anymore refills. Thank you   lansoprazole (PREVACID) 30 MG capsule Take 30 mg by mouth daily at 12 noon.   levothyroxine (SYNTHROID, LEVOTHROID) 75 MCG tablet Take 75 mcg by mouth daily.   LORazepam (ATIVAN) 1 MG tablet Take 1 mg by mouth every 8 (eight) hours.   Multiple Vitamins-Minerals (MULTIVITAMIN PO) Take 1 tablet by mouth daily.   oxyCODONE-acetaminophen (PERCOCET) 5-325 MG tablet Take 1-2 tablets by mouth every 8 (eight) hours as needed for severe pain.   rosuvastatin (CRESTOR) 40 MG tablet TAKE 1 TABLET BY MOUTH  DAILY   traMADol (ULTRAM) 50 MG tablet Take 1 tablet (50 mg total) by mouth 2 (two) times daily as needed.   traZODone (DESYREL) 50 MG tablet Take 25 mg by mouth at bedtime as needed for sleep.      Allergies:   Sulfa antibiotics   Social History   Socioeconomic History   Marital status: Married    Spouse name: Not on file   Number of children: Not  on file   Years of education: Not on file   Highest education level: Not on file  Occupational History   Not on file  Tobacco Use   Smoking status: Former   Smokeless tobacco: Never  Vaping Use   Vaping Use: Never used  Substance and Sexual Activity   Alcohol use: No   Drug use: No   Sexual activity: Not Currently  Other Topics Concern   Not on file  Social History Narrative   Not on file   Social Determinants of Health   Financial Resource Strain: Not on file  Food Insecurity: Not on file  Transportation Needs: Not on file  Physical Activity: Not on file  Stress: Not on file  Social Connections: Not on file     Family History: The patient's family history includes  Heart attack in her sister.  ROS:   Please see the history of present illness.    No bleeding no syncope no chest pain no shortness of breath all other systems reviewed and are negative.  EKGs/Labs/Other Studies Reviewed:     Recent Labs: No results found for requested labs within last 8760 hours.  Recent Lipid Panel No results found for: CHOL, TRIG, HDL, CHOLHDL, VLDL, LDLCALC, LDLDIRECT  EKG 03/25/2021-sinus rhythm 66 personally reviewed and interpreted.  Physical Exam:    VS:  BP 112/60 (BP Location: Left Arm, Patient Position: Sitting, Cuff Size: Normal)   Pulse 66   Ht 5\' 2"  (1.575 m)   Wt 137 lb (62.1 kg)   BMI 25.06 kg/m     Wt Readings from Last 3 Encounters:  03/25/21 137 lb (62.1 kg)  01/03/21 138 lb (62.6 kg)  02/22/20 138 lb (62.6 kg)    GEN: Well nourished, well developed, in no acute distress HEENT: normal Neck: no JVD, carotid bruits, or masses Cardiac: RRR; no murmurs, rubs, or gallops,no edema  Respiratory:  clear to auscultation bilaterally, normal work of breathing GI: soft, nontender, nondistended, + BS MS: no deformity or atrophy Skin: warm and dry, no rash Neuro:  Alert and Oriented x 3, Strength and sensation are intact Psych: euthymic mood, full affect   ASSESSMENT:    1. Coronary artery disease involving native coronary artery of native heart with angina pectoris (HCC)   2. Mixed hyperlipidemia     PLAN:    In order of problems listed above:  Coronary artery disease involving native coronary artery of native heart with angina pectoris (HCC) Borderline flow-limiting coronary artery disease noted in the mid LAD region on FFR analysis.  Continue with goal-directed medical therapy, intensified hyperlipidemia therapy with Crestor 40.  Previous LDL 58 in 2021, ALT was 9. -Continue with isosorbide 30 mg a day.  Also on atenolol 50 mg a day.  Blood pressure has been excellent.  On aspirin 81 as well.  Mixed hyperlipidemia Continue with statin  therapy.  High intensity.  Crestor 40.  Last LDL 64.  ALT 13.  Hemoglobin 13.8 creatinine 0.8 triglycerides 280.  Watch sugars.       Shared Decision Making/Informed Consent      Medication Adjustments/Labs and Tests Ordered: Current medicines are reviewed at length with the patient today.  Concerns regarding medicines are outlined above.  Orders Placed This Encounter  Procedures   EKG 12-Lead    No orders of the defined types were placed in this encounter.   Patient Instructions  Medication Instructions:  The current medical regimen is effective;  continue present plan and medications.  *If  you need a refill on your cardiac medications before your next appointment, please call your pharmacy*  Follow-Up: At [facility], you and your health needs are our priority.  As part of our continuing mission to provide you with exceptional heart care, we have created designated Provider Care Teams.  These Care Teams include your primary Cardiologist (physician) and Advanced Practice Providers (APPs -  Physician Assistants and Nurse Practitioners) who all work together to provide you with the care you need, when you need it.  We recommend signing up for the patient portal called "MyChart".  Sign up information is provided on this After Visit Summary.  MyChart is used to connect with patients for Virtual Visits (Telemedicine).  Patients are able to view lab/test results, encounter notes, upcoming appointments, etc.  Non-urgent messages can be sent to your provider as well.   To learn more about what you can do with MyChart, go to [email or web address].    Your next appointment:   1 year(s)  The format for your next appointment:   In Person  Provider:   Donato Schultz, MD     Thank you for choosing [facility]!!     Signed, Donato Schultz, MD  03/25/2021 8:29 AM    [facility]

## 2021-03-25 NOTE — Assessment & Plan Note (Addendum)
Continue with statin therapy.  High intensity.  Crestor 40.  Last LDL 64.  ALT 13.  Hemoglobin 13.8 creatinine 0.8 triglycerides 280.  Watch sugars.

## 2021-04-14 DIAGNOSIS — J0100 Acute maxillary sinusitis, unspecified: Secondary | ICD-10-CM | POA: Diagnosis not present

## 2021-04-16 DIAGNOSIS — J0100 Acute maxillary sinusitis, unspecified: Secondary | ICD-10-CM | POA: Diagnosis not present

## 2021-04-17 DIAGNOSIS — E039 Hypothyroidism, unspecified: Secondary | ICD-10-CM | POA: Diagnosis not present

## 2021-04-19 ENCOUNTER — Other Ambulatory Visit: Payer: Self-pay | Admitting: Cardiology

## 2021-05-01 ENCOUNTER — Other Ambulatory Visit: Payer: Self-pay | Admitting: Cardiology

## 2021-05-01 DIAGNOSIS — Z8249 Family history of ischemic heart disease and other diseases of the circulatory system: Secondary | ICD-10-CM

## 2021-05-01 DIAGNOSIS — E7800 Pure hypercholesterolemia, unspecified: Secondary | ICD-10-CM

## 2021-05-07 DIAGNOSIS — J0100 Acute maxillary sinusitis, unspecified: Secondary | ICD-10-CM | POA: Diagnosis not present

## 2021-05-08 DIAGNOSIS — R058 Other specified cough: Secondary | ICD-10-CM | POA: Diagnosis not present

## 2021-05-08 DIAGNOSIS — B349 Viral infection, unspecified: Secondary | ICD-10-CM | POA: Diagnosis not present

## 2021-05-08 DIAGNOSIS — R509 Fever, unspecified: Secondary | ICD-10-CM | POA: Diagnosis not present

## 2021-05-08 DIAGNOSIS — Z03818 Encounter for observation for suspected exposure to other biological agents ruled out: Secondary | ICD-10-CM | POA: Diagnosis not present

## 2021-07-18 DIAGNOSIS — H524 Presbyopia: Secondary | ICD-10-CM | POA: Diagnosis not present

## 2021-07-18 DIAGNOSIS — H353132 Nonexudative age-related macular degeneration, bilateral, intermediate dry stage: Secondary | ICD-10-CM | POA: Diagnosis not present

## 2021-07-18 DIAGNOSIS — H31013 Macula scars of posterior pole (postinflammatory) (post-traumatic), bilateral: Secondary | ICD-10-CM | POA: Diagnosis not present

## 2021-07-18 DIAGNOSIS — H02403 Unspecified ptosis of bilateral eyelids: Secondary | ICD-10-CM | POA: Diagnosis not present

## 2021-07-24 ENCOUNTER — Telehealth: Payer: Self-pay

## 2021-07-24 ENCOUNTER — Ambulatory Visit: Payer: Medicare Other | Admitting: Physician Assistant

## 2021-07-24 DIAGNOSIS — M1711 Unilateral primary osteoarthritis, right knee: Secondary | ICD-10-CM | POA: Diagnosis not present

## 2021-07-24 DIAGNOSIS — M65311 Trigger thumb, right thumb: Secondary | ICD-10-CM

## 2021-07-24 MED ORDER — LIDOCAINE HCL 1 % IJ SOLN
1.0000 mL | INTRAMUSCULAR | Status: AC | PRN
  Administered 2021-07-24: 1 mL

## 2021-07-24 MED ORDER — BUPIVACAINE HCL 0.25 % IJ SOLN
0.3300 mL | INTRAMUSCULAR | Status: AC | PRN
  Administered 2021-07-24: .33 mL

## 2021-07-24 MED ORDER — METHYLPREDNISOLONE ACETATE 40 MG/ML IJ SUSP
13.3300 mg | INTRAMUSCULAR | Status: AC | PRN
  Administered 2021-07-24: 13.33 mg

## 2021-07-24 NOTE — Progress Notes (Signed)
? ?Office Visit Note ?  ?Patient: Toni Rich           ?Date of Birth: [birth year]           ?MRN: [id number] ?Visit Date: 07/24/2021 ?             ?Requested by: Kathyrn Lass, MD ?[address] [place] ?[place],  [place] [address] ?PCP: Kathyrn Lass, MD ? ? ?Assessment & Plan: ?Visit Diagnoses:  ?1. Trigger thumb of right hand   ?2. Unilateral primary osteoarthritis, right knee   ? ? ?Plan: Impression is recurrent right thumb trigger finger and right knee osteoarthritis.  In regards to the thumb we have discussed repeat cortisone injection versus A1 pulley release.  She would like to try 1 more cortisone injection before proceeding with surgery.  In regards to the knee, her pain has not gotten to the point where it was when she was seen by [place] in September.  We discussed repeat cortisone injection vs visco injection.  She would like to try visco injection first.  We will call her once approved. ? ?This patient is diagnosed with osteoarthritis of the knee(s).   ? ?Radiographs show evidence of joint space narrowing, osteophytes, subchondral sclerosis and/or subchondral cysts.  This patient has knee pain which interferes with functional and activities of daily living.   ? ?This patient has experienced inadequate response, adverse effects and/or intolerance with conservative treatments such as acetaminophen, NSAIDS, topical creams, physical therapy or regular exercise, knee bracing and/or weight loss.  ? ?This patient has experienced inadequate response or has a contraindication to intra articular steroid injections for at least 3 months.  ? ?This patient is not scheduled to have a total knee replacement within 6 months of starting treatment with viscosupplementation. ? ? ?Follow-Up Instructions: Return if symptoms worsen or fail to improve.  ? ?Orders:  ?Orders Placed This Encounter  ?Procedures  ? Hand/UE Inj: R thumb A1  ? ?No orders of the defined types were placed in this encounter. ? ? ? ? Procedures: ?Hand/UE Inj: R  thumb A1 for trigger finger on 07/24/2021 8:39 AM ?Indications: pain ?Details: 25 G needle ?Medications: 1 mL lidocaine 1 %; 0.33 mL bupivacaine 0.25 %; 13.33 mg methylPREDNISolone acetate 40 MG/ML ? ? ? ? ?Clinical Data: ?No additional findings. ? ? ?Subjective: ?Chief Complaint  ?Patient presents with  ? Right Thumb - Pain  ? Right Knee - Pain  ? ? ?HPI patient is a pleasant 76 year old female who comes in today with recurrent right knee and right thumb pain.  She was seen in our office in September of last year for both right knee osteoarthritis and right trigger thumb.  Injections to both the right knee joint in addition to the right trigger thumb were performed.  She had significant relief following the trigger thumb injection.  Her symptoms have recently returned.  She is getting pain at the A1 pulley in addition to triggering.  In regards to the knee, the injection helped quite a bit.  She has an occasional pain primarily with kneeling.  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  She has never undergone viscosupplementation injection. ? ?Review of Systems as detailed in HPI.  All others reviewed and are negative. ? ? ?Objective: ?Vital Signs: There were no vitals taken for this visit. ? ?Physical Exam well-developed well-nourished female no acute distress.  Alert and oriented x3. ? ?Ortho Exam right thumb exam reveals a  moderately tender and palpable nodule at the A1 pulley.  She does have reproducible triggering.  Right knee exam shows no effusion.  Range of motion 0 to 110 degrees.  Medial joint line tenderness.  Moderate patellofemoral crepitus.  Ligaments are stable.  She is neurovascular intact distally. ? ?Specialty Comments:  ?No specialty comments available. ? ?Imaging: ?No new imaging ? ? ?PMFS History: ?Patient Active Problem List  ? Diagnosis Date Noted  ? Coronary artery disease involving native coronary artery of native heart with angina pectoris ([place])  03/25/2021  ? Mixed hyperlipidemia 03/25/2021  ? Right thigh pain 03/25/2019  ? Right knee pain 10/28/2018  ? Primary osteoarthritis of first carpometacarpal joint of left hand 10/28/2018  ? Hypokalemia 12/17/2011  ? Bronchitis 12/17/2011  ? Bilateral atelectasis 12/15/2011  ? Colitis 12/13/2011  ? Leukocytosis 12/13/2011  ? ?Past Medical History:  ?Diagnosis Date  ? Anxiety   ? Colonic polyp   ? Depression   ? Hypercholesteremia   ? Hypothyroid   ? IBS (irritable bowel syndrome)   ? Migraines   ? Palpitations   ? Peripheral neuropathy   ?  ?Family History  ?Problem Relation Age of Onset  ? Heart attack Sister   ?  ?Past Surgical History:  ?Procedure Laterality Date  ? CESAREAN SECTION    ? TUBAL LIGATION    ? ?Social History  ? ?Occupational History  ? Not on file  ?Tobacco Use  ? Smoking status: Former  ? Smokeless tobacco: Never  ?Vaping Use  ? Vaping Use: Never used  ?Substance and Sexual Activity  ? Alcohol use: No  ? Drug use: No  ? Sexual activity: Not Currently  ? ? ? ? ? ? ?

## 2021-07-24 NOTE — Telephone Encounter (Signed)
Please submit for right knee gel inj 

## 2021-07-25 DIAGNOSIS — K219 Gastro-esophageal reflux disease without esophagitis: Secondary | ICD-10-CM | POA: Diagnosis not present

## 2021-07-25 DIAGNOSIS — E039 Hypothyroidism, unspecified: Secondary | ICD-10-CM | POA: Diagnosis not present

## 2021-07-25 DIAGNOSIS — I700 Atherosclerosis of aorta: Secondary | ICD-10-CM | POA: Diagnosis not present

## 2021-07-30 NOTE — Telephone Encounter (Signed)
Noted  

## 2021-08-05 DIAGNOSIS — Z0000 Encounter for general adult medical examination without abnormal findings: Secondary | ICD-10-CM | POA: Diagnosis not present

## 2021-08-06 ENCOUNTER — Other Ambulatory Visit: Payer: Self-pay | Admitting: Family Medicine

## 2021-08-06 DIAGNOSIS — M8580 Other specified disorders of bone density and structure, unspecified site: Secondary | ICD-10-CM

## 2021-09-16 DIAGNOSIS — J019 Acute sinusitis, unspecified: Secondary | ICD-10-CM | POA: Diagnosis not present

## 2021-09-16 DIAGNOSIS — J40 Bronchitis, not specified as acute or chronic: Secondary | ICD-10-CM | POA: Diagnosis not present

## 2021-09-27 DIAGNOSIS — M85851 Other specified disorders of bone density and structure, right thigh: Secondary | ICD-10-CM | POA: Diagnosis not present

## 2021-09-27 DIAGNOSIS — M85852 Other specified disorders of bone density and structure, left thigh: Secondary | ICD-10-CM | POA: Diagnosis not present

## 2021-09-27 DIAGNOSIS — Z780 Asymptomatic menopausal state: Secondary | ICD-10-CM | POA: Diagnosis not present

## 2021-12-04 DIAGNOSIS — K760 Fatty (change of) liver, not elsewhere classified: Secondary | ICD-10-CM | POA: Diagnosis not present

## 2021-12-04 DIAGNOSIS — K219 Gastro-esophageal reflux disease without esophagitis: Secondary | ICD-10-CM | POA: Diagnosis not present

## 2022-01-03 ENCOUNTER — Encounter (INDEPENDENT_AMBULATORY_CARE_PROVIDER_SITE_OTHER): Payer: Medicare Other | Admitting: Ophthalmology

## 2022-01-23 ENCOUNTER — Ambulatory Visit: Payer: Medicare Other | Admitting: Orthopaedic Surgery

## 2022-01-23 ENCOUNTER — Encounter (INDEPENDENT_AMBULATORY_CARE_PROVIDER_SITE_OTHER): Payer: Medicare Other | Admitting: Ophthalmology

## 2022-01-23 ENCOUNTER — Telehealth: Payer: Self-pay

## 2022-01-23 DIAGNOSIS — H353132 Nonexudative age-related macular degeneration, bilateral, intermediate dry stage: Secondary | ICD-10-CM

## 2022-01-23 DIAGNOSIS — M65311 Trigger thumb, right thumb: Secondary | ICD-10-CM | POA: Diagnosis not present

## 2022-01-23 DIAGNOSIS — H43813 Vitreous degeneration, bilateral: Secondary | ICD-10-CM | POA: Diagnosis not present

## 2022-01-23 DIAGNOSIS — M65312 Trigger thumb, left thumb: Secondary | ICD-10-CM

## 2022-01-23 MED ORDER — METHYLPREDNISOLONE ACETATE 40 MG/ML IJ SUSP
10.0000 mg | INTRAMUSCULAR | Status: AC | PRN
  Administered 2022-01-23: 10 mg

## 2022-01-23 NOTE — Progress Notes (Signed)
Office Visit Note   Patient: Toni Rich           Date of Birth: [birth year]           MRN: [id number] Visit Date: 01/23/2022              Requested by: Sigmund Hazel, MD [address] [place],  [place] [address] PCP: Sigmund Hazel, MD   Assessment & Plan: Visit Diagnoses:  1. Bilateral trigger thumb     PLAN: Impression is bilateral trigger thumb right greater than left.  Today, we discussed various treatment options to include repeat injection vs. surgery.  She would like to proceed with bilateral trigger thumb injections today.    Follow-Up Instructions: Return for once approved for visco inj.   Orders:  No orders of the defined types were placed in this encounter.  No orders of the defined types were placed in this encounter.     Procedures: Hand/UE Inj: bilateral thumb A1 for trigger finger on 01/23/2022 8:53 PM Indications: pain Details: 25 G needle Medications (Right): 10 mg methylPREDNISolone acetate 40 MG/ML Medications (Left): 10 mg methylPREDNISolone acetate 40 MG/ML Outcome: tolerated well, no immediate complications Consent was given by the patient. Patient was prepped and draped in the usual sterile fashion.       Clinical Data: No additional findings.   Subjective: Chief Complaint  Patient presents with   Left Thumb - Pain   Right Thumb - Pain    HPI patient is a pleasant 76 year old female who comes in today with bilateral thumb pain and triggering.  History of right trigger thumb but has previously been injected twice with good relief.  Her last injection was on 07/24/2021.  Her symptoms have returned.  She not only has pain to the A1 pulley but has reproducible triggering.  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  She is also recently started noticing pain and triggering to the left thumb.  No previous injection to the A1 pulley.  Of note, she is status post left  thumb CMC arthroplasty.  Review of Systems as detailed in HPI.  All others reviewed and are negative.   Objective: Vital Signs: There were no vitals taken for this visit.  Physical Exam well-developed well-nourished female no acute distress.  Alert and oriented x3.  Ortho Exam bilateral thumb exam reveals moderate tenderness and a palpable nodule at the A1 pulley.  She does have reproducible triggering.  She is neurovascular intact distally. Specialty Comments:  No specialty comments available.  Imaging: No new imaging   PMFS History: Patient Active Problem List   Diagnosis Date Noted   Coronary artery disease involving native coronary artery of native heart with angina pectoris (HCC) 03/25/2021   Mixed hyperlipidemia 03/25/2021   Right thigh pain 03/25/2019   Right knee pain 10/28/2018   Primary osteoarthritis of first carpometacarpal joint of left hand 10/28/2018   Hypokalemia 12/17/2011   Bronchitis 12/17/2011   Bilateral atelectasis 12/15/2011   Colitis 12/13/2011   Leukocytosis 12/13/2011   Past Medical History:  Diagnosis Date   Anxiety    Colonic polyp    Depression    Hypercholesteremia    Hypothyroid    IBS (irritable bowel syndrome)    Migraines    Palpitations    Peripheral neuropathy     Family History  Problem Relation Age of Onset   Heart attack Sister  Past Surgical History:  Procedure Laterality Date   CESAREAN SECTION     TUBAL LIGATION     Social History   Occupational History   Not on file  Tobacco Use   Smoking status: Former   Smokeless tobacco: Never  Vaping Use   Vaping Use: Never used  Substance and Sexual Activity   Alcohol use: No   Drug use: No   Sexual activity: Not Currently

## 2022-01-23 NOTE — Telephone Encounter (Signed)
VOB submitted for Durolane, right knee.  

## 2022-02-14 ENCOUNTER — Other Ambulatory Visit: Payer: Medicare Other

## 2022-02-14 DIAGNOSIS — Z23 Encounter for immunization: Secondary | ICD-10-CM | POA: Diagnosis not present

## 2022-02-14 DIAGNOSIS — R002 Palpitations: Secondary | ICD-10-CM | POA: Diagnosis not present

## 2022-02-14 DIAGNOSIS — E7800 Pure hypercholesterolemia, unspecified: Secondary | ICD-10-CM | POA: Diagnosis not present

## 2022-02-14 DIAGNOSIS — E039 Hypothyroidism, unspecified: Secondary | ICD-10-CM | POA: Diagnosis not present

## 2022-02-14 DIAGNOSIS — I2510 Atherosclerotic heart disease of native coronary artery without angina pectoris: Secondary | ICD-10-CM | POA: Diagnosis not present

## 2022-02-14 DIAGNOSIS — M8580 Other specified disorders of bone density and structure, unspecified site: Secondary | ICD-10-CM | POA: Diagnosis not present

## 2022-03-21 ENCOUNTER — Other Ambulatory Visit: Payer: Self-pay | Admitting: Cardiology

## 2022-03-24 DIAGNOSIS — G4700 Insomnia, unspecified: Secondary | ICD-10-CM | POA: Diagnosis not present

## 2022-04-11 ENCOUNTER — Other Ambulatory Visit: Payer: Self-pay | Admitting: Cardiology

## 2022-04-15 ENCOUNTER — Other Ambulatory Visit: Payer: Self-pay | Admitting: Cardiology

## 2022-04-15 DIAGNOSIS — E7800 Pure hypercholesterolemia, unspecified: Secondary | ICD-10-CM

## 2022-04-15 DIAGNOSIS — Z8249 Family history of ischemic heart disease and other diseases of the circulatory system: Secondary | ICD-10-CM

## 2022-04-19 DIAGNOSIS — J0100 Acute maxillary sinusitis, unspecified: Secondary | ICD-10-CM | POA: Diagnosis not present

## 2022-05-08 ENCOUNTER — Other Ambulatory Visit: Payer: Self-pay | Admitting: Cardiology

## 2022-05-12 NOTE — Progress Notes (Signed)
 " Cardiology Office Note:    Date:  05/13/2022   ID:  Toni Rich, DOB [birth year], MRN [id number]  PCP:  Cleotilde Planas, MD   [facility] Providers Cardiologist:  Oneil Parchment, MD     Referring MD: Cleotilde Planas, MD   Chief Complaint  Patient presents with   Follow-up    Seen for Dr. Parchment    History of Present Illness:    Toni Rich is a 77 y.o. female with a hx of CAD (coronary calcium  score of 230), palpitations (atenolol ), hypothyroidism, HLD, depression, anxiety, GERD.  Initially established care with our office in 2013, then was seen again in 2021 for atypical chest pain. Felt to be atypical, however strong family history of CAD so  coronary CT with FFR was ordered which showed a calcium  score of 239, three vessel mixed calcified plaque that appeared non-flow limiting, FFR showed borderline flow limiting in mid LAD and prevention therapy was intensified. .   Was last seen in our office with Dr. Parchment on 03/25/2021, at that time she was doing relatively well from a cardiac perspective.  Occasionally she was having some atypical chest pain and palpitations at night.  She presents today for follow-up of her CAD and palpitations.  She had a recent sinus infection at the beginning of January and felt bad for a few days surrounding that, but overall feels back to her baseline.  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  She remains very active watching the children in her daycare. She denies chest pain, palpitations, dyspnea, pnd, orthopnea, n, v, dizziness, syncope, edema, weight gain, or early satiety. We discussed her recent lipid panel that is managed by her PCP, LDL is 46 and well managed. Her triglycerides are slightly elevated at 200. She would to try to make dietary changes and then discuss with her PCP in April.   Past Medical History:  Diagnosis Date   Anxiety    Colonic polyp    Depression    Hypercholesteremia    Hypothyroid     IBS (irritable bowel syndrome)    Migraines    Palpitations    Peripheral neuropathy     Past Surgical History:  Procedure Laterality Date   CESAREAN SECTION     TUBAL LIGATION      Current Medications: Current Meds  Medication Sig   aspirin  EC 81 MG tablet Take 1 tablet (81 mg total) by mouth daily.   atenolol  (TENORMIN ) 50 MG tablet Take 50 mg by mouth daily.   Calcium  Carbonate-Vitamin D (CALCIUM  600 + D PO) Take 1 tablet by mouth daily.   cholecalciferol (VITAMIN D) 1000 UNITS tablet Take 2,000 Units by mouth daily.   citalopram (CELEXA) 20 MG tablet Take 40 mg by mouth daily.   dicyclomine (BENTYL) 20 MG tablet Take 20 mg by mouth as needed. For acid reflux.   fluticasone (FLONASE) 50 MCG/ACT nasal spray Place 2 sprays into both nostrils daily.   HYDROcodone -acetaminophen  (NORCO) 5-325 MG tablet Take 1-2 tablets by mouth daily as needed.   isosorbide  mononitrate (IMDUR ) 30 MG 24 hr tablet TAKE 1 TABLET BY MOUTH DAILY   lansoprazole (PREVACID) 30 MG capsule Take 30 mg by mouth daily at 12 noon.   levothyroxine  (SYNTHROID , LEVOTHROID) 75 MCG tablet Take 75 mcg by mouth daily.   LORazepam (ATIVAN) 1 MG tablet Take 1 mg by mouth every 8 (eight) hours.   Multiple Vitamins-Minerals (MULTIVITAMIN PO) Take 1 tablet  by mouth daily.   omeprazole (PRILOSEC) 40 MG capsule Take 40 mg by mouth daily.   oxyCODONE -acetaminophen  (PERCOCET) 5-325 MG tablet Take 1-2 tablets by mouth every 8 (eight) hours as needed for severe pain.   rosuvastatin  (CRESTOR ) 40 MG tablet TAKE 1 TABLET BY MOUTH DAILY   sucralfate (CARAFATE) 1 g tablet Take 1 g by mouth 2 (two) times daily.   traMADol  (ULTRAM ) 50 MG tablet Take 1 tablet (50 mg total) by mouth 2 (two) times daily as needed.   traZODone (DESYREL) 50 MG tablet Take 25 mg by mouth at bedtime as needed for sleep.      Allergies:   Sulfa antibiotics   Social History   Socioeconomic History   Marital status: Widowed    Spouse name: Not on file    Number of children: Not on file   Years of education: Not on file   Highest education level: Not on file  Occupational History   Not on file  Tobacco Use   Smoking status: Former   Smokeless tobacco: Never  Vaping Use   Vaping Use: Never used  Substance and Sexual Activity   Alcohol use: No   Drug use: No   Sexual activity: Not Currently  Other Topics Concern   Not on file  Social History Narrative   Not on file   Social Determinants of Health   Financial Resource Strain: Not on file  Food Insecurity: Not on file  Transportation Needs: Not on file  Physical Activity: Not on file  Stress: Not on file  Social Connections: Not on file     Family History: The patient's family history includes Heart attack in her sister.  ROS:   Please see the history of present illness.     All other systems reviewed and are negative.  EKGs/Labs/Other Studies Reviewed:    The following studies were reviewed today:  08/18/2019 CTA with FFR -  1. Left Main: normal   2. LAD: Proximal 0.94, mid after first diagonal 0.80, distal 0.79   3. LCX: Proximal 0.95, distal 0.90   4. Ramus: n/a   5. RCA: Proximal 0.99, distal 0.91   IMPRESSION: Borderline mid LAD flow analysis. Would recommend continued aggressive risk factor modification and if symptoms persist, consider cardiac catheterization.  08/18/2019 coronary CTA -  1. Coronary calcium  score of 239. This was 53 percentile for age and sex matched control.   2. Normal coronary origin with right dominance.   3. Three vessel mixed calcified and non calcified plaque that appears predominantly non flow limiting. There is possible mid LAD 50-69% stenosis. Will send for FFR analysis.   4.  Aortic atherosclerosis and mild aortic valve sclerosis.       EKG:  EKG is  ordered today.  The ekg ordered today demonstrates SR, HR65 bpm, consistent with previous EKG tracings.   Recent Labs: No results found for requested labs within last  365 days.  Recent Lipid Panel No results found for: CHOL, TRIG, HDL, CHOLHDL, VLDL, LDLCALC, LDLDIRECT   Risk Assessment/Calculations:                Physical Exam:    VS:  BP 110/68   Pulse 67   Ht 5' 2 (1.575 m)   Wt 136 lb 12.8 oz (62.1 kg)   SpO2 97%   BMI 25.02 kg/m     Wt Readings from Last 3 Encounters:  05/13/22 136 lb 12.8 oz (62.1 kg)  03/25/21 137 lb (62.1 kg)  01/03/21 138 lb (62.6 kg)     GEN:  Well nourished, well developed in no acute distress HEENT: Normal NECK: No JVD; No carotid bruits LYMPHATICS: No lymphadenopathy CARDIAC: RRR, no murmurs, rubs, gallops RESPIRATORY:  Clear to auscultation without rales, wheezing or rhonchi  ABDOMEN: Soft, non-tender, non-distended MUSCULOSKELETAL:  No edema; No deformity  SKIN: Warm and dry NEUROLOGIC:  Alert and oriented x 3 PSYCHIATRIC:  Normal affect   ASSESSMENT:    1. Coronary artery disease involving native coronary artery of native heart with angina pectoris (HCC)   2. Palpitations   3. Mixed hyperlipidemia   4. Hypothyroidism, unspecified type    PLAN:    In order of problems listed above:  Coronary artery disease - CTA with FFR in 2021 showed borderline mid LAD flow analysis, denies chest pain or acute decompensation. She is very active with her job. GDMT of ASA, atenolol , and Imdur .   Palpitations - quiescent. Continue atenolol .   Mixed hyperlipidemia - LDL 46 02/14/22 and well controlled. Her triglycerides are slightly elevated. She would like to make lifestyle changes and does not want to add another medication to her regimen at this time. Managed by her PCP.   Hypothyroidism - managed by PCP       Disposition - f/u with Dr. Jeffrie in 1 year.    Medication Adjustments/Labs and Tests Ordered: Current medicines are reviewed at length with the patient today.  Concerns regarding medicines are outlined above.  Orders Placed This Encounter  Procedures   EKG 12-Lead   No  orders of the defined types were placed in this encounter.   Patient Instructions  Medication Instructions:  Your physician recommends that you continue on your current medications as directed. Please refer to the Current Medication list given to you today.  *If you need a refill on your cardiac medications before your next appointment, please call your pharmacy*   Lab Work: None ordered If you have labs (blood work) drawn today and your tests are completely normal, you will receive your results only by: MyChart Message (if you have MyChart) OR A paper copy in the mail If you have any lab test that is abnormal or we need to change your treatment, we will call you to review the results.   Follow-Up: At [facility], you and your health needs are our priority.  As part of our continuing mission to provide you with exceptional heart care, we have created designated Provider Care Teams.  These Care Teams include your primary Cardiologist (physician) and Advanced Practice Providers (APPs -  Physician Assistants and Nurse Practitioners) who all work together to provide you with the care you need, when you need it.   Your next appointment:   1 year(s)  Provider:   Oneil Jeffrie, MD    Signed, Delon JAYSON Hoover, NP  05/13/2022 3:18 PM    [facility] "

## 2022-05-13 ENCOUNTER — Ambulatory Visit: Payer: Medicare Other | Attending: Physician Assistant | Admitting: Cardiology

## 2022-05-13 ENCOUNTER — Encounter: Payer: Self-pay | Admitting: Physician Assistant

## 2022-05-13 VITALS — BP 110/68 | HR 67 | Ht 62.0 in | Wt 136.8 lb

## 2022-05-13 DIAGNOSIS — R002 Palpitations: Secondary | ICD-10-CM

## 2022-05-13 DIAGNOSIS — N3000 Acute cystitis without hematuria: Secondary | ICD-10-CM | POA: Diagnosis not present

## 2022-05-13 DIAGNOSIS — R052 Subacute cough: Secondary | ICD-10-CM | POA: Diagnosis not present

## 2022-05-13 DIAGNOSIS — I25119 Atherosclerotic heart disease of native coronary artery with unspecified angina pectoris: Secondary | ICD-10-CM

## 2022-05-13 DIAGNOSIS — E039 Hypothyroidism, unspecified: Secondary | ICD-10-CM | POA: Diagnosis not present

## 2022-05-13 DIAGNOSIS — E782 Mixed hyperlipidemia: Secondary | ICD-10-CM

## 2022-05-13 DIAGNOSIS — Z6824 Body mass index (BMI) 24.0-24.9, adult: Secondary | ICD-10-CM | POA: Diagnosis not present

## 2022-05-13 DIAGNOSIS — F5101 Primary insomnia: Secondary | ICD-10-CM | POA: Diagnosis not present

## 2022-05-13 NOTE — Patient Instructions (Signed)
Medication Instructions:  Your physician recommends that you continue on your current medications as directed. Please refer to the Current Medication list given to you today.  *If you need a refill on your cardiac medications before your next appointment, please call your pharmacy*   Lab Work: None ordered If you have labs (blood work) drawn today and your tests are completely normal, you will receive your results only by: MyChart Message (if you have MyChart) OR A paper copy in the mail If you have any lab test that is abnormal or we need to change your treatment, we will call you to review the results.   Follow-Up: At [facility], you and your health needs are our priority.  As part of our continuing mission to provide you with exceptional heart care, we have created designated Provider Care Teams.  These Care Teams include your primary Cardiologist (physician) and Advanced Practice Providers (APPs -  Physician Assistants and Nurse Practitioners) who all work together to provide you with the care you need, when you need it.   Your next appointment:   1 year(s)  Provider:   Mark Skains, MD    

## 2022-05-28 DIAGNOSIS — J0100 Acute maxillary sinusitis, unspecified: Secondary | ICD-10-CM | POA: Diagnosis not present

## 2022-06-16 ENCOUNTER — Ambulatory Visit: Payer: Medicare Other | Admitting: Physician Assistant

## 2022-06-16 ENCOUNTER — Ambulatory Visit (INDEPENDENT_AMBULATORY_CARE_PROVIDER_SITE_OTHER): Payer: Medicare Other

## 2022-06-16 ENCOUNTER — Encounter: Payer: Self-pay | Admitting: Physician Assistant

## 2022-06-16 VITALS — Ht 61.0 in | Wt 136.0 lb

## 2022-06-16 DIAGNOSIS — M25571 Pain in right ankle and joints of right foot: Secondary | ICD-10-CM

## 2022-06-16 DIAGNOSIS — G8929 Other chronic pain: Secondary | ICD-10-CM | POA: Diagnosis not present

## 2022-06-16 DIAGNOSIS — M25561 Pain in right knee: Secondary | ICD-10-CM

## 2022-06-16 DIAGNOSIS — H04123 Dry eye syndrome of bilateral lacrimal glands: Secondary | ICD-10-CM | POA: Diagnosis not present

## 2022-06-16 NOTE — Progress Notes (Signed)
Office Visit Note   Patient: Toni Rich           Date of Birth: [birth year]           MRN: [id number] Visit Date: 06/16/2022              Requested by: Kathyrn Lass, [place],  [place] [address] PCP: Kathyrn Lass, MD  Chief Complaint  Patient presents with   Right Knee - Pain   Right Ankle - Pain      HPI: Toni Rich is a pleasant 77 year old woman with a history of right chronic knee pain.  She normally sees Dr. Erlinda Hong.  She said a couple weeks ago she twisted her ankle when she was walking and stepped in a crack.  Since then she has had right ankle pain that radiates up to her right knee.  Denies any swelling.  She is taking Biofreeze and Tylenol.  She thinks her knee pain is worse since the injury.  Assessment & Plan: Visit Diagnoses:  1. Chronic pain of right knee   2. Pain in right ankle and joints of right foot     Plan: Her right ankle pain is resolved her x-rays are reassuring she has no swelling or clinical findings of significant injury.  Her right knee is not really hurting her today she has no effusion or redness.  We talked about the difference between steroid injections and viscosupplementation.  Her knee is not really bothering her much today but she understood the difference.  She will contact us if she wants to go forward with viscosupplementation or another steroid injection This patient is diagnosed with osteoarthritis of the knee(s).    Radiographs show evidence of joint space narrowing, osteophytes, subchondral sclerosis and/or subchondral cysts.  This patient has knee pain which interferes with functional and activities of daily living.    This patient has experienced inadequate response, adverse effects and/or intolerance with conservative treatments such as acetaminophen, NSAIDS, topical creams, physical therapy or regular exercise, knee bracing and/or weight loss.   This patient has experienced inadequate response or has a  contraindication to intra articular steroid injections for at least 3 months.   This patient is not scheduled to have a total knee replacement within 6 months of starting treatment with viscosupplementation.  Follow-Up Instructions:   Ortho Exam  Patient is alert, oriented, no adenopathy, well-dressed, normal affect, normal respiratory effort. Right knee no effusion no erythema the knee is cool.  She has good varus valgus stability no tenderness over the MCL.  Mild tenderness over the medial joint line.  Good flexion extension strength.  Compartments are soft. Right ankle no swelling no erythema no ecchymosis.  She has strong eversion inversion dorsiflexion and plantarflexion.  She is neurovascularly intact.  Imaging: No results found. No images are attached to the encounter.  Labs: No results found for: "HGBA1C", "ESRSEDRATE", "CRP", "LABURIC", "REPTSTATUS", "GRAMSTAIN", "CULT", "LABORGA"   Lab Results  Component Value Date   ALBUMIN 2.8 (L) 12/15/2011   ALBUMIN 3.3 (L) 12/14/2011   ALBUMIN 4.3 12/13/2011    Lab Results  Component Value Date   MG 1.9 12/17/2011   No results found for: "VD25OH"  No results found for: "PREALBUMIN"    Latest Ref Rng & Units 12/16/2011    5:55 AM 12/15/2011    5:00 AM 12/14/2011    5:00 AM  CBC EXTENDED  WBC 4.0 - 10.5 K/uL 10.1  14.7  16.7   RBC  3.87 - 5.11 MIL/uL 3.73  3.88  4.13   Hemoglobin 12.0 - 15.0 g/dL 12.5  12.6  13.7   HCT 36.0 - 46.0 % 35.6  37.0  39.5   Platelets 150 - 400 K/uL 199  193  228      Body mass index is 25.7 kg/m.  Orders:  Orders Placed This Encounter  Procedures   XR KNEE 3 VIEW RIGHT   XR Ankle Complete Right   No orders of the defined types were placed in this encounter.    Procedures: No procedures performed  Clinical Data: No additional findings.  ROS:  All other systems negative, except as noted in the HPI. Review of Systems  Objective: Vital Signs: Ht '5\' 1"'$  (1.549 m)   Wt 136 lb (61.7 kg)    BMI 25.70 kg/m   Specialty Comments:  No specialty comments available.  PMFS History: Patient Active Problem List   Diagnosis Date Noted   Coronary artery disease involving native coronary artery of native heart with angina pectoris ([place]) 03/25/2021   Mixed hyperlipidemia 03/25/2021   Right thigh pain 03/25/2019   Right knee pain 10/28/2018   Primary osteoarthritis of first carpometacarpal joint of left hand 10/28/2018   Hypokalemia 12/17/2011   Bronchitis 12/17/2011   Bilateral atelectasis 12/15/2011   Colitis 12/13/2011   Leukocytosis 12/13/2011   Past Medical History:  Diagnosis Date   Anxiety    Colonic polyp    Depression    Hypercholesteremia    Hypothyroid    IBS (irritable bowel syndrome)    Migraines    Palpitations    Peripheral neuropathy     Family History  Problem Relation Age of Onset   Heart attack Sister     Past Surgical History:  Procedure Laterality Date   CESAREAN SECTION     TUBAL LIGATION     Social History   Occupational History   Not on file  Tobacco Use   Smoking status: Former   Smokeless tobacco: Never  Vaping Use   Vaping Use: Never used  Substance and Sexual Activity   Alcohol use: No   Drug use: No   Sexual activity: Not Currently

## 2022-06-17 ENCOUNTER — Other Ambulatory Visit: Payer: Self-pay | Admitting: Cardiology

## 2022-06-17 DIAGNOSIS — E7800 Pure hypercholesterolemia, unspecified: Secondary | ICD-10-CM

## 2022-06-17 DIAGNOSIS — Z8249 Family history of ischemic heart disease and other diseases of the circulatory system: Secondary | ICD-10-CM

## 2022-06-20 ENCOUNTER — Telehealth: Payer: Self-pay | Admitting: Cardiology

## 2022-06-20 NOTE — Telephone Encounter (Signed)
Return call to [place] to verify patient's cardiologist name.

## 2022-06-20 NOTE — Telephone Encounter (Signed)
Billing had the following in voice mail:  Cristie Hem with [facility] called regarding rx for rosuvastatin.  Because it was written by an APP, they need the name of the supervising physician.  They can be reached at [phone], reference [id number].

## 2022-07-02 DIAGNOSIS — J329 Chronic sinusitis, unspecified: Secondary | ICD-10-CM | POA: Diagnosis not present

## 2022-07-18 ENCOUNTER — Telehealth: Payer: Self-pay | Admitting: Cardiology

## 2022-07-18 NOTE — Telephone Encounter (Signed)
Left message for pt to call back to discuss

## 2022-07-18 NOTE — Telephone Encounter (Signed)
Pt c/o medication issue:  1. Name of Medication:  rosuvastatin (CRESTOR) 40 MG tablet   2. How are you currently taking this medication (dosage and times per day)? As prescribed   3. Are you having a reaction (difficulty breathing--STAT)? Possibly   4. What is your medication issue? Patient states that she is having a harder time squatting down to pick something up without having to hold on to something and she states the only change has been this medication and would like a call back to discuss if this could be the issue. Please advise.

## 2022-07-21 NOTE — Telephone Encounter (Signed)
Unable to leave voicemail.

## 2022-07-21 NOTE — Telephone Encounter (Signed)
Patient is returning call and is requesting return call.  

## 2022-07-22 NOTE — Telephone Encounter (Signed)
Patient called again to check on status of her call.  

## 2022-07-22 NOTE — Telephone Encounter (Signed)
Called pt she states when she goes to stand up her legs are weak. Pt denies shortness of breath, chest pain, dizziness. Pt advised to reach out to her PCP in regards to this matter. She has an upcoming appointment Friday with her PCP. Pt thanked me for calling her. No further concerns voiced at this time.

## 2022-07-22 NOTE — Telephone Encounter (Signed)
Patient is returning call. Please advise? 

## 2022-07-25 DIAGNOSIS — H31013 Macula scars of posterior pole (postinflammatory) (post-traumatic), bilateral: Secondary | ICD-10-CM | POA: Diagnosis not present

## 2022-07-25 DIAGNOSIS — Z0000 Encounter for general adult medical examination without abnormal findings: Secondary | ICD-10-CM | POA: Diagnosis not present

## 2022-07-25 DIAGNOSIS — H04123 Dry eye syndrome of bilateral lacrimal glands: Secondary | ICD-10-CM | POA: Diagnosis not present

## 2022-07-25 DIAGNOSIS — H02403 Unspecified ptosis of bilateral eyelids: Secondary | ICD-10-CM | POA: Diagnosis not present

## 2022-07-25 DIAGNOSIS — H353132 Nonexudative age-related macular degeneration, bilateral, intermediate dry stage: Secondary | ICD-10-CM | POA: Diagnosis not present

## 2022-08-08 DIAGNOSIS — M79651 Pain in right thigh: Secondary | ICD-10-CM | POA: Diagnosis not present

## 2022-08-08 DIAGNOSIS — M79652 Pain in left thigh: Secondary | ICD-10-CM | POA: Diagnosis not present

## 2022-08-25 DIAGNOSIS — M7581 Other shoulder lesions, right shoulder: Secondary | ICD-10-CM | POA: Diagnosis not present

## 2022-09-04 ENCOUNTER — Other Ambulatory Visit: Payer: Self-pay

## 2022-09-04 MED ORDER — ISOSORBIDE MONONITRATE ER 30 MG PO TB24: 30.0000 mg | ORAL_TABLET | Freq: Every day | ORAL | 2 refills | Status: DC

## 2022-09-04 NOTE — Telephone Encounter (Signed)
Pt's medication was sent to pt's pharmacy as requested. Confirmation received.  °

## 2022-09-11 DIAGNOSIS — H57813 Brow ptosis, bilateral: Secondary | ICD-10-CM | POA: Diagnosis not present

## 2022-09-17 DIAGNOSIS — S4991XA Unspecified injury of right shoulder and upper arm, initial encounter: Secondary | ICD-10-CM | POA: Diagnosis not present

## 2022-09-17 DIAGNOSIS — W19XXXA Unspecified fall, initial encounter: Secondary | ICD-10-CM | POA: Diagnosis not present

## 2022-09-17 DIAGNOSIS — S50311A Abrasion of right elbow, initial encounter: Secondary | ICD-10-CM | POA: Diagnosis not present

## 2022-11-11 ENCOUNTER — Telehealth: Payer: Self-pay

## 2022-11-11 ENCOUNTER — Other Ambulatory Visit: Payer: Self-pay

## 2022-11-11 ENCOUNTER — Other Ambulatory Visit: Payer: Self-pay | Admitting: Physician Assistant

## 2022-11-11 ENCOUNTER — Ambulatory Visit (INDEPENDENT_AMBULATORY_CARE_PROVIDER_SITE_OTHER): Payer: Medicare Other | Admitting: Sports Medicine

## 2022-11-11 ENCOUNTER — Ambulatory Visit: Payer: Medicare Other | Admitting: Physician Assistant

## 2022-11-11 ENCOUNTER — Ambulatory Visit (INDEPENDENT_AMBULATORY_CARE_PROVIDER_SITE_OTHER): Payer: Medicare Other

## 2022-11-11 ENCOUNTER — Telehealth: Payer: Self-pay | Admitting: Physician Assistant

## 2022-11-11 DIAGNOSIS — G8929 Other chronic pain: Secondary | ICD-10-CM

## 2022-11-11 DIAGNOSIS — M25511 Pain in right shoulder: Secondary | ICD-10-CM

## 2022-11-11 DIAGNOSIS — M19019 Primary osteoarthritis, unspecified shoulder: Secondary | ICD-10-CM | POA: Diagnosis not present

## 2022-11-11 MED ORDER — LIDOCAINE HCL 1 % IJ SOLN
0.5000 mL | INTRAMUSCULAR | Status: AC | PRN
Start: 2022-11-11 — End: 2022-11-11
  Administered 2022-11-11: .5 mL

## 2022-11-11 MED ORDER — TRAMADOL HCL 50 MG PO TABS: 50.0000 mg | ORAL_TABLET | Freq: Two times a day (BID) | ORAL | 0 refills | Status: DC | PRN

## 2022-11-11 MED ORDER — METHYLPREDNISOLONE ACETATE 40 MG/ML IJ SUSP
40.0000 mg | INTRAMUSCULAR | Status: AC | PRN
Start: 2022-11-11 — End: 2022-11-11
  Administered 2022-11-11: 40 mg via INTRA_ARTICULAR

## 2022-11-11 NOTE — Telephone Encounter (Signed)
Patient called wanting to know if someone could call the pharmacy concerning her Tramadol Rx.  Cb# for patient is [phone].  Please advise.  Thank you.

## 2022-11-11 NOTE — Telephone Encounter (Signed)
Pharmacy is closed for lunch. Will try again later.

## 2022-11-11 NOTE — Telephone Encounter (Signed)
sent 

## 2022-11-11 NOTE — Progress Notes (Signed)
   Procedure Note  Patient: Toni Rich             Date of Birth: 13-Jan-1946           MRN: [id number]             Visit Date: 11/11/2022  Procedures: Visit Diagnoses:  1. Chronic right shoulder pain   2. AC joint arthropathy    Medium Joint Inj: R acromioclavicular on 11/11/2022 9:21 AM Indications: pain Details: 25 G 1.5 in needle, ultrasound-guided anterior approach Medications: 0.5 mL lidocaine 1 %; 40 mg methylPREDNISolone acetate 40 MG/ML  US-guided AC Joint injection, right shoulder After discussion on risks/benefits/indications, informed verbal consent was obtained. A timeout was then performed. The patient was seated in examination room. The area overlying the [facility] joint of the shoulder was prepped with Betadine and alcohol swab then utilizing ultrasound guidance, patient's AC joint was injected using a 25G, 1.5" needle with 0.5:1.85mL lidocaine:depomedrol of injectate via an out-of-plane, walk-down approach. Visualization of injectate flow was noted under ultrasound guidance. Patient tolerated the procedure well without immediate complications.   Procedure, treatment alternatives, risks and benefits explained, specific risks discussed. Consent was given by the patient. Immediately prior to procedure a time out was called to verify the correct patient, procedure, equipment, support staff and site/side marked as required. Patient was prepped and draped in the usual sterile fashion.     - I evaluated the patient about 5 minutes post-injection and she had improvement in pain and range of motion - if she does not receive > 50% relief over the next 10 days, she will present for re-evaluation.  - follow-up with Mikey Kirschner then as indicated; I am happy to see them as needed  Madelyn Brunner, DO Primary Care Sports Medicine Physician  [facility]  This note was dictated using Dragon naturally speaking software and may contain errors in syntax, spelling, or  content which have not been identified prior to signing this note.

## 2022-11-11 NOTE — Progress Notes (Signed)
Office Visit Note   Patient: Toni Rich           Date of Birth: [birth year]           MRN: [id number] Visit Date: 11/11/2022              Requested by: Sigmund Hazel, MD [address] [place],  [place] [address] PCP: Sigmund Hazel, MD   Assessment & Plan: Visit Diagnoses:  1. Chronic right shoulder pain     Plan: Impression is right shoulder AC joint arthropathy as well as anterior shoulder pain likely from underlying biceps tendinitis.  At this point, her [facility] joint appears to be most symptomatic.  I would like to make a referral to Dr. Shon Baton for ultrasound-guided cortisone injection to the [facility] joint.  Should this injection help this aspect of her shoulder but she continues to have pain to the anterior shoulder, she will follow-up with Dr. Shon Baton for glenohumeral cortisone injection.  She will follow-up with [place] as needed.  Call with concerns or questions in the meantime.  Follow-Up Instructions: Return if symptoms worsen or fail to improve.   Orders:  Orders Placed This Encounter  Procedures   XR Shoulder Right   No orders of the defined types were placed in this encounter.     Procedures: No procedures performed   Clinical Data: No additional findings.   Subjective: Chief Complaint  Patient presents with   Right Shoulder - Pain    HPI patient is a pleasant 77 year old female who comes in today with right shoulder pain.  Symptoms began a few months ago but worsened about 1 month ago after sustaining mechanical fall landing on her right side.  The pain she has is primarily to the top of her shoulder with radiation into the anterior aspect.  Some pain radiating to the right lateral neck.  No weakness.  Symptoms are worse when she is moving her shoulder in a certain direction as well as when she is sleeping on the right side.  She has been taking Tylenol without relief.  She denies any paresthesias to the right upper extremity.  Review of Systems as detailed in HPI.   All others reviewed and are negative.   Objective: Vital Signs: There were no vitals taken for this visit.  Physical Exam well-developed well-nourished female in no acute distress.  Alert and oriented x 3.  Ortho Exam right shoulder exam: Forward flexion to approximately 160 degrees.  Near full internal and external rotation.  No pain or weakness with empty can testing.  She does have tenderness to the [facility] joint.  Pain to the anterior shoulder with speeds and O'Brien testing.  Negative bearhug.  Full strength throughout.  She is neurovascularly intact distally.  Specialty Comments:  No specialty comments available.  Imaging: XR Shoulder Right  Result Date: 11/11/2022 X-rays demonstrate moderate degenerative changes to the [facility] joint    PMFS History: Patient Active Problem List   Diagnosis Date Noted   Coronary artery disease involving native coronary artery of native heart with angina pectoris (HCC) 03/25/2021   Mixed hyperlipidemia 03/25/2021   Right thigh pain 03/25/2019   Right knee pain 10/28/2018   Primary osteoarthritis of first carpometacarpal joint of left hand 10/28/2018   Hypokalemia 12/17/2011   Bronchitis 12/17/2011   Bilateral atelectasis 12/15/2011   Colitis 12/13/2011   Leukocytosis 12/13/2011   Past Medical History:  Diagnosis Date   Anxiety    Colonic polyp    Depression  Hypercholesteremia    Hypothyroid    IBS (irritable bowel syndrome)    Migraines    Palpitations    Peripheral neuropathy     Family History  Problem Relation Age of Onset   Heart attack Sister     Past Surgical History:  Procedure Laterality Date   CESAREAN SECTION     TUBAL LIGATION     Social History   Occupational History   Not on file  Tobacco Use   Smoking status: Former   Smokeless tobacco: Never  Vaping Use   Vaping status: Never Used  Substance and Sexual Activity   Alcohol use: No   Drug use: No   Sexual activity: Not Currently

## 2022-11-11 NOTE — Telephone Encounter (Signed)
Patient called asked for a call back when Rx (Tramadol) is sent to the pharmacy. The number to contact patient is [phone]

## 2022-11-12 NOTE — Telephone Encounter (Signed)
Tried to call patient and let her know, no answer. No voicemail.

## 2022-11-27 ENCOUNTER — Telehealth: Payer: Self-pay | Admitting: Sports Medicine

## 2022-11-27 ENCOUNTER — Other Ambulatory Visit: Payer: Self-pay | Admitting: Physician Assistant

## 2022-11-27 MED ORDER — TRAMADOL HCL 50 MG PO TABS: 50.0000 mg | ORAL_TABLET | Freq: Two times a day (BID) | ORAL | 2 refills | Status: DC | PRN

## 2022-11-27 NOTE — Telephone Encounter (Signed)
Sent in tramadol

## 2022-11-27 NOTE — Telephone Encounter (Signed)
Patient called and said she got an injection and needs something for pain. CB#[phone]

## 2022-11-28 ENCOUNTER — Telehealth: Payer: Self-pay | Admitting: Physician Assistant

## 2022-11-28 NOTE — Telephone Encounter (Signed)
Pt would like to know if there is something she can take during the day for pain because taking the tramadol during the day makes her drowsy and she would only like to take that at night it can be prescribed or OTC please advise pt would like a call

## 2022-12-01 ENCOUNTER — Telehealth: Payer: Self-pay | Admitting: Sports Medicine

## 2022-12-01 NOTE — Telephone Encounter (Signed)
Pt called requesting a call from r Shon Baton about her headaches she has been experiencing since injection. Please call pt with medical advice. Pt phone number is [phone].

## 2022-12-10 ENCOUNTER — Ambulatory Visit: Payer: Medicare Other | Admitting: Sports Medicine

## 2022-12-10 ENCOUNTER — Other Ambulatory Visit: Payer: Self-pay

## 2022-12-10 ENCOUNTER — Encounter: Payer: Self-pay | Admitting: Sports Medicine

## 2022-12-10 VITALS — BP 112/59 | HR 70

## 2022-12-10 DIAGNOSIS — G8929 Other chronic pain: Secondary | ICD-10-CM

## 2022-12-10 DIAGNOSIS — M25511 Pain in right shoulder: Secondary | ICD-10-CM | POA: Diagnosis not present

## 2022-12-10 DIAGNOSIS — M19011 Primary osteoarthritis, right shoulder: Secondary | ICD-10-CM

## 2022-12-10 MED ORDER — BUPIVACAINE HCL 0.25 % IJ SOLN
2.0000 mL | INTRAMUSCULAR | Status: AC | PRN
Start: 2022-12-10 — End: 2022-12-10
  Administered 2022-12-10: 2 mL via INTRA_ARTICULAR

## 2022-12-10 MED ORDER — LIDOCAINE HCL 1 % IJ SOLN
2.0000 mL | INTRAMUSCULAR | Status: AC | PRN
Start: 2022-12-10 — End: 2022-12-10
  Administered 2022-12-10: 2 mL

## 2022-12-10 MED ORDER — METHYLPREDNISOLONE ACETATE 40 MG/ML IJ SUSP
80.0000 mg | INTRAMUSCULAR | Status: AC | PRN
Start: 2022-12-10 — End: 2022-12-10
  Administered 2022-12-10: 80 mg via INTRA_ARTICULAR

## 2022-12-10 NOTE — Progress Notes (Signed)
Toni Rich - 77 y.o. female MRN [id number]  Date of birth: 05-04-1945  Office Visit Note: Visit Date: 12/10/2022 PCP: Toni Hazel, MD Referred by: Toni Hazel, MD  Subjective: Chief Complaint  Patient presents with   Right Shoulder - Pain    GH injection. The AC inj helped for a short while and then the pain returned.   HPI: Toni Rich is a pleasant 77 y.o. female who presents today for follow-up of right shoulder pain.  She did undergo ultrasound-guided right AC joint injection on 11/11/2022.  She did reports she had good relief of her anterior (AC-joint) pain initially but some of the pain has returned.  She is also having pain over the posterior aspect of the shoulder joint and around bicep.  Shoulder better, but still having some issues.  She did take some tramadol for this, but this made her head hurt so she has discontinued this.  She is currently using Tylenol.  Pertinent ROS were reviewed with the patient and found to be negative unless otherwise specified above in HPI.   Assessment & Plan: Visit Diagnoses:  1. Chronic right shoulder pain   2. Arthritis of right acromioclavicular joint    Plan: Discussed with Toni Rich the nature of her acute on chronic right shoulder pain which I feel is a combination of right AC joint arthritis as well as bicipital tendinitis.  She did receive fairly good relief of her AC joint pain temporarily from the injection, but her shoulder joint has still been bothersome.  Through shared decision making, elected to proceed with ultrasound-guided glenohumeral joint injection.  She may use Tylenol and/or ice for any postinjection pain.  She will follow-up with Toni Rich/Dr. Roda Rich in about 3 weeks or may call them to notify of her improvement to discuss next steps.  I am happy to see her otherwise as needed.  Follow-up: Return for f/u with Toni Rich in 2-3 weeks for R-shoulder.   Meds & Orders: No orders of the defined types were placed  in this encounter.   Orders Placed This Encounter  Procedures   Large Joint Inj: R glenohumeral   US Guided Needle Placement - No Linked Charges     Procedures: Large Joint Inj: R glenohumeral on 12/10/2022 10:48 AM Indications: pain Details: 22 G 3.5 in needle, ultrasound-guided posterior approach Medications: 2 mL lidocaine 1 %; 2 mL bupivacaine 0.25 %; 80 mg methylPREDNISolone acetate 40 MG/ML Outcome: tolerated well, no immediate complications  US-guided glenohumeral joint injection, right shoulder After discussion on risks/benefits/indications, informed verbal consent was obtained. A timeout was then performed. The patient was positioned lying lateral recumbent on examination table. The patient's shoulder was prepped with betadine and multiple alcohol swabs and utilizing ultrasound guidance, the patient's glenohumeral joint was identified on ultrasound. Using ultrasound guidance a 22-gauge, 3.5 inch needle with a mixture of 2:2:2 cc's lidocaine:bupivicaine:depomedrol was directed from a lateral to medial direction via in-plane technique into the glenohumeral joint with visualization of appropriate spread of injectate into the joint. Patient tolerated the procedure well without immediate complications.      Procedure, treatment alternatives, risks and benefits explained, specific risks discussed. Consent was given by the patient. Immediately prior to procedure a time out was called to verify the correct patient, procedure, equipment, support staff and site/side marked as required. Patient was prepped and draped in the usual sterile fashion.          Clinical History: No specialty comments available.  She reports that  she has quit smoking. She has never used smokeless tobacco. No results for input(s): "HGBA1C", "LABURIC" in the last 8760 hours.  Objective:   Vital Signs: BP (!) 112/59 (BP Location: Left Arm, Patient Position: Sitting, Cuff Size: Normal)   Pulse 70   Physical Exam   Gen: Well-appearing, in no acute distress; non-toxic CV:  Well-perfused. Warm.  Resp: Breathing unlabored on room air; no wheezing. Psych: Fluid speech in conversation; appropriate affect; normal thought process Neuro: Sensation intact throughout. No gross coordination deficits.   Ortho Exam - Right shoulder: Mild TTP over the [facility] joint as well as some hypertonicity of the right trapezius muscle.  There is full active and passive range of motion although some pain at endrange external rotation and abduction.  Rotator cuff testing feels intact without notable weakness.  No redness swelling or effusion.  Imaging:  XR Shoulder Right X-rays demonstrate moderate degenerative changes to the [facility] joint  Past Medical/Family/Surgical/Social History: Medications & Allergies reviewed per EMR, new medications updated. Patient Active Problem List   Diagnosis Date Noted   Coronary artery disease involving native coronary artery of native heart with angina pectoris (HCC) 03/25/2021   Mixed hyperlipidemia 03/25/2021   Right thigh pain 03/25/2019   Right knee pain 10/28/2018   Primary osteoarthritis of first carpometacarpal joint of left hand 10/28/2018   Hypokalemia 12/17/2011   Bronchitis 12/17/2011   Bilateral atelectasis 12/15/2011   Colitis 12/13/2011   Leukocytosis 12/13/2011   Past Medical History:  Diagnosis Date   Anxiety    Colonic polyp    Depression    Hypercholesteremia    Hypothyroid    IBS (irritable bowel syndrome)    Migraines    Palpitations    Peripheral neuropathy    Family History  Problem Relation Age of Onset   Heart attack Sister    Past Surgical History:  Procedure Laterality Date   CESAREAN SECTION     TUBAL LIGATION     Social History   Occupational History   Not on file  Tobacco Use   Smoking status: Former   Smokeless tobacco: Never  Vaping Use   Vaping status: Never Used  Substance and Sexual Activity   Alcohol use: No   Drug use: No   Sexual  activity: Not Currently

## 2022-12-19 DIAGNOSIS — R002 Palpitations: Secondary | ICD-10-CM | POA: Diagnosis not present

## 2022-12-19 DIAGNOSIS — E039 Hypothyroidism, unspecified: Secondary | ICD-10-CM | POA: Diagnosis not present

## 2022-12-19 DIAGNOSIS — Z23 Encounter for immunization: Secondary | ICD-10-CM | POA: Diagnosis not present

## 2022-12-19 DIAGNOSIS — Z6824 Body mass index (BMI) 24.0-24.9, adult: Secondary | ICD-10-CM | POA: Diagnosis not present

## 2022-12-19 DIAGNOSIS — M8580 Other specified disorders of bone density and structure, unspecified site: Secondary | ICD-10-CM | POA: Diagnosis not present

## 2022-12-19 DIAGNOSIS — I25119 Atherosclerotic heart disease of native coronary artery with unspecified angina pectoris: Secondary | ICD-10-CM | POA: Diagnosis not present

## 2022-12-19 DIAGNOSIS — I209 Angina pectoris, unspecified: Secondary | ICD-10-CM | POA: Diagnosis not present

## 2022-12-19 DIAGNOSIS — F5101 Primary insomnia: Secondary | ICD-10-CM | POA: Diagnosis not present

## 2022-12-19 DIAGNOSIS — I700 Atherosclerosis of aorta: Secondary | ICD-10-CM | POA: Diagnosis not present

## 2022-12-19 DIAGNOSIS — E7800 Pure hypercholesterolemia, unspecified: Secondary | ICD-10-CM | POA: Diagnosis not present

## 2023-01-12 ENCOUNTER — Encounter: Payer: Self-pay | Admitting: Sports Medicine

## 2023-01-12 ENCOUNTER — Ambulatory Visit: Payer: Medicare Other | Admitting: Sports Medicine

## 2023-01-12 DIAGNOSIS — M19011 Primary osteoarthritis, right shoulder: Secondary | ICD-10-CM

## 2023-01-12 DIAGNOSIS — M25511 Pain in right shoulder: Secondary | ICD-10-CM | POA: Diagnosis not present

## 2023-01-12 DIAGNOSIS — M7521 Bicipital tendinitis, right shoulder: Secondary | ICD-10-CM | POA: Diagnosis not present

## 2023-01-12 DIAGNOSIS — G8929 Other chronic pain: Secondary | ICD-10-CM | POA: Diagnosis not present

## 2023-01-12 NOTE — Progress Notes (Signed)
Patient says that the shot helped the pain in the back of her shoulder, but that the pain in the front of her shoulder is still there. Patient points over [facility] joint when describing her pain. She says that she feels it the most when reaching overhead. Patient denies any new injury since her last visit.

## 2023-01-12 NOTE — Progress Notes (Addendum)
Toni Rich - 77 y.o. female MRN [id number]  Date of birth: 1945-10-16  Office Visit Note: Visit Date: 01/12/2023 PCP: Sigmund Hazel, MD Referred by: Sigmund Hazel, MD  Subjective: Chief Complaint  Patient presents with   Right Shoulder - Follow-up, Pain   HPI: Toni Rich is a pleasant 77 y.o. female who presents today for follow-up of chronic right shoulder pain.  She did undergo ultrasound-guided right AC joint injection on 11/11/2022, and then subsequent GHJ injection on 12/10/22.  Her left shoulder pain and biceps related pain essentially resolved after glenohumeral joint injection.  Her motion is improved as well but she is still having pain over the top of the shoulder, this has worsened over the last few weeks.  She did receive relief after the [facility] joint injection but this has returned unfortunately.  She was taking Tylenol, was also prescribed tramadol 50 mg to take twice daily as needed.  Pertinent ROS were reviewed with the patient and found to be negative unless otherwise specified above in HPI.   Assessment & Plan: Visit Diagnoses:  1. Arthritis of right acromioclavicular joint   2. Chronic right shoulder pain   3. Biceps tendinitis of right shoulder    Plan: Laronica has had good relief of her shoulder joint pain and her biceps tendinopathy status post glenohumeral joint injection.  She previously received relief from her [facility] joint pain after injection months ago but this has unfortunately been it reexacerbated.  I do think it is important that we get her into formalized physical therapy to help stabilize the shoulder, referral was sent today.  She is interested in repeat ultrasound-guided [facility] joint injection, but did advise her that it is too soon to repeat this injection.  She may continue Tylenol as well as tramadol 50 mg twice daily as needed for pain control.  She will follow-up in about 4 weeks and we will repeat evaluate the shoulder after starting formalized physical  therapy.  May consider injection at that time if her pain is not improved.  Ultimately we will get her back to Dr. Roda Shutters and Mikey Kirschner following this.  Follow-up: f/u in 4 weeks (consider AC-joint injection if not improved)   Meds & Orders: No orders of the defined types were placed in this encounter.   Orders Placed This Encounter  Procedures   Ambulatory referral to Physical Therapy     Procedures: No procedures performed      Clinical History: No specialty comments available.  She reports that she has quit smoking. She has never used smokeless tobacco. No results for input(s): "HGBA1C", "LABURIC" in the last 8760 hours.  Objective:    Physical Exam  Gen: Well-appearing, in no acute distress; non-toxic GL:OVFI-EPPIRJJO. Warm.  Resp: Breathing unlabored on room air; no wheezing. Psych: Fluid speech in conversation; appropriate affect; normal thought process Neuro: Sensation intact throughout. No gross coordination deficits.   Ortho Exam - Right shoulder: + TTP over the [facility] joint.  There is positive crossarm adduction, positive scarf's test.  There is full active range of motion about the shoulder in all directions.  Imaging:  *Independent review and interpretation of 4 views right shoulder from 11/11/2022 was performed by myself today.  X-rays demonstrate moderate degenerative changes of the [facility] joint.  There is a small spur off the inferior aspect of the humeral head but no significant glenohumeral joint arthritis.  No acute fracture noted.  Past Medical/Family/Surgical/Social History: Medications & Allergies reviewed per EMR, new medications updated.  Patient Active Problem List   Diagnosis Date Noted   Coronary artery disease involving native coronary artery of native heart with angina pectoris (HCC) 03/25/2021   Mixed hyperlipidemia 03/25/2021   Right thigh pain 03/25/2019   Right knee pain 10/28/2018   Primary osteoarthritis of first carpometacarpal joint of left  hand 10/28/2018   Hypokalemia 12/17/2011   Bronchitis 12/17/2011   Bilateral atelectasis 12/15/2011   Colitis 12/13/2011   Leukocytosis 12/13/2011   Past Medical History:  Diagnosis Date   Anxiety    Colonic polyp    Depression    Hypercholesteremia    Hypothyroid    IBS (irritable bowel syndrome)    Migraines    Palpitations    Peripheral neuropathy    Family History  Problem Relation Age of Onset   Heart attack Sister    Past Surgical History:  Procedure Laterality Date   CESAREAN SECTION     TUBAL LIGATION     Social History   Occupational History   Not on file  Tobacco Use   Smoking status: Former   Smokeless tobacco: Never  Vaping Use   Vaping status: Never Used  Substance and Sexual Activity   Alcohol use: No   Drug use: No   Sexual activity: Not Currently

## 2023-01-20 ENCOUNTER — Ambulatory Visit: Payer: Medicare Other | Admitting: Sports Medicine

## 2023-01-21 ENCOUNTER — Ambulatory Visit (INDEPENDENT_AMBULATORY_CARE_PROVIDER_SITE_OTHER): Payer: Medicare Other | Admitting: Physical Therapy

## 2023-01-21 ENCOUNTER — Encounter: Payer: Self-pay | Admitting: Physical Therapy

## 2023-01-21 ENCOUNTER — Other Ambulatory Visit: Payer: Self-pay

## 2023-01-21 DIAGNOSIS — M25611 Stiffness of right shoulder, not elsewhere classified: Secondary | ICD-10-CM

## 2023-01-21 DIAGNOSIS — G8929 Other chronic pain: Secondary | ICD-10-CM

## 2023-01-21 DIAGNOSIS — M6281 Muscle weakness (generalized): Secondary | ICD-10-CM | POA: Diagnosis not present

## 2023-01-21 DIAGNOSIS — M25511 Pain in right shoulder: Secondary | ICD-10-CM

## 2023-01-21 NOTE — Therapy (Signed)
OUTPATIENT PHYSICAL THERAPY SHOULDER EVALUATION   Patient Name: Toni Rich MRN: [id number] DOB:09-09-1945, 77 y.o., female Today's Date: 01/21/2023  END OF SESSION:  PT End of Session - 01/21/23 0809     Visit Number 1    Number of Visits 20    Date for PT Re-Evaluation 04/03/23    PT Start Time 0803    PT Stop Time 0843    PT Time Calculation (min) 40 min    Activity Tolerance Patient tolerated treatment well    Behavior During Therapy [facility] for tasks assessed/performed             Past Medical History:  Diagnosis Date   Anxiety    Colonic polyp    Depression    Hypercholesteremia    Hypothyroid    IBS (irritable bowel syndrome)    Migraines    Palpitations    Peripheral neuropathy    Past Surgical History:  Procedure Laterality Date   CESAREAN SECTION     TUBAL LIGATION     Patient Active Problem List   Diagnosis Date Noted   Coronary artery disease involving native coronary artery of native heart with angina pectoris (HCC) 03/25/2021   Mixed hyperlipidemia 03/25/2021   Right thigh pain 03/25/2019   Right knee pain 10/28/2018   Primary osteoarthritis of first carpometacarpal joint of left hand 10/28/2018   Hypokalemia 12/17/2011   Bronchitis 12/17/2011   Bilateral atelectasis 12/15/2011   Colitis 12/13/2011   Leukocytosis 12/13/2011    PCP: Sigmund Hazel, MD   REFERRING PROVIDER: Madelyn Brunner, DO   REFERRING DIAG:  Diagnosis  M25.511,G89.29 (ICD-10-CM) - Chronic right shoulder pain  M19.011 (ICD-10-CM) - Arthritis of right acromioclavicular joint    THERAPY DIAG:  Chronic right shoulder pain  Muscle weakness (generalized)  Stiffness of right shoulder, not elsewhere classified  Rationale for Evaluation and Treatment: Rehabilitation  ONSET DATE:   SUBJECTIVE:                                                                                                                                                                                       SUBJECTIVE STATEMENT: Pt arriving today reporting 4/10 pain in Rt shoulder. Pt stating her pain has been on-going for over 6 months. Pt stating some relief following her injection but still reporting pain. Pt reporting difficulty with ADL's, household chores, and keeping a 78 year old a few days each week.   PERTINENT HISTORY: She did undergo ultrasound-guided right AC joint injection on 11/11/2022, and then subsequent GHJ injection on 12/10/22.  Her left shoulder pain and biceps related pain essentially resolved after glenohumeral joint injection.  Her motion is  improved as well but she is still having pain over the top of the shoulder, this has worsened over the last few weeks.  She did receive relief after the [facility] joint injection but this has returned unfortunately.  She was taking Tylenol, was also prescribed tramadol 50 mg to take twice daily as needed.  PMH: anxiety, depression, IBS, peripheral neuropathy, migraines, hypothyroid, C-section, tubal ligation  PAIN:  NPRS scale: 4/10 Pain location: Rt shoulder, anterior mostly Pain description: throbbing Aggravating factors: sleeping Relieving factors: tylenol  PRECAUTIONS: None  WEIGHT BEARING RESTRICTIONS: No  FALLS:  Has patient fallen in last 6 months? 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 few days a week  PLOF: Independent  PATIENT GOALS:Stop hurting  Next MD visit:   OBJECTIVE:   DIAGNOSTIC FINDINGS: 11/11/22 X-rays demonstrate moderate degenerative changes to the [facility] joint   PATIENT SURVEYS:  01/21/23 FOTO intake: 66%   COGNITION: Overall cognitive status: WFL     SENSATION: WFL  POSTURE: Rounded head, forward shoulder, kyphosis  UPPER EXTREMITY ROM:   ROM  Right 01/21/23 Left 01/21/23  Shoulder flexion 105 150  Shoulder extension 30 40  Shoulder abduction 130 160  Shoulder adduction    Shoulder internal rotation 70 68  Shoulder external rotation 75 60  Elbow flexion    Elbow extension    (Blank rows = not tested)  UPPER EXTREMITY MMT:  MMT Right 01/21/23 Left 01/21/23  Shoulder flexion 4 5  Shoulder extension 4 5  Shoulder abduction    Shoulder adduction    Shoulder internal rotation 4 5  Shoulder external rotation 4 5  Middle trapezius    Lower trapezius    Elbow flexion    Grip strength (lbs)    (Blank rows = not tested)  SHOULDER SPECIAL TESTS: Impingement tests: Neer impingement test: negative     PALPATION:  TTP: Rt anterior GH joint, Rt supraspinatus tendon, right upper trap                                                                                                                                                                                                  TODAY'S TREATMENT:  DATE: 01/21/23 Therex:    HEP instruction/performance c cues for techniques, handout provided.  Trial set performed of each for comprehension and symptom assessment.  See below for exercise list   PATIENT EDUCATION: Education details: HEP, POC Person educated: Patient Education method: Explanation, Demonstration, Verbal cues, and Handouts Education comprehension: verbalized understanding, returned demonstration, and verbal cues required  HOME EXERCISE PROGRAM: Access Code: [email or web address] URL: [email or web address] Date: 01/21/2023 Prepared by: Narda Amber  Exercises - Supine Shoulder Flexion Extension AAROM with Dowel  - 2 x daily - 7 x weekly - 10 reps - 2-3 seconds hold - Supine Shoulder External Rotation with Dowel  - 2 x daily - 7 x weekly - 10 reps - 2-3 seconds hold - Supine Shoulder Abduction AAROM with  Dowel  - 2 x daily - 7 x weekly - 10 reps - 2-3 seconds hold - Standing Shoulder Row with Anchored Resistance  - 2 x daily - 7 x weekly - 2 sets - 10 reps - 3 seconds  hold  ASSESSMENT:  CLINICAL IMPRESSION: Patient is a 77 y.o. who comes to clinic with complaints of Rt shoulder pain s/p AC joint injection with initial relief but pt reporting pain has returned. Pt presents with mobility, strength and movement coordination deficits that impair their ability to perform usual daily and recreational functional activities without increase difficulty/symptoms at this time.  Patient to benefit from skilled PT services to address impairments and limitations to improve to previous level of function without restriction secondary to condition.   OBJECTIVE IMPAIRMENTS: decreased mobility, decreased ROM, decreased strength, impaired UE functional use, postural dysfunction, and pain.   ACTIVITY LIMITATIONS: lifting, sleeping, dressing, reach over head, hygiene/grooming, and caring for others  PARTICIPATION LIMITATIONS: cleaning, laundry, community activity, and occupation  PERSONAL FACTORS: 3+ comorbidities: see pertinent history are also affecting patient's functional outcome.   REHAB POTENTIAL: Good  CLINICAL DECISION MAKING: Stable/uncomplicated  EVALUATION COMPLEXITY: Low   GOALS: Goals reviewed with patient? Yes  SHORT TERM GOALS: (target date for Short term goals are 3 weeks 02/13/23)  1.Patient will demonstrate independent use of home exercise program to maintain progress from in clinic treatments. Goal status: New  LONG TERM GOALS: (target dates for all long term goals are 10 weeks  04/03/23 )   1. Patient will demonstrate/report pain at worst less than or equal to 2/10 to facilitate minimal limitation in daily activity secondary to pain symptoms. Goal status: New   2. Patient will demonstrate independent use of home exercise program to facilitate ability to maintain/progress functional  gains from skilled physical therapy services. Goal status: New   3. Patient will demonstrate FOTO outcome > or = 70 % to indicate reduced disability due to condition. Goal status: New   4.  Patient will demonstrate Rt UE MMT >/= 4/5 throughout to facilitate lifting, reaching, carrying at [facility] in daily activity.   Goal status: New   5.  Patient will be able to lift 10# using bil UE's from floor to counter height with no pain in her RT shoulder.    Goal status: New   6.  Pt will improve her Rt shoulder flexion to >= 150 degrees actively in order to improve functional mobility.  Goal status: New     PLAN:  PT FREQUENCY: 1-2x/week  PT DURATION: 10 weeks  PLANNED INTERVENTIONS: Therapeutic exercises, Therapeutic activity, Neuro Muscular re-education, Balance training, Gait training, Patient/Family education, Joint mobilization, Stair training, DME instructions, Dry Needling, Electrical stimulation, Traction, Cryotherapy, vasopneumatic device Moist  heat, Taping, Ultrasound, Ionotophoresis 4mg /ml Dexamethasone, and aquatic therapy ,Manual therapy.  All included unless contraindicated  PLAN FOR NEXT SESSION: Review HEP knowledge/results, shoulder ROM, shoulder strengthening as tolerated    Date of referral: 01/12/23 Referring provider: Madelyn Brunner Referring diagnosis?  M25.511,G89.29 (ICD-10-CM) - Chronic right shoulder pain  M19.011 (ICD-10-CM) - Arthritis of right acromioclavicular joint   Treatment diagnosis? (if different than referring diagnosis) M25.511, M62.82, M25.611  What was this (referring dx) caused by? Other: degenerative changes noted in imagining  Ashby Dawes of Condition: Initial Onset (within last 3 months)   Laterality: Rt  Current Functional Measure Score: FOTO 66%  Objective measurements identify impairments when they are compared to normal values, the uninvolved extremity, and prior level of function.  [x]  Yes  []  No  Objective assessment of functional  ability: Minimal functional limitations   Briefly describe symptoms: Rt shoulder pain and limited ROM  How did symptoms start: no specific trauma noted  Average pain intensity:  Last 24 hours: 4/10  Past week: pain ranges from 4-7/10  How often does the pt experience symptoms? Constantly  How much have the symptoms interfered with usual daily activities? Moderately  How has condition changed since care began at this facility? NA - initial visit  In general, how is the patients overall health? Good   BACK PAIN (STarT Back Screening Tool) No    Sharmon Leyden, PT, MPT 01/21/2023, 8:10 AM

## 2023-01-22 ENCOUNTER — Telehealth: Payer: Self-pay | Admitting: Sports Medicine

## 2023-01-22 DIAGNOSIS — H57813 Brow ptosis, bilateral: Secondary | ICD-10-CM | POA: Diagnosis not present

## 2023-01-22 DIAGNOSIS — H53483 Generalized contraction of visual field, bilateral: Secondary | ICD-10-CM | POA: Diagnosis not present

## 2023-01-22 DIAGNOSIS — H0279 Other degenerative disorders of eyelid and periocular area: Secondary | ICD-10-CM | POA: Diagnosis not present

## 2023-01-22 DIAGNOSIS — Z01818 Encounter for other preprocedural examination: Secondary | ICD-10-CM | POA: Diagnosis not present

## 2023-01-22 DIAGNOSIS — H02423 Myogenic ptosis of bilateral eyelids: Secondary | ICD-10-CM | POA: Diagnosis not present

## 2023-01-22 NOTE — Telephone Encounter (Signed)
Patient called and said she needed something for pain. She has pain in right shoulder that is causing pain to shoot up to her head. She asked can you send it to the [facility] on [address] [phone]

## 2023-01-23 ENCOUNTER — Encounter (INDEPENDENT_AMBULATORY_CARE_PROVIDER_SITE_OTHER): Payer: Medicare Other | Admitting: Ophthalmology

## 2023-01-23 MED ORDER — TRAMADOL HCL 50 MG PO TABS: 50.0000 mg | ORAL_TABLET | Freq: Two times a day (BID) | ORAL | 0 refills | Status: AC | PRN

## 2023-01-23 NOTE — Addendum Note (Signed)
Addended by: Jamie Kato III on: 01/23/2023 08:27 AM   Modules accepted: Orders

## 2023-01-26 ENCOUNTER — Ambulatory Visit: Payer: Medicare Other | Admitting: Physical Therapy

## 2023-01-26 ENCOUNTER — Encounter (INDEPENDENT_AMBULATORY_CARE_PROVIDER_SITE_OTHER): Payer: Medicare Other | Admitting: Ophthalmology

## 2023-01-26 ENCOUNTER — Encounter: Payer: Self-pay | Admitting: Physical Therapy

## 2023-01-26 DIAGNOSIS — M6281 Muscle weakness (generalized): Secondary | ICD-10-CM

## 2023-01-26 DIAGNOSIS — M25511 Pain in right shoulder: Secondary | ICD-10-CM | POA: Diagnosis not present

## 2023-01-26 DIAGNOSIS — M25611 Stiffness of right shoulder, not elsewhere classified: Secondary | ICD-10-CM | POA: Diagnosis not present

## 2023-01-26 DIAGNOSIS — G8929 Other chronic pain: Secondary | ICD-10-CM

## 2023-01-26 NOTE — Therapy (Addendum)
OUTPATIENT PHYSICAL THERAPY SHOULDER   Patient Name: Toni Rich MRN: [id number] DOB:09-Dec-1945, 77 y.o., female Today's Date: 01/26/2023  END OF SESSION:     PT End of Session - 01/26/23 1447     Visit Number 2   Number of Visits 20    Date for PT Re-Evaluation 04/03/23    PT Start Time 1406    PT Stop Time 1450    PT Time Calculation (min) 44 min    Activity Tolerance Patient tolerated treatment well    Behavior During Therapy [facility] for tasks assessed/performed             Past Medical History:  Diagnosis Date   Anxiety    Colonic polyp    Depression    Hypercholesteremia    Hypothyroid    IBS (irritable bowel syndrome)    Migraines    Palpitations    Peripheral neuropathy    Past Surgical History:  Procedure Laterality Date   CESAREAN SECTION     TUBAL LIGATION     Patient Active Problem List   Diagnosis Date Noted   Coronary artery disease involving native coronary artery of native heart with angina pectoris (HCC) 03/25/2021   Mixed hyperlipidemia 03/25/2021   Right thigh pain 03/25/2019   Right knee pain 10/28/2018   Primary osteoarthritis of first carpometacarpal joint of left hand 10/28/2018   Hypokalemia 12/17/2011   Bronchitis 12/17/2011   Bilateral atelectasis 12/15/2011   Colitis 12/13/2011   Leukocytosis 12/13/2011    PCP: Sigmund Hazel, MD   REFERRING PROVIDER: Madelyn Brunner, DO   REFERRING DIAG:  Diagnosis  M25.511,G89.29 (ICD-10-CM) - Chronic right shoulder pain  M19.011 (ICD-10-CM) - Arthritis of right acromioclavicular joint    THERAPY DIAG:  Chronic right shoulder pain  Muscle weakness (generalized)  Stiffness of right shoulder, not elsewhere classified  Rationale for Evaluation and Treatment: Rehabilitation  ONSET DATE:   SUBJECTIVE:                                                                                                                                                                                       SUBJECTIVE STATEMENT: Pt arriving today reporting 5/10 pain in her Rt shoulder and neck. Pt stating her HEP has been going, "good".   PERTINENT HISTORY: She did undergo ultrasound-guided right AC joint injection on 11/11/2022, and then subsequent GHJ injection on 12/10/22.  Her left shoulder pain and biceps related pain essentially resolved after glenohumeral joint injection.  Her motion is improved as well but she is still having pain over the top of the shoulder, this has worsened over the last few weeks.  She did receive relief  after the [facility] joint injection but this has returned unfortunately.  She was taking Tylenol, was also prescribed tramadol 50 mg to take twice daily as needed.  PMH: anxiety, depression, IBS, peripheral neuropathy, migraines, hypothyroid, C-section, tubal ligation  PAIN:  NPRS scale: 5/10 Pain location: Rt shoulder, anterior mostly Pain description: throbbing Aggravating factors: sleeping Relieving factors: tylenol  PRECAUTIONS: None  WEIGHT BEARING RESTRICTIONS: No  FALLS:  Has patient fallen in last 6 months? 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  PLOF: Independent  PATIENT GOALS:Stop hurting  Next MD visit:   OBJECTIVE:   DIAGNOSTIC FINDINGS: 11/11/22 X-rays demonstrate moderate degenerative changes to the [facility] joint   PATIENT SURVEYS:  01/21/23 FOTO intake: 66%   COGNITION: Overall cognitive status: WFL     SENSATION: WFL  POSTURE: Rounded head, forward shoulder, kyphosis  UPPER EXTREMITY ROM:   ROM Right 01/21/23 Left 01/21/23  Shoulder flexion 105 150  Shoulder extension 30 40  Shoulder abduction 130 160  Shoulder adduction    Shoulder internal rotation 70 68  Shoulder  external rotation 75 60  Elbow flexion    Elbow extension    (Blank rows = not tested)  UPPER EXTREMITY MMT:  MMT Right 01/21/23 Left 01/21/23  Shoulder flexion 4 5  Shoulder extension 4 5  Shoulder abduction    Shoulder adduction    Shoulder internal rotation 4 5  Shoulder external rotation 4 5  Middle trapezius    Lower trapezius    Elbow flexion    Grip strength (lbs)    (Blank rows = not tested)  SHOULDER SPECIAL TESTS: Impingement tests: Neer impingement test: negative   PALPATION:  TTP: Rt anterior GH joint, Rt supraspinatus tendon, right upper trap                                                                                                                                                                                                     TODAY'S TREATMENT:  DATE: 01/26/23:  TherEx:  Pulleys flexion: x 3 minutes Rolling yellow physio-ball up and down the wall x 10  Supine shoulder flexion c 1# bar Seated upper trap stretch x 4 holding 10 sec Seated levator scapulae stretch x 4 holding 10 sec bilaterally  Manual Trigger Point Dry-Needling  Treatment instructions: Expect mild to moderate muscle soreness. S/S of pneumothorax if dry needled over a lung field, and to seek immediate medical attention should they occur. Patient verbalized understanding of these instructions and education.  Patient Consent Given: Yes Education handout provided: No Muscles treated: Rt upper trap and Rt cervical paraspinals, suboccipitals Electrical stimulation performed: No Parameters: N/A Treatment response/outcome: twitch response noted, pt with good response to treatment Moist heat placed on pt's cervical spine and Rt shoulder x 6 minutes    01/21/23 Therex:    HEP instruction/performance c cues for techniques, handout provided.  Trial set performed of each for comprehension  and symptom assessment.  See below for exercise list   PATIENT EDUCATION: Education details: HEP, POC Person educated: Patient Education method: Explanation, Demonstration, Verbal cues, and Handouts Education comprehension: verbalized understanding, returned demonstration, and verbal cues required  HOME EXERCISE PROGRAM: Access Code: [email or web address] URL: [email or web address] Date: 01/21/2023 Prepared by: Narda Amber  Exercises - Supine Shoulder Flexion Extension AAROM with Dowel  - 2 x daily - 7 x weekly - 10 reps - 2-3 seconds hold - Supine Shoulder External Rotation with Dowel  - 2 x daily - 7 x weekly - 10 reps - 2-3 seconds hold - Supine Shoulder Abduction AAROM with Dowel  - 2 x daily - 7 x weekly - 10 reps - 2-3 seconds hold - Standing Shoulder Row with Anchored Resistance  - 2 x daily - 7 x weekly - 2 sets - 10 reps - 3 seconds  hold  ASSESSMENT:  CLINICAL IMPRESSION: Pt arriving today reporting 5/10 pain in her cervical spine. Pt able to tolerate exercises well. DN performed with multiple responses noted and good tolerance. Continue skilled PT to maximize pt's function.   OBJECTIVE IMPAIRMENTS: decreased mobility, decreased ROM, decreased strength, impaired UE functional use, postural dysfunction, and pain.   ACTIVITY LIMITATIONS: lifting, sleeping, dressing, reach over head, hygiene/grooming, and caring for others  PARTICIPATION LIMITATIONS: cleaning, laundry, community activity, and occupation  PERSONAL FACTORS: 3+ comorbidities: see pertinent history  are also affecting patient's functional outcome.   REHAB POTENTIAL: Good  CLINICAL DECISION MAKING: Stable/uncomplicated  EVALUATION COMPLEXITY: Low   GOALS: Goals reviewed with patient? Yes  SHORT TERM GOALS: (target date for Short term goals are 3 weeks 02/13/23)  1.Patient will demonstrate independent use of home exercise program to maintain progress from in clinic treatments. Goal status:  New  LONG TERM GOALS: (target dates for all long term goals are 10 weeks  04/03/23 )   1. Patient will demonstrate/report pain at worst less than or equal to 2/10 to facilitate minimal limitation in daily activity secondary to pain symptoms. Goal status: New   2. Patient will demonstrate independent use of home exercise program to facilitate ability to maintain/progress functional gains from skilled physical therapy services. Goal status: New   3. Patient will demonstrate FOTO outcome > or = 70 % to indicate reduced disability due to condition. Goal status: New   4.  Patient will demonstrate Rt UE MMT >/= 4/5 throughout to facilitate lifting, reaching, carrying at [facility] in daily activity.   Goal status: New   5.  Patient will be able to lift 10# using  bil UE's from floor to counter height with no pain in her RT shoulder.    Goal status: New   6.  Pt will improve her Rt shoulder flexion to >= 150 degrees actively in order to improve functional mobility.  Goal status: New     PLAN:  PT FREQUENCY: 1-2x/week  PT DURATION: 10 weeks  PLANNED INTERVENTIONS: Therapeutic exercises, Therapeutic activity, Neuro Muscular re-education, Balance training, Gait training, Patient/Family education, Joint mobilization, Stair training, DME instructions, Dry Needling, Electrical stimulation, Traction, Cryotherapy, vasopneumatic device Moist heat, Taping, Ultrasound, Ionotophoresis 4mg /ml Dexamethasone, and aquatic therapy ,Manual therapy.  All included unless contraindicated  PLAN FOR NEXT SESSION: Review HEP knowledge/results, shoulder ROM, shoulder strengthening as tolerated    Date of referral: 01/12/23 Referring provider: Madelyn Brunner Referring diagnosis?  M25.511,G89.29 (ICD-10-CM) - Chronic right shoulder pain  M19.011 (ICD-10-CM) - Arthritis of right acromioclavicular joint   Treatment diagnosis? (if different than referring diagnosis) M25.511, M62.82, M25.611  What was this (referring  dx) caused by? Other: degenerative changes noted in imagining  Ashby Dawes of Condition: Initial Onset (within last 3 months)   Laterality: Rt  Current Functional Measure Score: FOTO 66%  Objective measurements identify impairments when they are compared to normal values, the uninvolved extremity, and prior level of function.  [x]  Yes  []  No  Objective assessment of functional ability: Minimal functional limitations   Briefly describe symptoms: Rt shoulder pain and limited ROM  How did symptoms start: no specific trauma noted  Average pain intensity:  Last 24 hours: 4/10  Past week: pain ranges from 4-7/10  How often does the pt experience symptoms? Constantly  How much have the symptoms interfered with usual daily activities? Moderately  How has condition changed since care began at this facility? NA - initial visit  In general, how is the patients overall health? Good   BACK PAIN (STarT Back Screening Tool) No    Sharmon Leyden, PT, MPT 01/26/2023, 3:10 PM

## 2023-01-27 ENCOUNTER — Encounter: Payer: Medicare Other | Admitting: Physical Therapy

## 2023-02-02 ENCOUNTER — Telehealth: Payer: Self-pay | Admitting: Physical Therapy

## 2023-02-02 NOTE — Telephone Encounter (Signed)
I returned pt's call about her HEP. Pt stating she woke up with a horrible headache and pt wanted to know if she needed to try to work though her pain and continue her HEP. Pt was advised to hold her HEP for today and continue once her headache subsided. Pt was aware of her scheduled PT visit on 02/03/23.  Narda Amber, PT, MPT 02/02/23 1:24 PM

## 2023-02-03 ENCOUNTER — Encounter (INDEPENDENT_AMBULATORY_CARE_PROVIDER_SITE_OTHER): Payer: Medicare Other | Admitting: Ophthalmology

## 2023-02-03 ENCOUNTER — Encounter: Payer: Self-pay | Admitting: Physical Therapy

## 2023-02-03 ENCOUNTER — Ambulatory Visit: Payer: Medicare Other | Admitting: Physical Therapy

## 2023-02-03 DIAGNOSIS — H43813 Vitreous degeneration, bilateral: Secondary | ICD-10-CM | POA: Diagnosis not present

## 2023-02-03 DIAGNOSIS — G8929 Other chronic pain: Secondary | ICD-10-CM

## 2023-02-03 DIAGNOSIS — M25611 Stiffness of right shoulder, not elsewhere classified: Secondary | ICD-10-CM | POA: Diagnosis not present

## 2023-02-03 DIAGNOSIS — M6281 Muscle weakness (generalized): Secondary | ICD-10-CM

## 2023-02-03 DIAGNOSIS — M25511 Pain in right shoulder: Secondary | ICD-10-CM

## 2023-02-03 DIAGNOSIS — H353132 Nonexudative age-related macular degeneration, bilateral, intermediate dry stage: Secondary | ICD-10-CM

## 2023-02-03 NOTE — Therapy (Signed)
OUTPATIENT PHYSICAL THERAPY SHOULDER   Patient Name: Glenadine Driggers MRN: [id number] DOB:03-12-1946, 77 y.o., female Today's Date: 02/03/2023  END OF SESSION:  PT End of Session - 02/03/23 1108     Visit Number 3    Number of Visits 20    Date for PT Re-Evaluation 04/03/23   approved up to 03/18/23   Authorization Type [employer] approved 16 visits up to 03/18/23    Authorization - Number of Visits 16    Progress Note Due on Visit 10    PT Start Time 1115    PT Stop Time 1200    PT Time Calculation (min) 45 min    Activity Tolerance Patient tolerated treatment well    Behavior During Therapy [facility] for tasks assessed/performed              Past Medical History:  Diagnosis Date   Anxiety    Colonic polyp    Depression    Hypercholesteremia    Hypothyroid    IBS (irritable bowel syndrome)    Migraines    Palpitations    Peripheral neuropathy    Past Surgical History:  Procedure Laterality Date   CESAREAN SECTION     TUBAL LIGATION     Patient Active Problem List   Diagnosis Date Noted   Coronary artery disease involving native coronary artery of native heart with angina pectoris (HCC) 03/25/2021   Mixed hyperlipidemia 03/25/2021   Right thigh pain 03/25/2019   Right knee pain 10/28/2018   Primary osteoarthritis of first carpometacarpal joint of left hand 10/28/2018   Hypokalemia 12/17/2011   Bronchitis 12/17/2011   Bilateral atelectasis 12/15/2011   Colitis 12/13/2011   Leukocytosis 12/13/2011    PCP: Sigmund Hazel, MD   REFERRING PROVIDER: Madelyn Brunner, DO   REFERRING DIAG:  Diagnosis  M25.511,G89.29 (ICD-10-CM) - Chronic right shoulder pain  M19.011 (ICD-10-CM) - Arthritis of right acromioclavicular joint    THERAPY DIAG:  Chronic right shoulder pain  Muscle weakness (generalized)  Stiffness of right shoulder, not elsewhere classified  Rationale for Evaluation and Treatment: Rehabilitation  ONSET DATE:   SUBJECTIVE:                                                                                                                                                                                       SUBJECTIVE STATEMENT: Pt arriving today reporting today is much better day than yesterday. Pt reporting migraine yesterday and stated she was unable to perform her HEP. Pt reporting the DN seemed to help following her last visit and wishes to try it again.     PERTINENT HISTORY: She did undergo ultrasound-guided right AC joint  injection on 11/11/2022, and then subsequent GHJ injection on 12/10/22.  Her left shoulder pain and biceps related pain essentially resolved after glenohumeral joint injection.  Her motion is improved as well but she is still having pain over the top of the shoulder, this has worsened over the last few weeks.  She did receive relief after the [facility] joint injection but this has returned unfortunately.  She was taking Tylenol, was also prescribed tramadol 50 mg to take twice daily as needed.  PMH: anxiety, depression, IBS, peripheral neuropathy, migraines, hypothyroid, C-section, tubal ligation  PAIN:  NPRS scale:  Pain location: Rt shoulder, anterior mostly Pain description: throbbing Aggravating factors: sleeping Relieving factors: tylenol  PRECAUTIONS: None  WEIGHT BEARING RESTRICTIONS: No  FALLS:  Has patient fallen in last 6 months? 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  PLOF: Independent  PATIENT GOALS:Stop hurting  Next MD visit:   OBJECTIVE:   DIAGNOSTIC FINDINGS: 11/11/22 X-rays demonstrate moderate degenerative changes to the [facility] joint   PATIENT SURVEYS:  01/21/23 FOTO intake: 66%   COGNITION: Overall cognitive status:  WFL     SENSATION: WFL  POSTURE: Rounded head, forward shoulder, kyphosis  UPPER EXTREMITY ROM:   ROM Right 01/21/23 Left 01/21/23  Shoulder flexion 105 150  Shoulder extension 30 40  Shoulder abduction 130 160  Shoulder adduction    Shoulder internal rotation 70 68  Shoulder external rotation 75 60  Elbow flexion    Elbow extension    (Blank rows = not tested)  UPPER EXTREMITY MMT:  MMT Right 01/21/23 Left 01/21/23  Shoulder flexion 4 5  Shoulder extension 4 5  Shoulder abduction    Shoulder adduction    Shoulder internal rotation 4 5  Shoulder external rotation 4 5  Middle trapezius    Lower trapezius    Elbow flexion    Grip strength (lbs)    (Blank rows = not tested)  SHOULDER SPECIAL TESTS: Impingement tests: Neer impingement test: negative   PALPATION:  TTP: Rt anterior GH joint, Rt supraspinatus tendon, right upper trap                                                                                                                                                                                                   TODAY'S TREATMENT:  DATE: 02/03/23:  TherEx:  UBE: 3 minutes each direction, Level 2 Standing scapular stabilization 2# ball against the wall circles both directions x 10 bil UEs Standing wall posture c cervical retraction x 10 holding 5 sec Standing  shoulder flexion c 1# bar x 10  Standing shoulder extension red TB 2 x 10  Standing Rows: red TB 2 x 10  Seated upper trap stretch x 4 holding 10 sec Seated levator scapulae stretch x 4 holding 10 sec bilaterally Manual Trigger Point Dry-Needling  Treatment instructions: Expect mild to moderate muscle soreness. S/S of pneumothorax if dry needled over a lung field, and to seek immediate medical attention should they occur. Patient verbalized understanding of these instructions and  education.  Patient Consent Given: Yes Education handout provided: No Muscles treated:  and bil cervical paraspinals, bil suboccipitals Electrical stimulation performed: No Parameters: N/A Treatment response/outcome: twitch response noted, pt with good response to treatment Moist heat placed on pt's cervical spine and Rt shoulder x 5 minutes     01/26/23:  TherEx:  Pulleys flexion: x 3 minutes Rolling yellow physio-ball up and down the wall x 10  Supine shoulder flexion c 1# bar Seated upper trap stretch x 4 holding 10 sec Seated levator scapulae stretch x 4 holding 10 sec bilaterally  Manual Trigger Point Dry-Needling  Treatment instructions: Expect mild to moderate muscle soreness. S/S of pneumothorax if dry needled over a lung field, and to seek immediate medical attention should they occur. Patient verbalized understanding of these instructions and education.  Patient Consent Given: Yes Education handout provided: No Muscles treated: Rt upper trap and Rt cervical paraspinals, suboccipitals Electrical stimulation performed: No Parameters: N/A Treatment response/outcome: twitch response noted, pt with good response to treatment Moist heat placed on pt's cervical spine and Rt shoulder x 6 minutes    01/21/23 Therex:    HEP instruction/performance c cues for techniques, handout provided.  Trial set performed of each for comprehension and symptom assessment.  See below for exercise list   PATIENT EDUCATION: Education details: HEP, POC Person educated: Patient Education method: Explanation, Demonstration, Verbal cues, and Handouts Education comprehension: verbalized understanding, returned demonstration, and verbal cues required  HOME EXERCISE PROGRAM: Access Code: [email or web address] URL: [email or web address] Date: 01/21/2023 Prepared by: Narda Amber  Exercises - Supine Shoulder Flexion Extension AAROM with Dowel  - 2 x daily - 7 x weekly - 10 reps - 2-3  seconds hold - Supine Shoulder External Rotation with Dowel  - 2 x daily - 7 x weekly - 10 reps - 2-3 seconds hold - Supine Shoulder Abduction AAROM with Dowel  - 2 x daily - 7 x weekly - 10 reps - 2-3 seconds hold - Standing Shoulder Row with Anchored Resistance  - 2 x daily - 7 x weekly - 2 sets - 10 reps - 3 seconds  hold  ASSESSMENT:  CLINICAL IMPRESSION: Pt arriving to therapy today reporting 1-2/10 pain in her neck. Pt stating she wished to try the DN again due to relief following our last treatment. Pt tolerating all exercises well and relief with manual therpay. Pt with no pain reported at end of session. Recommend continued skilled PT interventions.   OBJECTIVE IMPAIRMENTS: decreased mobility, decreased ROM, decreased strength, impaired UE functional use, postural dysfunction, and pain.   ACTIVITY LIMITATIONS: lifting, sleeping, dressing, reach over head, hygiene/grooming, and caring for others  PARTICIPATION LIMITATIONS: cleaning, laundry, community activity, and occupation  PERSONAL FACTORS: 3+ comorbidities: see pertinent history  are also affecting patient's  functional outcome.   REHAB POTENTIAL: Good  CLINICAL DECISION MAKING: Stable/uncomplicated  EVALUATION COMPLEXITY: Low   GOALS: Goals reviewed with patient? Yes  SHORT TERM GOALS: (target date for Short term goals are 3 weeks 02/13/23)  1.Patient will demonstrate independent use of home exercise program to maintain progress from in clinic treatments. Goal status:  on-going 02/03/23  LONG TERM GOALS: (target dates for all long term goals are 10 weeks  04/03/23 )   1. Patient will demonstrate/report pain at worst less than or equal to 2/10 to facilitate minimal limitation in daily activity secondary to pain symptoms. Goal status: on-going 02/03/23   2. Patient will demonstrate independent use of home exercise program to facilitate ability to maintain/progress functional gains from skilled physical therapy  services. Goal status: New   3. Patient will demonstrate FOTO outcome > or = 70 % to indicate reduced disability due to condition. Goal status: New   4.  Patient will demonstrate Rt UE MMT >/= 4/5 throughout to facilitate lifting, reaching, carrying at [facility] in daily activity.   Goal status: New   5.  Patient will be able to lift 10# using bil UE's from floor to counter height with no pain in her RT shoulder.    Goal status: New   6.  Pt will improve her Rt shoulder flexion to >= 150 degrees actively in order to improve functional mobility.  Goal status: New     PLAN:  PT FREQUENCY: 1-2x/week  PT DURATION: 10 weeks  PLANNED INTERVENTIONS: Therapeutic exercises, Therapeutic activity, Neuro Muscular re-education, Balance training, Gait training, Patient/Family education, Joint mobilization, Stair training, DME instructions, Dry Needling, Electrical stimulation, Traction, Cryotherapy, vasopneumatic device Moist heat, Taping, Ultrasound, Ionotophoresis 4mg /ml Dexamethasone, and aquatic therapy ,Manual therapy.  All included unless contraindicated  PLAN FOR NEXT SESSION: Review HEP knowledge/results, shoulder ROM, shoulder strengthening as tolerated   Narda Amber, PT, MPT 02/03/23 12:05 PM       Date of referral: 01/12/23 Referring provider: Madelyn Brunner Referring diagnosis?  M25.511,G89.29 (ICD-10-CM) - Chronic right shoulder pain  M19.011 (ICD-10-CM) - Arthritis of right acromioclavicular joint   Treatment diagnosis? (if different than referring diagnosis) M25.511, M62.82, M25.611  What was this (referring dx) caused by? Other: degenerative changes noted in imagining  Ashby Dawes of Condition: Initial Onset (within last 3 months)   Laterality: Rt  Current Functional Measure Score: FOTO 66%  Objective measurements identify impairments when they are compared to normal values, the uninvolved extremity, and prior level of function.  [x]  Yes  []  No  Objective assessment  of functional ability: Minimal functional limitations   Briefly describe symptoms: Rt shoulder pain and limited ROM  How did symptoms start: no specific trauma noted  Average pain intensity:  Last 24 hours: 4/10  Past week: pain ranges from 4-7/10  How often does the pt experience symptoms? Constantly  How much have the symptoms interfered with usual daily activities? Moderately  How has condition changed since care began at this facility? NA - initial visit  In general, how is the patients overall health? Good   BACK PAIN (STarT Back Screening Tool) No

## 2023-02-04 DIAGNOSIS — R350 Frequency of micturition: Secondary | ICD-10-CM | POA: Diagnosis not present

## 2023-02-07 ENCOUNTER — Other Ambulatory Visit: Payer: Self-pay | Admitting: Cardiology

## 2023-02-09 ENCOUNTER — Encounter: Payer: Self-pay | Admitting: Physical Therapy

## 2023-02-09 ENCOUNTER — Encounter: Payer: Self-pay | Admitting: Sports Medicine

## 2023-02-09 ENCOUNTER — Ambulatory Visit: Payer: Medicare Other | Admitting: Physical Therapy

## 2023-02-09 ENCOUNTER — Ambulatory Visit (INDEPENDENT_AMBULATORY_CARE_PROVIDER_SITE_OTHER): Payer: Medicare Other | Admitting: Sports Medicine

## 2023-02-09 DIAGNOSIS — M25611 Stiffness of right shoulder, not elsewhere classified: Secondary | ICD-10-CM | POA: Diagnosis not present

## 2023-02-09 DIAGNOSIS — M542 Cervicalgia: Secondary | ICD-10-CM | POA: Diagnosis not present

## 2023-02-09 DIAGNOSIS — G8929 Other chronic pain: Secondary | ICD-10-CM | POA: Diagnosis not present

## 2023-02-09 DIAGNOSIS — M25511 Pain in right shoulder: Secondary | ICD-10-CM | POA: Diagnosis not present

## 2023-02-09 DIAGNOSIS — M19011 Primary osteoarthritis, right shoulder: Secondary | ICD-10-CM | POA: Diagnosis not present

## 2023-02-09 DIAGNOSIS — M6281 Muscle weakness (generalized): Secondary | ICD-10-CM | POA: Diagnosis not present

## 2023-02-09 MED ORDER — PREDNISONE 10 MG PO TABS: ORAL_TABLET | ORAL | 0 refills | Status: DC

## 2023-02-09 NOTE — Progress Notes (Signed)
Toni Rich - 77 y.o. female MRN [id number]  Date of birth: [birth year]  Office Visit Note: Visit Date: 02/09/2023 PCP: Sigmund Hazel, MD Referred by: Sigmund Hazel, MD  Subjective: Chief Complaint  Patient presents with   Right Shoulder - Follow-up   HPI: Toni Rich is a pleasant 77 y.o. female who presents today for follow-up of chronic right shoulder pain.  Her right shoulder pain is doing better.  No longer has pain in the back part of the shoulder.  Still has some mild pain on the top of her right shoulder but this is improved from previous visits.  She does think physical therapy is helping.  She has had some trapezius and neck tissue pain which has responded well to formalized physical therapy and dry needling.  Previous treatment:  R-AC joint injection on 11/11/22, GHJ on 12/10/22 Tramadol as needed for breakthrough pain  Pertinent ROS were reviewed with the patient and found to be negative unless otherwise specified above in HPI.   Assessment & Plan: Visit Diagnoses:  1. Chronic right shoulder pain   2. Arthritis of right acromioclavicular joint   3. Neck pain on right side    Plan: Discussed with Iyunna the nature of her chronic right shoulder pain which provocatively seems to be emanating more so from her [facility] joint today.  It is certainly improved from prior injection as well as formalized physical therapy.  He does have some reciprocal trapezius and cervical paraspinal hypertonicity but this has also gotten better with formalized physical therapy and dry needling.  We discussed for medication therapy, continue PT, possible injection therapy.  We will start with a short course of prednisone 20 mg x 5 days, followed by 10 mg x 5 days.  Will continue her PT.  She will message me in a few weeks to see if she continues having further improvement.  If she is still having shoulder pain over the [facility] joint, we could consider ultrasound-guided AC joint injection in the future, but  will hold for now.  Follow-up: Return for will message me in 3-4 weeks to give update on shoulder.   Meds & Orders:  Meds ordered this encounter  Medications   predniSONE (DELTASONE) 10 MG tablet    Sig: Take 2 tablets (20mg  total) once daily for 5 days, then take 1 tablet (10mg ) once daily for 5 additional days    Dispense:  15 tablet    Refill:  0   No orders of the defined types were placed in this encounter.    Procedures: No procedures performed      Clinical History: No specialty comments available.  She reports that she has quit smoking. She has never used smokeless tobacco. No results for input(s): "HGBA1C", "LABURIC" in the last 8760 hours.  Objective:    Physical Exam  Gen: Well-appearing, in no acute distress; non-toxic CV: Well-perfused. Warm.  Resp: Breathing unlabored on room air; no wheezing. Psych: Fluid speech in conversation; appropriate affect; normal thought process Neuro: Sensation intact throughout. No gross coordination deficits.   Ortho Exam - Right shoulder/Neck: + Mild TTP over the [facility] joint.  There is good active and passive range of motion of the shoulder in all directions.  Positive scarf's test.  Good strength with rotator cuff testing.  There is some mild hypertonicity of the right trapezius and cervical paraspinals.  Full range of motion about the neck.  Imaging:  *Independent review and interpretation of 4 views right shoulder from 11/11/2022  was performed by myself today.  X-rays demonstrate moderate degenerative changes of the [facility] joint.  There is a small spur off the inferior aspect of the humeral head but no significant glenohumeral joint arthritis.  No acute fracture noted.   Past Medical/Family/Surgical/Social History: Medications & Allergies reviewed per EMR, new medications updated. Patient Active Problem List   Diagnosis Date Noted   Coronary artery disease involving native coronary artery of native heart with angina pectoris (HCC)  03/25/2021   Mixed hyperlipidemia 03/25/2021   Right thigh pain 03/25/2019   Right knee pain 10/28/2018   Primary osteoarthritis of first carpometacarpal joint of left hand 10/28/2018   Hypokalemia 12/17/2011   Bronchitis 12/17/2011   Bilateral atelectasis 12/15/2011   Colitis 12/13/2011   Leukocytosis 12/13/2011   Past Medical History:  Diagnosis Date   Anxiety    Colonic polyp    Depression    Hypercholesteremia    Hypothyroid    IBS (irritable bowel syndrome)    Migraines    Palpitations    Peripheral neuropathy    Family History  Problem Relation Age of Onset   Heart attack Sister    Past Surgical History:  Procedure Laterality Date   CESAREAN SECTION     TUBAL LIGATION     Social History   Occupational History   Not on file  Tobacco Use   Smoking status: Former   Smokeless tobacco: Never  Vaping Use   Vaping status: Never Used  Substance and Sexual Activity   Alcohol use: No   Drug use: No   Sexual activity: Not Currently

## 2023-02-09 NOTE — Progress Notes (Signed)
Patient says that physical therapy is helping

## 2023-02-09 NOTE — Therapy (Signed)
OUTPATIENT PHYSICAL THERAPY SHOULDER   Patient Name: Toni Rich MRN: [id number] DOB:1945-12-26, 77 y.o., female Today's Date: 02/09/2023  END OF SESSION:  PT End of Session - 02/09/23 1345     Visit Number 4    Number of Visits 20    Date for PT Re-Evaluation 04/03/23    Authorization Type [employer] approved 16 visits up to 03/18/23    Authorization - Number of Visits 16    Progress Note Due on Visit 10    PT Start Time 1346    PT Stop Time 1425    PT Time Calculation (min) 39 min    Activity Tolerance Patient tolerated treatment well    Behavior During Therapy WFL for tasks assessed/performed              Past Medical History:  Diagnosis Date   Anxiety    Colonic polyp    Depression    Hypercholesteremia    Hypothyroid    IBS (irritable bowel syndrome)    Migraines    Palpitations    Peripheral neuropathy    Past Surgical History:  Procedure Laterality Date   CESAREAN SECTION     TUBAL LIGATION     Patient Active Problem List   Diagnosis Date Noted   Coronary artery disease involving native coronary artery of native heart with angina pectoris (HCC) 03/25/2021   Mixed hyperlipidemia 03/25/2021   Right thigh pain 03/25/2019   Right knee pain 10/28/2018   Primary osteoarthritis of first carpometacarpal joint of left hand 10/28/2018   Hypokalemia 12/17/2011   Bronchitis 12/17/2011   Bilateral atelectasis 12/15/2011   Colitis 12/13/2011   Leukocytosis 12/13/2011    PCP: Sigmund Hazel, MD   REFERRING PROVIDER: Madelyn Brunner, DO   REFERRING DIAG:  Diagnosis  M25.511,G89.29 (ICD-10-CM) - Chronic right shoulder pain  M19.011 (ICD-10-CM) - Arthritis of right acromioclavicular joint    THERAPY DIAG:  Chronic right shoulder pain  Muscle weakness (generalized)  Stiffness of right shoulder, not elsewhere classified  Rationale for Evaluation and Treatment: Rehabilitation  ONSET DATE:   SUBJECTIVE:                                                                                                                                                                                       SUBJECTIVE STATEMENT: Pt reporting great response following our last session, but her pain return two days later.     PERTINENT HISTORY: She did undergo ultrasound-guided right AC joint injection on 11/11/2022, and then subsequent GHJ injection on 12/10/22.  Her left shoulder pain and biceps related pain essentially resolved after glenohumeral joint injection.  Her motion is improved as  well but she is still having pain over the top of the shoulder, this has worsened over the last few weeks.  She did receive relief after the [facility] joint injection but this has returned unfortunately.  She was taking Tylenol, was also prescribed tramadol 50 mg to take twice daily as needed.  PMH: anxiety, depression, IBS, peripheral neuropathy, migraines, hypothyroid, C-section, tubal ligation  PAIN:  NPRS scale: 6/10 upon arrival Pain location: Rt shoulder, anterior mostly Pain description: throbbing Aggravating factors: sleeping Relieving factors: tylenol  PRECAUTIONS: None  WEIGHT BEARING RESTRICTIONS: No  FALLS:  Has patient fallen in last 6 months? 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  PLOF: Independent  PATIENT GOALS:Stop hurting  Next MD visit:   OBJECTIVE:   DIAGNOSTIC FINDINGS: 11/11/22 X-rays demonstrate moderate degenerative changes to the [facility] joint   PATIENT SURVEYS:  01/21/23 FOTO intake: 66%   COGNITION: Overall cognitive status: WFL     SENSATION: WFL  POSTURE: Rounded head, forward shoulder, kyphosis  UPPER EXTREMITY ROM:   ROM Right 01/21/23 Left 01/21/23  Shoulder flexion 105 150   Shoulder extension 30 40  Shoulder abduction 130 160  Shoulder adduction    Shoulder internal rotation 70 68  Shoulder external rotation 75 60  Elbow flexion    Elbow extension    (Blank rows = not tested)  UPPER EXTREMITY MMT:  MMT Right 01/21/23 Left 01/21/23  Shoulder flexion 4 5  Shoulder extension 4 5  Shoulder abduction    Shoulder adduction    Shoulder internal rotation 4 5  Shoulder external rotation 4 5  Middle trapezius    Lower trapezius    Elbow flexion    Grip strength (lbs)    (Blank rows = not tested)  SHOULDER SPECIAL TESTS: Impingement tests: Neer impingement test: negative   PALPATION:  TTP: Rt anterior GH joint, Rt supraspinatus tendon, right upper trap                                                                                                                                                                                                   TODAY'S TREATMENT:  DATE: 02/09/23:  TherEx:  Pulleys: flexion, scaption x 3 minutes each Rows: x 15 green TB, holding 3 sec Cervical retraction: x 5 holding 10 sec Traction:  Mechanical Traction: x 20 minutes Max pull: 12 # Min pull: 7# Modalities:  Moist heat: x 5 minutes    02/03/23:  TherEx:  UBE: 3 minutes each direction, Level 2 Standing scapular stabilization 2# ball against the wall circles both directions x 10 bil UEs Standing wall posture c cervical retraction x 10 holding 5 sec Standing  shoulder flexion c 1# bar x 10  Standing shoulder extension red TB 2 x 10  Standing Rows: red TB 2 x 10  Seated upper trap stretch x 4 holding 10 sec Seated levator scapulae stretch x 4 holding 10 sec bilaterally Manual Trigger Point Dry-Needling  Treatment instructions: Expect mild to moderate muscle soreness. S/S of pneumothorax if dry needled over a lung field, and to seek immediate medical  attention should they occur. Patient verbalized understanding of these instructions and education.  Patient Consent Given: Yes Education handout provided: No Muscles treated:  and bil cervical paraspinals, bil suboccipitals Electrical stimulation performed: No Parameters: N/A Treatment response/outcome: twitch response noted, pt with good response to treatment Moist heat placed on pt's cervical spine and Rt shoulder x 5 minutes     01/26/23:  TherEx:  Pulleys flexion: x 3 minutes Rolling yellow physio-ball up and down the wall x 10  Supine shoulder flexion c 1# bar Seated upper trap stretch x 4 holding 10 sec Seated levator scapulae stretch x 4 holding 10 sec bilaterally  Manual Trigger Point Dry-Needling  Treatment instructions: Expect mild to moderate muscle soreness. S/S of pneumothorax if dry needled over a lung field, and to seek immediate medical attention should they occur. Patient verbalized understanding of these instructions and education.  Patient Consent Given: Yes Education handout provided: No Muscles treated: Rt upper trap and Rt cervical paraspinals, suboccipitals Electrical stimulation performed: No Parameters: N/A Treatment response/outcome: twitch response noted, pt with good response to treatment Moist heat placed on pt's cervical spine and Rt shoulder x 6 minutes     PATIENT EDUCATION: Education details: HEP, POC Person educated: Patient Education method: Programmer, multimedia, Demonstration, Verbal cues, and Handouts Education comprehension: verbalized understanding, returned demonstration, and verbal cues required  HOME EXERCISE PROGRAM: Access Code: [email or web address] URL: [email or web address] Date: 01/21/2023 Prepared by: Narda Amber  Exercises - Supine Shoulder Flexion Extension AAROM with Dowel  - 2 x daily - 7 x weekly - 10 reps - 2-3 seconds hold - Supine Shoulder External Rotation with Dowel  - 2 x daily - 7 x weekly - 10 reps - 2-3  seconds hold - Supine Shoulder Abduction AAROM with Dowel  - 2 x daily - 7 x weekly - 10 reps - 2-3 seconds hold - Standing Shoulder Row with Anchored Resistance  - 2 x daily - 7 x weekly - 2 sets - 10 reps - 3 seconds  hold  ASSESSMENT:  CLINICAL IMPRESSION: This visit pt agreeing to try mechanical due to reporting a great response after manual traction at her last visit. Pt tolerating well. Recommend continued skilled PT interventions.   OBJECTIVE IMPAIRMENTS: decreased mobility, decreased ROM, decreased strength, impaired UE functional use, postural dysfunction, and pain.   ACTIVITY LIMITATIONS: lifting, sleeping, dressing, reach over head, hygiene/grooming, and caring for others  PARTICIPATION LIMITATIONS: cleaning, laundry, community activity, and occupation  PERSONAL FACTORS: 3+ comorbidities: see pertinent history  are also affecting patient's functional outcome.  REHAB POTENTIAL: Good  CLINICAL DECISION MAKING: Stable/uncomplicated  EVALUATION COMPLEXITY: Low   GOALS: Goals reviewed with patient? Yes  SHORT TERM GOALS: (target date for Short term goals are 3 weeks 02/13/23)  1.Patient will demonstrate independent use of home exercise program to maintain progress from in clinic treatments. Goal status:  on-going 02/03/23  LONG TERM GOALS: (target dates for all long term goals are 10 weeks  04/03/23 )   1. Patient will demonstrate/report pain at worst less than or equal to 2/10 to facilitate minimal limitation in daily activity secondary to pain symptoms. Goal status: on-going 02/03/23   2. Patient will demonstrate independent use of home exercise program to facilitate ability to maintain/progress functional gains from skilled physical therapy services. Goal status: New   3. Patient will demonstrate FOTO outcome > or = 70 % to indicate reduced disability due to condition. Goal status: New   4.  Patient will demonstrate Rt UE MMT >/= 4/5 throughout to facilitate  lifting, reaching, carrying at [facility] in daily activity.   Goal status: New   5.  Patient will be able to lift 10# using bil UE's from floor to counter height with no pain in her RT shoulder.    Goal status: New   6.  Pt will improve her Rt shoulder flexion to >= 150 degrees actively in order to improve functional mobility.  Goal status: New     PLAN:  PT FREQUENCY: 1-2x/week  PT DURATION: 10 weeks  PLANNED INTERVENTIONS: Therapeutic exercises, Therapeutic activity, Neuro Muscular re-education, Balance training, Gait training, Patient/Family education, Joint mobilization, Stair training, DME instructions, Dry Needling, Electrical stimulation, Traction, Cryotherapy, vasopneumatic device Moist heat, Taping, Ultrasound, Ionotophoresis 4mg /ml Dexamethasone, and aquatic therapy ,Manual therapy.  All included unless contraindicated  PLAN FOR NEXT SESSION: Review HEP knowledge/results, shoulder ROM, shoulder strengthening as tolerated   Narda Amber, PT, MPT 02/09/23 2:08 PM       Date of referral: 01/12/23 Referring provider: Madelyn Brunner Referring diagnosis?  M25.511,G89.29 (ICD-10-CM) - Chronic right shoulder pain  M19.011 (ICD-10-CM) - Arthritis of right acromioclavicular joint   Treatment diagnosis? (if different than referring diagnosis) M25.511, M62.82, M25.611  What was this (referring dx) caused by? Other: degenerative changes noted in imagining  Ashby Dawes of Condition: Initial Onset (within last 3 months)   Laterality: Rt  Current Functional Measure Score: FOTO 66%  Objective measurements identify impairments when they are compared to normal values, the uninvolved extremity, and prior level of function.  [x]  Yes  []  No  Objective assessment of functional ability: Minimal functional limitations   Briefly describe symptoms: Rt shoulder pain and limited ROM  How did symptoms start: no specific trauma noted  Average pain intensity:  Last 24 hours: 4/10  Past  week: pain ranges from 4-7/10  How often does the pt experience symptoms? Constantly  How much have the symptoms interfered with usual daily activities? Moderately  How has condition changed since care began at this facility? NA - initial visit  In general, how is the patients overall health? Good   BACK PAIN (STarT Back Screening Tool) No

## 2023-02-11 ENCOUNTER — Ambulatory Visit: Payer: Medicare Other | Admitting: Sports Medicine

## 2023-02-11 ENCOUNTER — Telehealth: Payer: Self-pay | Admitting: Cardiology

## 2023-02-11 MED ORDER — ISOSORBIDE MONONITRATE ER 30 MG PO TB24: 30.0000 mg | ORAL_TABLET | Freq: Every day | ORAL | 0 refills | Status: DC

## 2023-02-11 NOTE — Telephone Encounter (Signed)
Pt's medication was sent to pt's pharmacy as requested. Confirmation received.  °

## 2023-02-11 NOTE — Telephone Encounter (Signed)
*  STAT* If patient is at the pharmacy, call can be transferred to refill team.   1. Which medications need to be refilled? (please list name of each medication and dose if known)   isosorbide mononitrate (IMDUR) 30 MG 24 hr tablet     2. Would you like to learn more about the convenience, safety, & potential cost savings by using the [facility]? No   3. Are you open to using the [facility] (Type [facility]. ) No   4. Which pharmacy/location (including street and city if local pharmacy) is medication to be sent to?[facility] [place],  - [address]    5. Do they need a 30 day or 90 day supply? 90 day

## 2023-02-17 ENCOUNTER — Ambulatory Visit: Payer: Medicare Other | Admitting: Physical Therapy

## 2023-02-17 ENCOUNTER — Encounter: Payer: Self-pay | Admitting: Physical Therapy

## 2023-02-17 ENCOUNTER — Encounter: Payer: Medicare Other | Admitting: Physical Therapy

## 2023-02-17 DIAGNOSIS — M25511 Pain in right shoulder: Secondary | ICD-10-CM | POA: Diagnosis not present

## 2023-02-17 DIAGNOSIS — M6281 Muscle weakness (generalized): Secondary | ICD-10-CM | POA: Diagnosis not present

## 2023-02-17 DIAGNOSIS — G8929 Other chronic pain: Secondary | ICD-10-CM

## 2023-02-17 DIAGNOSIS — M25611 Stiffness of right shoulder, not elsewhere classified: Secondary | ICD-10-CM | POA: Diagnosis not present

## 2023-02-17 NOTE — Therapy (Signed)
OUTPATIENT PHYSICAL THERAPY SHOULDER   Patient Name: Toni Rich MRN: [id number] DOB:[birth year], 77 y.o., female Today's Date: 02/17/2023  END OF SESSION:  PT End of Session - 02/17/23 1428     Visit Number 5    Number of Visits 20    Date for PT Re-Evaluation 04/03/23    Authorization Type [employer] approved 16 visits up to 03/18/23    Authorization - Number of Visits 16    Progress Note Due on Visit 10    PT Start Time 1350    PT Stop Time 1430    PT Time Calculation (min) 40 min    Activity Tolerance Patient tolerated treatment well    Behavior During Therapy [facility] for tasks assessed/performed               Past Medical History:  Diagnosis Date   Anxiety    Colonic polyp    Depression    Hypercholesteremia    Hypothyroid    IBS (irritable bowel syndrome)    Migraines    Palpitations    Peripheral neuropathy    Past Surgical History:  Procedure Laterality Date   CESAREAN SECTION     TUBAL LIGATION     Patient Active Problem List   Diagnosis Date Noted   Coronary artery disease involving native coronary artery of native heart with angina pectoris (HCC) 03/25/2021   Mixed hyperlipidemia 03/25/2021   Right thigh pain 03/25/2019   Right knee pain 10/28/2018   Primary osteoarthritis of first carpometacarpal joint of left hand 10/28/2018   Hypokalemia 12/17/2011   Bronchitis 12/17/2011   Bilateral atelectasis 12/15/2011   Colitis 12/13/2011   Leukocytosis 12/13/2011    PCP: Sigmund Hazel, MD   REFERRING PROVIDER: Madelyn Brunner, DO   REFERRING DIAG:  Diagnosis  M25.511,G89.29 (ICD-10-CM) - Chronic right shoulder pain  M19.011 (ICD-10-CM) - Arthritis of right acromioclavicular joint    THERAPY DIAG:  Chronic right shoulder pain  Muscle weakness (generalized)  Stiffness of right shoulder, not elsewhere classified  Rationale for Evaluation and Treatment: Rehabilitation  ONSET DATE:   SUBJECTIVE:                                                                                                                                                                                       SUBJECTIVE STATEMENT: Pt arriving today reporting 6/10 pain today in her neck. Pt reporting relief following using traction at her last visit.     PERTINENT HISTORY: She did undergo ultrasound-guided right AC joint injection on 11/11/2022, and then subsequent GHJ injection on 12/10/22.  Her left shoulder pain and biceps related pain essentially resolved after glenohumeral joint injection.  Her motion is improved as well but she is still having pain over the top of the shoulder, this has worsened over the last few weeks.  She did receive relief after the [facility] joint injection but this has returned unfortunately.  She was taking Tylenol, was also prescribed tramadol 50 mg to take twice daily as needed.  PMH: anxiety, depression, IBS, peripheral neuropathy, migraines, hypothyroid, C-section, tubal ligation  PAIN:  NPRS scale: 6/10 upon arrival Pain location: Rt shoulder, anterior mostly Pain description: throbbing Aggravating factors: sleeping Relieving factors: tylenol  PRECAUTIONS: None  WEIGHT BEARING RESTRICTIONS: No  FALLS:  Has patient fallen in last 6 months? 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  PLOF: Independent  PATIENT GOALS:Stop hurting  Next MD visit:   OBJECTIVE:   DIAGNOSTIC FINDINGS: 11/11/22 X-rays demonstrate moderate degenerative changes to the [facility] joint   PATIENT SURVEYS:  01/21/23 FOTO intake: 66%   COGNITION: Overall cognitive status: WFL     SENSATION: WFL  POSTURE: Rounded head, forward shoulder, kyphosis  UPPER EXTREMITY ROM:   ROM Right 01/21/23 Left 01/21/23   Shoulder flexion 105 150  Shoulder extension 30 40  Shoulder abduction 130 160  Shoulder adduction    Shoulder internal rotation 70 68  Shoulder external rotation 75 60  Elbow flexion    Elbow extension    (Blank rows = not tested)  UPPER EXTREMITY MMT:  MMT Right 01/21/23 Left 01/21/23  Shoulder flexion 4 5  Shoulder extension 4 5  Shoulder abduction    Shoulder adduction    Shoulder internal rotation 4 5  Shoulder external rotation 4 5  Middle trapezius    Lower trapezius    Elbow flexion    Grip strength (lbs)    (Blank rows = not tested)  SHOULDER SPECIAL TESTS: Impingement tests: Neer impingement test: negative   PALPATION:  TTP: Rt anterior GH joint, Rt supraspinatus tendon, right upper trap                                                                                                                                                                                                   TODAY'S TREATMENT:  DATE: 02/17/23:  Manual:  Skilled palpation of active trigger points during TPDN Trigger Point Dry-Needling  Treatment instructions: Expect mild to moderate muscle soreness. S/S of pneumothorax if dry needled over a lung field, and to seek immediate medical attention should they occur. Patient verbalized understanding of these instructions and education.  Patient Consent Given: Yes Education handout provided: Previously provided Muscles treated: Rt upper trap, Rt cervical paraspinals Electrical stimulation performed: No Parameters: N/A Treatment response/outcome: twitch responses noted, pt with good tolerance  Traction:  Mechanical Traction: x 20 minutes Max pull: 15 # Min pull: 10# Modalities:  Moist heat: x 5 minutes     02/09/23:  TherEx:  Pulleys: flexion, scaption x 3 minutes each Rows: x 15 green TB, holding 3 sec Cervical retraction: x 5  holding 10 sec Traction:  Mechanical Traction: x 20 minutes Max pull: 12 # Min pull: 7# Modalities:  Moist heat: x 5 minutes    02/03/23:  TherEx:  UBE: 3 minutes each direction, Level 2 Standing scapular stabilization 2# ball against the wall circles both directions x 10 bil UEs Standing wall posture c cervical retraction x 10 holding 5 sec Standing  shoulder flexion c 1# bar x 10  Standing shoulder extension red TB 2 x 10  Standing Rows: red TB 2 x 10  Seated upper trap stretch x 4 holding 10 sec Seated levator scapulae stretch x 4 holding 10 sec bilaterally Manual Trigger Point Dry-Needling  Treatment instructions: Expect mild to moderate muscle soreness. S/S of pneumothorax if dry needled over a lung field, and to seek immediate medical attention should they occur. Patient verbalized understanding of these instructions and education.  Patient Consent Given: Yes Education handout provided: No Muscles treated:  and bil cervical paraspinals, bil suboccipitals Electrical stimulation performed: No Parameters: N/A Treatment response/outcome: twitch response noted, pt with good response to treatment Moist heat placed on pt's cervical spine and Rt shoulder x 5 minutes      PATIENT EDUCATION: Education details: HEP, POC Person educated: Patient Education method: Programmer, multimedia, Demonstration, Verbal cues, and Handouts Education comprehension: verbalized understanding, returned demonstration, and verbal cues required  HOME EXERCISE PROGRAM: Access Code: [email or web address] URL: [email or web address] Date: 01/21/2023 Prepared by: Narda Amber  Exercises - Supine Shoulder Flexion Extension AAROM with Dowel  - 2 x daily - 7 x weekly - 10 reps - 2-3 seconds hold - Supine Shoulder External Rotation with Dowel  - 2 x daily - 7 x weekly - 10 reps - 2-3 seconds hold - Supine Shoulder Abduction AAROM with Dowel  - 2 x daily - 7 x weekly - 10 reps - 2-3 seconds hold - Standing  Shoulder Row with Anchored Resistance  - 2 x daily - 7 x weekly - 2 sets - 10 reps - 3 seconds  hold  ASSESSMENT:  CLINICAL IMPRESSION: Pt arriving today reporting several hours of relief following mechanical traction at her last visit. Pt wishing to try again along with DN. Pt with good response noted with multiple twitches and pt reporting less stiffness. Continued skilled PT interventions.   OBJECTIVE IMPAIRMENTS: decreased mobility, decreased ROM, decreased strength, impaired UE functional use, postural dysfunction, and pain.   ACTIVITY LIMITATIONS: lifting, sleeping, dressing, reach over head, hygiene/grooming, and caring for others  PARTICIPATION LIMITATIONS: cleaning, laundry, community activity, and occupation  PERSONAL FACTORS: 3+ comorbidities: see pertinent history  are also affecting patient's functional outcome.   REHAB POTENTIAL: Good  CLINICAL DECISION MAKING: Stable/uncomplicated  EVALUATION COMPLEXITY: Low   GOALS: Goals  reviewed with patient? Yes  SHORT TERM GOALS: (target date for Short term goals are 3 weeks 02/13/23)  1.Patient will demonstrate independent use of home exercise program to maintain progress from in clinic treatments. Goal status:  on-going 02/03/23  LONG TERM GOALS: (target dates for all long term goals are 10 weeks  04/03/23 )   1. Patient will demonstrate/report pain at worst less than or equal to 2/10 to facilitate minimal limitation in daily activity secondary to pain symptoms. Goal status: on-going 02/03/23   2. Patient will demonstrate independent use of home exercise program to facilitate ability to maintain/progress functional gains from skilled physical therapy services. Goal status: New   3. Patient will demonstrate FOTO outcome > or = 70 % to indicate reduced disability due to condition. Goal status: New   4.  Patient will demonstrate Rt UE MMT >/= 4/5 throughout to facilitate lifting, reaching, carrying at [facility] in daily activity.    Goal status: New   5.  Patient will be able to lift 10# using bil UE's from floor to counter height with no pain in her RT shoulder.    Goal status: New   6.  Pt will improve her Rt shoulder flexion to >= 150 degrees actively in order to improve functional mobility.  Goal status: New     PLAN:  PT FREQUENCY: 1-2x/week  PT DURATION: 10 weeks  PLANNED INTERVENTIONS: Therapeutic exercises, Therapeutic activity, Neuro Muscular re-education, Balance training, Gait training, Patient/Family education, Joint mobilization, Stair training, DME instructions, Dry Needling, Electrical stimulation, Traction, Cryotherapy, vasopneumatic device Moist heat, Taping, Ultrasound, Ionotophoresis 4mg /ml Dexamethasone, and aquatic therapy ,Manual therapy.  All included unless contraindicated  PLAN FOR NEXT SESSION: update HEP as needed, shoulder ROM, shoulder strengthening as tolerated   Narda Amber, PT, MPT 02/17/23 2:29 PM       Date of referral: 01/12/23 Referring provider: Madelyn Brunner Referring diagnosis?  M25.511,G89.29 (ICD-10-CM) - Chronic right shoulder pain  M19.011 (ICD-10-CM) - Arthritis of right acromioclavicular joint   Treatment diagnosis? (if different than referring diagnosis) M25.511, M62.82, M25.611  What was this (referring dx) caused by? Other: degenerative changes noted in imagining  Ashby Dawes of Condition: Initial Onset (within last 3 months)   Laterality: Rt  Current Functional Measure Score: FOTO 66%  Objective measurements identify impairments when they are compared to normal values, the uninvolved extremity, and prior level of function.  [x]  Yes  []  No  Objective assessment of functional ability: Minimal functional limitations   Briefly describe symptoms: Rt shoulder pain and limited ROM  How did symptoms start: no specific trauma noted  Average pain intensity:  Last 24 hours: 4/10  Past week: pain ranges from 4-7/10  How often does the pt experience  symptoms? Constantly  How much have the symptoms interfered with usual daily activities? Moderately  How has condition changed since care began at this facility? NA - initial visit  In general, how is the patients overall health? Good   BACK PAIN (STarT Back Screening Tool) No

## 2023-02-23 ENCOUNTER — Encounter: Payer: Self-pay | Admitting: Physical Therapy

## 2023-02-23 ENCOUNTER — Ambulatory Visit: Payer: Medicare Other | Admitting: Physical Therapy

## 2023-02-23 DIAGNOSIS — M6281 Muscle weakness (generalized): Secondary | ICD-10-CM

## 2023-02-23 DIAGNOSIS — G8929 Other chronic pain: Secondary | ICD-10-CM | POA: Diagnosis not present

## 2023-02-23 DIAGNOSIS — M25611 Stiffness of right shoulder, not elsewhere classified: Secondary | ICD-10-CM

## 2023-02-23 DIAGNOSIS — M25511 Pain in right shoulder: Secondary | ICD-10-CM | POA: Diagnosis not present

## 2023-02-23 NOTE — Therapy (Signed)
OUTPATIENT PHYSICAL THERAPY SHOULDER   Patient Name: Toni Rich MRN: [id number] DOB:02/09/1946, 77 y.o., female Today's Date: 02/23/2023  END OF SESSION:  PT End of Session - 02/23/23 1308     Visit Number 6    Number of Visits 20    Authorization Type [employer] approved 16 visits up to 03/18/23    Authorization - Number of Visits 16    Progress Note Due on Visit 10    PT Start Time 1300    PT Stop Time 1340    PT Time Calculation (min) 40 min    Activity Tolerance Patient tolerated treatment well    Behavior During Therapy WFL for tasks assessed/performed               Past Medical History:  Diagnosis Date   Anxiety    Colonic polyp    Depression    Hypercholesteremia    Hypothyroid    IBS (irritable bowel syndrome)    Migraines    Palpitations    Peripheral neuropathy    Past Surgical History:  Procedure Laterality Date   CESAREAN SECTION     TUBAL LIGATION     Patient Active Problem List   Diagnosis Date Noted   Coronary artery disease involving native coronary artery of native heart with angina pectoris (HCC) 03/25/2021   Mixed hyperlipidemia 03/25/2021   Right thigh pain 03/25/2019   Right knee pain 10/28/2018   Primary osteoarthritis of first carpometacarpal joint of left hand 10/28/2018   Hypokalemia 12/17/2011   Bronchitis 12/17/2011   Bilateral atelectasis 12/15/2011   Colitis 12/13/2011   Leukocytosis 12/13/2011    PCP: Sigmund Hazel, MD   REFERRING PROVIDER: Madelyn Brunner, DO   REFERRING DIAG:  Diagnosis  M25.511,G89.29 (ICD-10-CM) - Chronic right shoulder pain  M19.011 (ICD-10-CM) - Arthritis of right acromioclavicular joint    THERAPY DIAG:  Chronic right shoulder pain  Muscle weakness (generalized)  Stiffness of right shoulder, not elsewhere classified  Rationale for Evaluation and Treatment: Rehabilitation  ONSET DATE:   SUBJECTIVE:                                                                                                                                                                                       SUBJECTIVE STATEMENT: Pt arriving today reporting 6/10 pain today in her neck. Pt reporting relief following using traction at her last visit.     PERTINENT HISTORY: She did undergo ultrasound-guided right AC joint injection on 11/11/2022, and then subsequent GHJ injection on 12/10/22.  Her left shoulder pain and biceps related pain essentially resolved after glenohumeral joint injection.  Her motion is improved as well but  she is still having pain over the top of the shoulder, this has worsened over the last few weeks.  She did receive relief after the [facility] joint injection but this has returned unfortunately.  She was taking Tylenol, was also prescribed tramadol 50 mg to take twice daily as needed.  PMH: anxiety, depression, IBS, peripheral neuropathy, migraines, hypothyroid, C-section, tubal ligation  PAIN:  NPRS scale: 6/10 upon arrival Pain location: Rt shoulder, anterior mostly Pain description: throbbing Aggravating factors: sleeping Relieving factors: tylenol  PRECAUTIONS: None  WEIGHT BEARING RESTRICTIONS: No  FALLS:  Has patient fallen in last 6 months? 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  PLOF: Independent  PATIENT GOALS:Stop hurting  Next MD visit:   OBJECTIVE:   DIAGNOSTIC FINDINGS: 11/11/22 X-rays demonstrate moderate degenerative changes to the [facility] joint   PATIENT SURVEYS:  01/21/23 FOTO intake: 66%   COGNITION: Overall cognitive status: WFL     SENSATION: WFL  POSTURE: Rounded head, forward shoulder, kyphosis  UPPER EXTREMITY ROM:   ROM Right 01/21/23 Left 01/21/23  Shoulder flexion 105 150  Shoulder  extension 30 40  Shoulder abduction 130 160  Shoulder adduction    Shoulder internal rotation 70 68  Shoulder external rotation 75 60  Elbow flexion    Elbow extension    (Blank rows = not tested)  UPPER EXTREMITY MMT:  MMT Right 01/21/23 Left 01/21/23  Shoulder flexion 4 5  Shoulder extension 4 5  Shoulder abduction    Shoulder adduction    Shoulder internal rotation 4 5  Shoulder external rotation 4 5  Middle trapezius    Lower trapezius    Elbow flexion    Grip strength (lbs)    (Blank rows = not tested)  SHOULDER SPECIAL TESTS: Impingement tests: Neer impingement test: negative   PALPATION:  TTP: Rt anterior GH joint, Rt supraspinatus tendon, right upper trap                                                                                                                                                                                                   TODAY'S TREATMENT:  DATE: 02/23/23:  TherEx:  Cervical rotation: x 3 holding  Cervical retraction: x 10 holding 3-5 seconds Bil shoulder flexion: x 10 rolling ball up the wall Rows: green TB x 20 holding 3 sec Manual:  Skilled palpation of active trigger points during TPDN Trigger Point Dry-Needling  Treatment instructions: Expect mild to moderate muscle soreness. S/S of pneumothorax if dry needled over a lung field, and to seek immediate medical attention should they occur. Patient verbalized understanding of these instructions and education.  Patient Consent Given: Yes Education handout provided: Previously provided Muscles treated: , Bil cervical paraspinals, bil sub occipitals Electrical stimulation performed: No Parameters: N/A Treatment response/outcome: twitch responses noted, pt with good tolerance      02/17/23:  Manual:  Skilled palpation of active trigger points during TPDN Trigger Point  Dry-Needling  Treatment instructions: Expect mild to moderate muscle soreness. S/S of pneumothorax if dry needled over a lung field, and to seek immediate medical attention should they occur. Patient verbalized understanding of these instructions and education.  Patient Consent Given: Yes Education handout provided: Previously provided Muscles treated: Rt upper trap, Rt cervical paraspinals Electrical stimulation performed: No Parameters: N/A Treatment response/outcome: twitch responses noted, pt with good tolerance  Traction:  Mechanical Traction: x 20 minutes Max pull: 15 # Min pull: 10# Modalities:  Moist heat: x 5 minutes     02/09/23:  TherEx:  Pulleys: flexion, scaption x 3 minutes each Rows: x 15 green TB, holding 3 sec Cervical retraction: x 5 holding 10 sec Traction:  Mechanical Traction: x 20 minutes Max pull: 12 # Min pull: 7# Modalities:  Moist heat: x 5 minutes    02/03/23:  TherEx:  UBE: 3 minutes each direction, Level 2 Standing scapular stabilization 2# ball against the wall circles both directions x 10 bil UEs Standing wall posture c cervical retraction x 10 holding 5 sec Standing  shoulder flexion c 1# bar x 10  Standing shoulder extension red TB 2 x 10  Standing Rows: red TB 2 x 10  Seated upper trap stretch x 4 holding 10 sec Seated levator scapulae stretch x 4 holding 10 sec bilaterally Manual Trigger Point Dry-Needling  Treatment instructions: Expect mild to moderate muscle soreness. S/S of pneumothorax if dry needled over a lung field, and to seek immediate medical attention should they occur. Patient verbalized understanding of these instructions and education.  Patient Consent Given: Yes Education handout provided: No Muscles treated:  and bil cervical paraspinals, bil suboccipitals Electrical stimulation performed: No Parameters: N/A Treatment response/outcome: twitch response noted, pt with good response to treatment Moist heat placed  on pt's cervical spine and Rt shoulder x 5 minutes      PATIENT EDUCATION: Education details: HEP, POC Person educated: Patient Education method: Programmer, multimedia, Demonstration, Verbal cues, and Handouts Education comprehension: verbalized understanding, returned demonstration, and verbal cues required  HOME EXERCISE PROGRAM: Access Code: [email or web address] URL: [email or web address] Date: 01/21/2023 Prepared by: Narda Amber  Exercises - Supine Shoulder Flexion Extension AAROM with Dowel  - 2 x daily - 7 x weekly - 10 reps - 2-3 seconds hold - Supine Shoulder External Rotation with Dowel  - 2 x daily - 7 x weekly - 10 reps - 2-3 seconds hold - Supine Shoulder Abduction AAROM with Dowel  - 2 x daily - 7 x weekly - 10 reps - 2-3 seconds hold - Standing Shoulder Row with Anchored Resistance  - 2 x daily - 7 x weekly - 2 sets - 10 reps - 3  seconds  hold  ASSESSMENT:  CLINICAL IMPRESSION: Pt wishing to try DN again due to good response. Pt reporting 7-8/10 pain upon arrival. Pt reporting pain of 6/10 at end of session.  Continue with skilled interventions to maximize pt's function toward goals set.   OBJECTIVE IMPAIRMENTS: decreased mobility, decreased ROM, decreased strength, impaired UE functional use, postural dysfunction, and pain.   ACTIVITY LIMITATIONS: lifting, sleeping, dressing, reach over head, hygiene/grooming, and caring for others  PARTICIPATION LIMITATIONS: cleaning, laundry, community activity, and occupation  PERSONAL FACTORS: 3+ comorbidities: see pertinent history  are also affecting patient's functional outcome.   REHAB POTENTIAL: Good  CLINICAL DECISION MAKING: Stable/uncomplicated  EVALUATION COMPLEXITY: Low   GOALS: Goals reviewed with patient? Yes  SHORT TERM GOALS: (target date for Short term goals are 3 weeks 02/13/23)  1.Patient will demonstrate independent use of home exercise program to maintain progress from in clinic treatments. Goal  status:MET 02/23/23  LONG TERM GOALS: (target dates for all long term goals are 10 weeks  04/03/23 )   1. Patient will demonstrate/report pain at worst less than or equal to 2/10 to facilitate minimal limitation in daily activity secondary to pain symptoms. Goal status: on-going 02/23/23   2. Patient will demonstrate independent use of home exercise program to facilitate ability to maintain/progress functional gains from skilled physical therapy services. Goal status: New   3. Patient will demonstrate FOTO outcome > or = 70 % to indicate reduced disability due to condition. Goal status: New   4.  Patient will demonstrate Rt UE MMT >/= 4/5 throughout to facilitate lifting, reaching, carrying at [facility] in daily activity.   Goal status: New   5.  Patient will be able to lift 10# using bil UE's from floor to counter height with no pain in her RT shoulder.    Goal status:on-going 02/23/23  6.  Pt will improve her Rt shoulder flexion to >= 150 degrees actively in order to improve functional mobility.  Goal status: New     PLAN:  PT FREQUENCY: 1-2x/week  PT DURATION: 10 weeks  PLANNED INTERVENTIONS: Therapeutic exercises, Therapeutic activity, Neuro Muscular re-education, Balance training, Gait training, Patient/Family education, Joint mobilization, Stair training, DME instructions, Dry Needling, Electrical stimulation, Traction, Cryotherapy, vasopneumatic device Moist heat, Taping, Ultrasound, Ionotophoresis 4mg /ml Dexamethasone, and aquatic therapy ,Manual therapy.  All included unless contraindicated  PLAN FOR NEXT SESSION: update HEP as needed, shoulder ROM, shoulder strengthening as tolerated   Narda Amber, PT, MPT 02/23/23 1:37 PM       Date of referral: 01/12/23 Referring provider: Madelyn Brunner Referring diagnosis?  M25.511,G89.29 (ICD-10-CM) - Chronic right shoulder pain  M19.011 (ICD-10-CM) - Arthritis of right acromioclavicular joint   Treatment diagnosis? (if  different than referring diagnosis) M25.511, M62.82, M25.611  What was this (referring dx) caused by? Other: degenerative changes noted in imagining  Ashby Dawes of Condition: Initial Onset (within last 3 months)   Laterality: Rt  Current Functional Measure Score: FOTO 66%  Objective measurements identify impairments when they are compared to normal values, the uninvolved extremity, and prior level of function.  [x]  Yes  []  No  Objective assessment of functional ability: Minimal functional limitations   Briefly describe symptoms: Rt shoulder pain and limited ROM  How did symptoms start: no specific trauma noted  Average pain intensity:  Last 24 hours: 4/10  Past week: pain ranges from 4-7/10  How often does the pt experience symptoms? Constantly  How much have the symptoms interfered with usual daily activities? Moderately  How has condition changed since care began at this facility? NA - initial visit  In general, how is the patients overall health? Good   BACK PAIN (STarT Back Screening Tool) No

## 2023-03-02 ENCOUNTER — Telehealth: Payer: Self-pay | Admitting: Cardiology

## 2023-03-02 ENCOUNTER — Ambulatory Visit: Payer: Medicare Other | Admitting: Adult Health

## 2023-03-02 NOTE — Telephone Encounter (Signed)
Spoke patient and she states she is having chest pain off and on for two weeks. Currently asymptomatic. Denies any SOB, swelling, nausea or vomiting. Appointment scheduled with APP. ED precautions discussed.

## 2023-03-02 NOTE — Telephone Encounter (Signed)
   Pt c/o of Chest Pain: STAT if active CP, including tightness, pressure, jaw pain, radiating pain to shoulder/upper arm/back, CP unrelieved by Nitro. Symptoms reported of SOB, nausea, vomiting, sweating.  1. Are you having CP right now? Pain in her chest- not at this time    2. Are you experiencing any other symptoms (ex. SOB, nausea, vomiting, sweating)? no   3. Is your CP continuous or coming and going? Comes and goes   4. Have you taken Nitroglycerin? no   5. How long have you been experiencing CP?  A couple of weeks- I made her an appointment for Friday(03-06-23) with Joni Reining- please call to evaluate    6. If NO CP at time of call then end call with telling Pt to call back or call 911 if Chest pain returns prior to return call from triage team.

## 2023-03-03 ENCOUNTER — Ambulatory Visit: Payer: Medicare Other | Admitting: Physical Therapy

## 2023-03-03 ENCOUNTER — Encounter: Payer: Self-pay | Admitting: Physical Therapy

## 2023-03-03 DIAGNOSIS — R079 Chest pain, unspecified: Secondary | ICD-10-CM | POA: Diagnosis not present

## 2023-03-03 DIAGNOSIS — G8929 Other chronic pain: Secondary | ICD-10-CM

## 2023-03-03 DIAGNOSIS — M25511 Pain in right shoulder: Secondary | ICD-10-CM | POA: Diagnosis not present

## 2023-03-03 DIAGNOSIS — M6281 Muscle weakness (generalized): Secondary | ICD-10-CM | POA: Diagnosis not present

## 2023-03-03 DIAGNOSIS — M25611 Stiffness of right shoulder, not elsewhere classified: Secondary | ICD-10-CM | POA: Diagnosis not present

## 2023-03-03 NOTE — Therapy (Signed)
OUTPATIENT PHYSICAL THERAPY SHOULDER   Patient Name: Toni Rich MRN: [id number] DOB:09/23/1945, 77 y.o., female Today's Date: 03/03/2023  END OF SESSION:  PT End of Session - 03/03/23 0851     Visit Number 7    Number of Visits 20    Date for PT Re-Evaluation 04/03/23    Authorization Type [employer] approved 16 visits up to 03/18/23    Authorization - Number of Visits 16    Progress Note Due on Visit 10    PT Start Time 0845    PT Stop Time 0925    PT Time Calculation (min) 40 min    Activity Tolerance Patient tolerated treatment well    Behavior During Therapy [facility] for tasks assessed/performed                Past Medical History:  Diagnosis Date   Anxiety    Colonic polyp    Depression    Hypercholesteremia    Hypothyroid    IBS (irritable bowel syndrome)    Migraines    Palpitations    Peripheral neuropathy    Past Surgical History:  Procedure Laterality Date   CESAREAN SECTION     TUBAL LIGATION     Patient Active Problem List   Diagnosis Date Noted   Coronary artery disease involving native coronary artery of native heart with angina pectoris (HCC) 03/25/2021   Mixed hyperlipidemia 03/25/2021   Right thigh pain 03/25/2019   Right knee pain 10/28/2018   Primary osteoarthritis of first carpometacarpal joint of left hand 10/28/2018   Hypokalemia 12/17/2011   Bronchitis 12/17/2011   Bilateral atelectasis 12/15/2011   Colitis 12/13/2011   Leukocytosis 12/13/2011    PCP: Sigmund Hazel, MD   REFERRING PROVIDER: Madelyn Brunner, DO   REFERRING DIAG:  Diagnosis  M25.511,G89.29 (ICD-10-CM) - Chronic right shoulder pain  M19.011 (ICD-10-CM) - Arthritis of right acromioclavicular joint    THERAPY DIAG:  Chronic right shoulder pain  Muscle weakness (generalized)  Stiffness of right shoulder, not elsewhere classified  Rationale for Evaluation and Treatment: Rehabilitation  ONSET DATE:   SUBJECTIVE:                                                                                                                                                                                       SUBJECTIVE STATEMENT: Pt arriving today reporting 5/10 pain in her Rt side neck. Pt reporting it's been bothering her over the last few days. Pt also reporting she has been moving and her Rt pectoralis and clavicle is sore.     PERTINENT HISTORY: She did undergo ultrasound-guided right AC joint injection on 11/11/2022, and then subsequent GHJ  injection on 12/10/22.  Her left shoulder pain and biceps related pain essentially resolved after glenohumeral joint injection.  Her motion is improved as well but she is still having pain over the top of the shoulder, this has worsened over the last few weeks.  She did receive relief after the [facility] joint injection but this has returned unfortunately.  She was taking Tylenol, was also prescribed tramadol 50 mg to take twice daily as needed.  PMH: anxiety, depression, IBS, peripheral neuropathy, migraines, hypothyroid, C-section, tubal ligation  PAIN:  NPRS scale: 5/10 upon arrival Pain location: Rt shoulder, anterior mostly Pain description: throbbing Aggravating factors: sleeping Relieving factors: tylenol  PRECAUTIONS: None  WEIGHT BEARING RESTRICTIONS: No  FALLS:  Has patient fallen in last 6 months? 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  PLOF: Independent  PATIENT GOALS:Stop hurting  Next MD visit:   OBJECTIVE:   DIAGNOSTIC FINDINGS: 11/11/22 X-rays demonstrate moderate degenerative changes to the [facility] joint   PATIENT SURVEYS:  01/21/23 FOTO intake: 66%  03/03/23:  FOTO update 69%  COGNITION: Overall cognitive status:  WFL     SENSATION: WFL  POSTURE: Rounded head, forward shoulder, kyphosis  UPPER EXTREMITY ROM:   ROM Right 01/21/23 Left 01/21/23  Shoulder flexion 105 150  Shoulder extension 30 40  Shoulder abduction 130 160  Shoulder adduction    Shoulder internal rotation 70 68  Shoulder external rotation 75 60  Elbow flexion    Elbow extension    (Blank rows = not tested)  UPPER EXTREMITY MMT:  MMT Right 01/21/23 Left 01/21/23  Shoulder flexion 4 5  Shoulder extension 4 5  Shoulder abduction    Shoulder adduction    Shoulder internal rotation 4 5  Shoulder external rotation 4 5  Middle trapezius    Lower trapezius    Elbow flexion    Grip strength (lbs)    (Blank rows = not tested)  SHOULDER SPECIAL TESTS: Impingement tests: Neer impingement test: negative   PALPATION:  TTP: Rt anterior GH joint, Rt supraspinatus tendon, right upper trap                                                                                                                                                                                                   TODAY'S TREATMENT:  DATE: 03/03/23:  TherEx Rows: green TB x 20  Shoulder Ex c 1 # weight 2 x 10  Scapular retraction x 10 holding 3 sec Cervical rotation x 5 holding 5 sec bil  Levator stretch: x 3 bil holding 10 sec Manual:  Skilled palpation of active trigger points during TPDN Trigger Point Dry-Needling  Treatment instructions: Expect mild to moderate muscle soreness. S/S of pneumothorax if dry needled over a lung field, and to seek immediate medical attention should they occur. Patient verbalized understanding of these instructions and education.  Patient Consent Given: Yes Education handout provided: Previously provided Muscles treated: cervical paraspinals, sub occipitals Electrical stimulation performed: No Parameters:  N/A Treatment response/outcome: multiple twitch response, pt reporting less stiffness at end of session     02/23/23:  TherEx:  Cervical rotation: x 3 holding  Cervical retraction: x 10 holding 3-5 seconds Bil shoulder flexion: x 10 rolling ball up the wall Rows: green TB x 20 holding 3 sec Manual:  Skilled palpation of active trigger points during TPDN Trigger Point Dry-Needling  Treatment instructions: Expect mild to moderate muscle soreness. S/S of pneumothorax if dry needled over a lung field, and to seek immediate medical attention should they occur. Patient verbalized understanding of these instructions and education.  Patient Consent Given: Yes Education handout provided: Previously provided Muscles treated: , Bil cervical paraspinals, bil sub occipitals Electrical stimulation performed: No Parameters: N/A Treatment response/outcome: twitch responses noted, pt with good tolerance      02/17/23:  Manual:  Skilled palpation of active trigger points during TPDN Trigger Point Dry-Needling  Treatment instructions: Expect mild to moderate muscle soreness. S/S of pneumothorax if dry needled over a lung field, and to seek immediate medical attention should they occur. Patient verbalized understanding of these instructions and education.  Patient Consent Given: Yes Education handout provided: Previously provided Muscles treated: Rt upper trap, Rt cervical paraspinals Electrical stimulation performed: No Parameters: N/A Treatment response/outcome: twitch responses noted, pt with good tolerance  Traction:  Mechanical Traction: x 20 minutes Max pull: 15 # Min pull: 10# Modalities:  Moist heat: x 5 minutes     02/09/23:  TherEx:  Pulleys: flexion, scaption x 3 minutes each Rows: x 15 green TB, holding 3 sec Cervical retraction: x 5 holding 10 sec Traction:  Mechanical Traction: x 20 minutes Max pull: 12 # Min pull: 7# Modalities:  Moist heat: x 5  minutes       PATIENT EDUCATION: Education details: HEP, POC Person educated: Patient Education method: Programmer, multimedia, Demonstration, Verbal cues, and Handouts Education comprehension: verbalized understanding, returned demonstration, and verbal cues required  HOME EXERCISE PROGRAM: Access Code: [email or web address] URL: [email or web address] Date: 03/03/2023 Prepared by: Narda Amber  Exercises - Supine Shoulder Flexion Extension AAROM with Dowel  - 2 x daily - 7 x weekly - 10 reps - 2-3 seconds hold - Supine Shoulder External Rotation with Dowel  - 2 x daily - 7 x weekly - 10 reps - 2-3 seconds hold - Supine Shoulder Abduction AAROM with Dowel  - 2 x daily - 7 x weekly - 10 reps - 2-3 seconds hold - Standing Shoulder Row with Anchored Resistance  - 2 x daily - 7 x weekly - 2 sets - 10 reps - 3 seconds  hold - Seated Cervical Rotation AROM  - 1-2 x daily - 7 x weekly - 5 reps - 5-10 seconds hold - Seated Scapular Retraction  - 1-2 x daily - 7 x weekly - 10 reps - 5  seconds hold - Gentle Levator Scapulae Stretch  - 1-2 x daily - 7 x weekly - 5 reps - 5-10 seconds hold  ASSESSMENT:  CLINICAL IMPRESSION: Pt arriving today reporting 5/10 pain in her cervical spine more on the Rt side and into rt upper trap and levator scapulae. Pt's FOTO score has improved to 69%. Pt still progressing slowly with therapy. Pt's HEP was updated this visit. Continue skilled PT interventions to maximize pt's function.   OBJECTIVE IMPAIRMENTS: decreased mobility, decreased ROM, decreased strength, impaired UE functional use, postural dysfunction, and pain.   ACTIVITY LIMITATIONS: lifting, sleeping, dressing, reach over head, hygiene/grooming, and caring for others  PARTICIPATION LIMITATIONS: cleaning, laundry, community activity, and occupation  PERSONAL FACTORS: 3+ comorbidities: see pertinent history  are also affecting patient's functional outcome.   REHAB POTENTIAL: Good  CLINICAL DECISION  MAKING: Stable/uncomplicated  EVALUATION COMPLEXITY: Low   GOALS: Goals reviewed with patient? Yes  SHORT TERM GOALS: (target date for Short term goals are 3 weeks 02/13/23)  1.Patient will demonstrate independent use of home exercise program to maintain progress from in clinic treatments. Goal status:MET 02/23/23  LONG TERM GOALS: (target dates for all long term goals are 10 weeks  04/03/23 )   1. Patient will demonstrate/report pain at worst less than or equal to 2/10 to facilitate minimal limitation in daily activity secondary to pain symptoms. Goal status: on-going 02/23/23   2. Patient will demonstrate independent use of home exercise program to facilitate ability to maintain/progress functional gains from skilled physical therapy services. Goal status: New   3. Patient will demonstrate FOTO outcome > or = 70 % to indicate reduced disability due to condition. Goal status: 0n-going 03/03/23   4.  Patient will demonstrate Rt UE MMT >/= 4/5 throughout to facilitate lifting, reaching, carrying at [facility] in daily activity.   Goal status: New   5.  Patient will be able to lift 10# using bil UE's from floor to counter height with no pain in her RT shoulder.    Goal status:on-going 02/23/23  6.  Pt will improve her Rt shoulder flexion to >= 150 degrees actively in order to improve functional mobility.  Goal status: New     PLAN:  PT FREQUENCY: 1-2x/week  PT DURATION: 10 weeks  PLANNED INTERVENTIONS: Therapeutic exercises, Therapeutic activity, Neuro Muscular re-education, Balance training, Gait training, Patient/Family education, Joint mobilization, Stair training, DME instructions, Dry Needling, Electrical stimulation, Traction, Cryotherapy, vasopneumatic device Moist heat, Taping, Ultrasound, Ionotophoresis 4mg /ml Dexamethasone, and aquatic therapy ,Manual therapy.  All included unless contraindicated  PLAN FOR NEXT SESSION: manual STM for cerivcal spine and bil shoulders  Rt>: left, shoulder ROM, shoulder strengthening as tolerated DN as needed   Narda Amber, PT, MPT 03/03/23 9:34 AM       Date of referral: 01/12/23 Referring provider: Madelyn Brunner Referring diagnosis?  M25.511,G89.29 (ICD-10-CM) - Chronic right shoulder pain  M19.011 (ICD-10-CM) - Arthritis of right acromioclavicular joint   Treatment diagnosis? (if different than referring diagnosis) M25.511, M62.82, M25.611  What was this (referring dx) caused by? Other: degenerative changes noted in imagining  Ashby Dawes of Condition: Initial Onset (within last 3 months)   Laterality: Rt  Current Functional Measure Score: FOTO 66%  Objective measurements identify impairments when they are compared to normal values, the uninvolved extremity, and prior level of function.  [x]  Yes  []  No  Objective assessment of functional ability: Minimal functional limitations   Briefly describe symptoms: Rt shoulder pain and limited ROM  How did symptoms  start: no specific trauma noted  Average pain intensity:  Last 24 hours: 4/10  Past week: pain ranges from 4-7/10  How often does the pt experience symptoms? Constantly  How much have the symptoms interfered with usual daily activities? Moderately  How has condition changed since care began at this facility? NA - initial visit  In general, how is the patients overall health? Good   BACK PAIN (STarT Back Screening Tool) No

## 2023-03-03 NOTE — Progress Notes (Unsigned)
Cardiology Clinic Note   Patient Name: Toni Rich Date of Encounter: 03/04/2023  Primary Care Provider:  Sigmund Hazel, MD Primary Cardiologist:  Donato Schultz, MD  Patient Profile    Toni Rich 77 year old female presents to the clinic today for an evaluation of her chest discomfort.  Past Medical History    Past Medical History:  Diagnosis Date   Anxiety    Colonic polyp    Depression    Hypercholesteremia    Hypothyroid    IBS (irritable bowel syndrome)    Migraines    Palpitations    Peripheral neuropathy    Past Surgical History:  Procedure Laterality Date   CESAREAN SECTION     TUBAL LIGATION      Allergies  Allergies  Allergen Reactions   Sulfa Antibiotics Other (See Comments)    unknown    History of Present Illness    Toni Rich has a PMH of coronary artery disease with a coronary calcium score of 230, palpitations, hypothyroidism, hyperlipidemia, depression, anxiety, and GERD.  She establish care in 2013.  She was seen several times in follow-up.  It was felt that her chest pain was atypical.  She does have a strong family history of coronary artery disease.  A coronary CT with FFR was ordered.  It showed a coronary calcium score of 239.  (5/21) three-vessel coronary disease was noted but was nonflow limiting.  Her FFR study showed borderline flow-limiting mid LAD and medical management was recommended.  She followed up with Dr. Anne Fu 12/22.  During that time she was doing well.  She occasionally noted atypical chest discomfort and palpitations at night.  She was seen in follow-up by Wallis Bamberg, NP on 05/13/2022.  During that time she reported a sinus infection that she was recovering from.  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  She was very active.  She denied chest pain palpitations, dizziness, presyncope, lower extremity edema.  Her cholesterol was discussed.  Her LDL was noted to be 46.  Her triglycerides  were slightly elevated at 200.  Dietary modifications were discussed.  She contacted nurse triage line on 03/02/2023.  She reported having on and off chest pain for 2 weeks.  At the time of the call she was asymptomatic.  She denied shortness of breath and other anginal type symptoms.  She denied nitroglycerin use.  She presents to the clinic today for follow-up evaluation and states she has retired.  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  She does keep 1 child 2 to 3 days/week.  We reviewed her September cholesterol panel and previous coronary CTA.  She reports that in May she did have a fall that involved tripping with a dog.  She is now doing physical therapy and doing upper extremity type exercises.  I reassured her that her chest discomfort is musculoskeletal in nature.  I recommended cool and warm compresses, Tylenol for pain, and we will plan follow-up in 1 year.  Today she denies  shortness of breath, lower extremity edema, fatigue, palpitations, melena, hematuria, hemoptysis, diaphoresis, weakness, presyncope, syncope, orthopnea, and PND.    Home Medications    Prior to Admission medications   Medication Sig Start Date End Date Taking? Authorizing Provider  alendronate (FOSAMAX) 70 MG tablet Take 70 mg by mouth once a week. 04/18/22   [provider]  aspirin EC 81 MG tablet Take 1 tablet (81 mg total)  by mouth daily. 08/26/19   Jake Bathe, MD  atenolol (TENORMIN) 50 MG tablet Take 50 mg by mouth daily.    [provider]  busPIRone (BUSPAR) 15 MG tablet 1 tablet Orally Twice a day 04/22/22   [provider]  Calcium Carbonate-Vitamin D (CALCIUM 600 + D PO) Take 1 tablet by mouth daily.    [provider]  cholecalciferol (VITAMIN D) 1000 UNITS tablet Take 2,000 Units by mouth daily.    [provider]  citalopram (CELEXA) 20 MG tablet Take 40 mg by mouth daily.    [provider]  fluticasone (FLONASE) 50 MCG/ACT nasal spray  Place 2 sprays into both nostrils daily.    [provider]  isosorbide mononitrate (IMDUR) 30 MG 24 hr tablet Take 1 tablet (30 mg total) by mouth daily. 02/11/23   Jake Bathe, MD  lansoprazole (PREVACID) 30 MG capsule Take 30 mg by mouth daily at 12 noon.    [provider]  levothyroxine (SYNTHROID, LEVOTHROID) 75 MCG tablet Take 75 mcg by mouth daily.    [provider]  LORazepam (ATIVAN) 1 MG tablet Take 1 mg by mouth every 8 (eight) hours.    [provider]  Multiple Vitamins-Minerals (MULTIVITAMIN PO) Take 1 tablet by mouth daily.    [provider]  omeprazole (PRILOSEC) 40 MG capsule Take 40 mg by mouth daily.    [provider]  predniSONE (DELTASONE) 10 MG tablet Take 2 tablets (20mg  total) once daily for 5 days, then take 1 tablet (10mg ) once daily for 5 additional days 02/09/23   Madelyn Brunner, DO  rosuvastatin (CRESTOR) 40 MG tablet TAKE 1 TABLET BY MOUTH DAILY 06/18/22   Flossie Dibble, NP  sucralfate (CARAFATE) 1 g tablet Take 1 g by mouth 2 (two) times daily.    [provider]  traMADol (ULTRAM) 50 MG tablet Take 1 tablet (50 mg total) by mouth every 12 (twelve) hours as needed. 01/23/23   Madelyn Brunner, DO  traZODone (DESYREL) 50 MG tablet Take 25 mg by mouth at bedtime as needed for sleep.     [provider]    Family History    Family History  Problem Relation Age of Onset   Heart attack Sister    She indicated that the status of her sister is unknown.  Social History    Social History   Socioeconomic History   Marital status: Widowed    Spouse name: Not on file   Number of children: Not on file   Years of education: Not on file   Highest education level: Not on file  Occupational History   Not on file  Tobacco Use   Smoking status: Former   Smokeless tobacco: Never  Vaping Use   Vaping status: Never Used  Substance and Sexual Activity   Alcohol use: No   Drug use: No   Sexual  activity: Not Currently  Other Topics Concern   Not on file  Social History Narrative   Not on file   Social Determinants of Health   Financial Resource Strain: Not on file  Food Insecurity: Not on file  Transportation Needs: Not on file  Physical Activity: Not on file  Stress: Not on file  Social Connections: Not on file  Intimate Partner Violence: Not on file     Review of Systems    General:  No chills, fever, night sweats or weight changes.  Cardiovascular:  No chest pain, dyspnea on  exertion, edema, orthopnea, palpitations, paroxysmal nocturnal dyspnea. Dermatological: No rash, lesions/masses Respiratory: No cough, dyspnea Urologic: No hematuria, dysuria Abdominal:   No nausea, vomiting, diarrhea, bright red blood per rectum, melena, or hematemesis Neurologic:  No visual changes, wkns, changes in mental status. All other systems reviewed and are otherwise negative except as noted above.  Physical Exam    VS:  BP (!) 124/58 (BP Location: Left Arm, Patient Position: Sitting, Cuff Size: Normal)   Pulse 69   Ht 5\' 1"  (1.549 m)   Wt 139 lb (63 kg)   SpO2 99%   BMI 26.26 kg/m  , BMI Body mass index is 26.26 kg/m. GEN: Well nourished, well developed, in no acute distress. HEENT: normal. Neck: Supple, no JVD, carotid bruits, or masses. Cardiac: RRR, no murmurs, rubs, or gallops. No clubbing, cyanosis, edema.  Radials/DP/PT 2+ and equal bilaterally.  Respiratory:  Respirations regular and unlabored, clear to auscultation bilaterally. GI: Soft, nontender, nondistended, BS + x 4. MS: no deformity or atrophy. Skin: warm and dry, no rash. Neuro:  Strength and sensation are intact. Psych: Normal affect.  Accessory Clinical Findings    Recent Labs: No results found for requested labs within last 365 days.   Recent Lipid Panel No results found for: "CHOL", "TRIG", "HDL", "CHOLHDL", "VLDL", "LDLCALC", "LDLDIRECT"       ECG personally reviewed by me today- EKG  Interpretation Date/Time:  Wednesday March 04 2023 13:33:45 EST Ventricular Rate:  69 PR Interval:  150 QRS Duration:  56 QT Interval:  422 QTC Calculation: 452 R Axis:   20  Text Interpretation: Normal sinus rhythm Low voltage QRS Septal infarct , age undetermined No previous ECGs available Confirmed by Edd Fabian [phone]) on 03/04/2023 1:42:29 PM   EKG 1/24 Sinus rhythm with occasional PVCs 65 bpm septal infarct undetermined age  66/09/2019 CTA with FFR -  1. Left Main: normal   2. LAD: Proximal 0.94, mid after first diagonal 0.80, distal 0.79   3. LCX: Proximal 0.95, distal 0.90   4. Ramus: n/a   5. RCA: Proximal 0.99, distal 0.91   IMPRESSION: Borderline mid LAD flow analysis. Would recommend continued aggressive risk factor modification and if symptoms persist, consider cardiac catheterization.   08/18/2019 coronary CTA -  1. Coronary calcium score of 239. This was 11 percentile for age and sex matched control.   2. Normal coronary origin with right dominance.   3. Three vessel mixed calcified and non calcified plaque that appears predominantly non flow limiting. There is possible mid LAD 50-69% stenosis. Will send for FFR analysis.   4.  Aortic atherosclerosis and mild aortic valve sclerosis.        Assessment & Plan   1.  Coronary artery disease-reassuring coronary CTA 5/21.  Details above.  Denies exertional chest pain. Continue current medical therapy Heart healthy low-sodium high-fiber diet Maintain physical activity No plans for ischemic evaluation.  MSK chest pain-reports chest discomfort that is ongoing for several weeks.  Pain is intermittent.  Is doing physical therapy for neck injury.  Has been doing more upper extremity type activities.  Reviewed previous coronary CTA and well-controlled lipids.  Chest discomfort appears to be musculoskeletal versus precordial type pain. Cool and warm compresses Rest of her extremities May use Tylenol as  needed for muscle discomfort Reassurance provided  Palpitations-stable.  Does note occasional extra beats. Avoid triggers caffeine, chocolate, EtOH, dehydration etc. Continue atenolol  Hyperlipidemia-LDL 48 on 12/19/22. Continue aspirin, rosuvastatin High-fiber diet Maintain physical activity  Disposition:  Follow-up with Dr. Anne Fu or me in 1 year.   Thomasene Ripple. Catlin Doria NP-C     03/04/2023, 2:01 PM [facility] [address] Office [phone] Fax [phone]    I spent 14 minutes examining this patient, reviewing medications, and using patient centered shared decision making involving her cardiac care.   I spent greater than 20 minutes reviewing her past medical history,  medications, and prior cardiac tests.

## 2023-03-04 ENCOUNTER — Encounter: Payer: Self-pay | Admitting: General Practice

## 2023-03-04 ENCOUNTER — Ambulatory Visit: Payer: Medicare Other | Attending: General Practice | Admitting: General Practice

## 2023-03-04 VITALS — BP 124/58 | HR 69 | Ht 61.0 in | Wt 139.0 lb

## 2023-03-04 DIAGNOSIS — R0789 Other chest pain: Secondary | ICD-10-CM

## 2023-03-04 DIAGNOSIS — I25119 Atherosclerotic heart disease of native coronary artery with unspecified angina pectoris: Secondary | ICD-10-CM

## 2023-03-04 DIAGNOSIS — E782 Mixed hyperlipidemia: Secondary | ICD-10-CM | POA: Diagnosis not present

## 2023-03-04 DIAGNOSIS — R002 Palpitations: Secondary | ICD-10-CM

## 2023-03-04 NOTE — Patient Instructions (Signed)
Medication Instructions:   May use Tylenol  for your discomfort -  rest upper extremities  and use warm/cool  compress alternate   *If you need a refill on your cardiac medications before your next appointment, please call your pharmacy*   Lab Work: Not needed    Testing/Procedures: Not needed   Follow-Up: At [facility], you and your health needs are our priority.  As part of our continuing mission to provide you with exceptional heart care, we have created designated Provider Care Teams.  These Care Teams include your primary Cardiologist (physician) and Advanced Practice Providers (APPs -  Physician Assistants and Nurse Practitioners) who all work together to provide you with the care you need, when you need it.     Your next appointment:   9 to 12  month(s)  July to Nov 2025)   The format for your next appointment:   In Person  Provider:   Donato Schultz, MD  or Edd Fabian, FNP

## 2023-03-06 ENCOUNTER — Encounter: Payer: Medicare Other | Admitting: Physical Therapy

## 2023-03-09 ENCOUNTER — Encounter: Payer: Self-pay | Admitting: Physical Therapy

## 2023-03-09 ENCOUNTER — Ambulatory Visit: Payer: Medicare Other | Admitting: Physical Therapy

## 2023-03-09 DIAGNOSIS — G8929 Other chronic pain: Secondary | ICD-10-CM

## 2023-03-09 DIAGNOSIS — M6281 Muscle weakness (generalized): Secondary | ICD-10-CM

## 2023-03-09 DIAGNOSIS — M25611 Stiffness of right shoulder, not elsewhere classified: Secondary | ICD-10-CM | POA: Diagnosis not present

## 2023-03-09 DIAGNOSIS — M25511 Pain in right shoulder: Secondary | ICD-10-CM | POA: Diagnosis not present

## 2023-03-09 DIAGNOSIS — H53483 Generalized contraction of visual field, bilateral: Secondary | ICD-10-CM | POA: Diagnosis not present

## 2023-03-09 NOTE — Therapy (Signed)
OUTPATIENT PHYSICAL THERAPY SHOULDER   Patient Name: Toni Rich MRN: [id number] DOB:14-Feb-1946, 77 y.o., female Today's Date: 03/09/2023  END OF SESSION:  PT End of Session - 03/09/23 0935     Visit Number 8    Number of Visits 20    Authorization Type [employer] approved 16 visits up to 03/18/23    Authorization - Number of Visits 16    Progress Note Due on Visit 10    PT Start Time 0931    PT Stop Time 1009    PT Time Calculation (min) 38 min    Activity Tolerance Patient tolerated treatment well    Behavior During Therapy [facility] for tasks assessed/performed                Past Medical History:  Diagnosis Date   Anxiety    Colonic polyp    Depression    Hypercholesteremia    Hypothyroid    IBS (irritable bowel syndrome)    Migraines    Palpitations    Peripheral neuropathy    Past Surgical History:  Procedure Laterality Date   CESAREAN SECTION     TUBAL LIGATION     Patient Active Problem List   Diagnosis Date Noted   Coronary artery disease involving native coronary artery of native heart with angina pectoris (HCC) 03/25/2021   Mixed hyperlipidemia 03/25/2021   Right thigh pain 03/25/2019   Right knee pain 10/28/2018   Primary osteoarthritis of first carpometacarpal joint of left hand 10/28/2018   Hypokalemia 12/17/2011   Bronchitis 12/17/2011   Bilateral atelectasis 12/15/2011   Colitis 12/13/2011   Leukocytosis 12/13/2011    PCP: Sigmund Hazel, MD   REFERRING PROVIDER: Madelyn Brunner, DO   REFERRING DIAG:  Diagnosis  M25.511,G89.29 (ICD-10-CM) - Chronic right shoulder pain  M19.011 (ICD-10-CM) - Arthritis of right acromioclavicular joint    THERAPY DIAG:  Chronic right shoulder pain  Muscle weakness (generalized)  Stiffness of right shoulder, not elsewhere classified  Rationale for Evaluation and Treatment: Rehabilitation  ONSET DATE:   SUBJECTIVE:                                                                                                                                                                                       SUBJECTIVE STATEMENT: Pt still reporting pain of 5/10. Pt feels the DN and manual therapy is working but her pain returns.     PERTINENT HISTORY: She did undergo ultrasound-guided right AC joint injection on 11/11/2022, and then subsequent GHJ injection on 12/10/22.  Her left shoulder pain and biceps related pain essentially resolved after glenohumeral joint injection.  Her motion is improved as well but  she is still having pain over the top of the shoulder, this has worsened over the last few weeks.  She did receive relief after the [facility] joint injection but this has returned unfortunately.  She was taking Tylenol, was also prescribed tramadol 50 mg to take twice daily as needed.  PMH: anxiety, depression, IBS, peripheral neuropathy, migraines, hypothyroid, C-section, tubal ligation  PAIN:  NPRS scale: 5/10 upon arrival Pain location: Rt shoulder, anterior mostly Pain description: throbbing Aggravating factors: sleeping Relieving factors: tylenol  PRECAUTIONS: None  WEIGHT BEARING RESTRICTIONS: No  FALLS:  Has patient fallen in last 6 months? 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 a few days a week  PLOF: Independent  PATIENT GOALS:Stop hurting  Next MD visit:   OBJECTIVE:   DIAGNOSTIC FINDINGS: 11/11/22 X-rays demonstrate moderate degenerative changes to the [facility] joint   PATIENT SURVEYS:  01/21/23 FOTO intake: 66%  03/03/23:  FOTO update 69%  COGNITION: Overall cognitive status: WFL     SENSATION: WFL  POSTURE: Rounded head, forward shoulder, kyphosis  UPPER EXTREMITY ROM:   ROM Right 01/21/23 Left 01/21/23  Shoulder flexion 105 150   Shoulder extension 30 40  Shoulder abduction 130 160  Shoulder adduction    Shoulder internal rotation 70 68  Shoulder external rotation 75 60  Elbow flexion    Elbow extension    (Blank rows = not tested)  UPPER EXTREMITY MMT:  MMT Right 01/21/23 Left 01/21/23  Shoulder flexion 4 5  Shoulder extension 4 5  Shoulder abduction    Shoulder adduction    Shoulder internal rotation 4 5  Shoulder external rotation 4 5  Middle trapezius    Lower trapezius    Elbow flexion    Grip strength (lbs)    (Blank rows = not tested)  SHOULDER SPECIAL TESTS: Impingement tests: Neer impingement test: negative   PALPATION:  TTP: Rt anterior GH joint, Rt supraspinatus tendon, right upper trap                                                                                                                                                                                                   TODAY'S TREATMENT:  DATE: 03/09/23:  TherEx Rows: green TB x 20  Shoulder Ex c 1 # weight 2 x 10  Scapular stability rolling yellow physio-ball on the wall x 10 each direction on bil UEs Bil shoulder flexion c 2# bar 2 x 10  Door stretch: 90 deg x 3 holding 20 sec Manual:  Skilled palpation of active trigger points during TPDN Trigger Point Dry-Needling  Treatment instructions: Expect mild to moderate muscle soreness. S/S of pneumothorax if dry needled over a lung field, and to seek immediate medical attention should they occur. Patient verbalized understanding of these instructions and education.  Patient Consent Given: Yes Education handout provided: Previously provided Muscles treated: cervical paraspinals, sub occipitals, Rt upper trap Electrical stimulation performed: No Parameters: N/A Treatment response/outcome: multiple twitch response, pt reporting less stiffness at end of  session    03/03/23:  TherEx Rows: green TB x 20  Shoulder Ex c 1 # weight 2 x 10  Scapular retraction x 10 holding 3 sec Cervical rotation x 5 holding 5 sec bil  Levator stretch: x 3 bil holding 10 sec Manual:  Skilled palpation of active trigger points during TPDN Trigger Point Dry-Needling  Treatment instructions: Expect mild to moderate muscle soreness. S/S of pneumothorax if dry needled over a lung field, and to seek immediate medical attention should they occur. Patient verbalized understanding of these instructions and education.  Patient Consent Given: Yes Education handout provided: Previously provided Muscles treated: cervical paraspinals, sub occipitals Electrical stimulation performed: No Parameters: N/A Treatment response/outcome: multiple twitch response, pt reporting less stiffness at end of session     02/23/23:  TherEx:  Cervical rotation: x 3 holding  Cervical retraction: x 10 holding 3-5 seconds Bil shoulder flexion: x 10 rolling ball up the wall Rows: green TB x 20 holding 3 sec Manual:  Skilled palpation of active trigger points during TPDN Trigger Point Dry-Needling  Treatment instructions: Expect mild to moderate muscle soreness. S/S of pneumothorax if dry needled over a lung field, and to seek immediate medical attention should they occur. Patient verbalized understanding of these instructions and education.  Patient Consent Given: Yes Education handout provided: Previously provided Muscles treated: , Bil cervical paraspinals, bil sub occipitals Electrical stimulation performed: No Parameters: N/A Treatment response/outcome: twitch responses noted, pt with good tolerance      02/17/23:  Manual:  Skilled palpation of active trigger points during TPDN Trigger Point Dry-Needling  Treatment instructions: Expect mild to moderate muscle soreness. S/S of pneumothorax if dry needled over a lung field, and to seek immediate medical attention should  they occur. Patient verbalized understanding of these instructions and education.  Patient Consent Given: Yes Education handout provided: Previously provided Muscles treated: Rt upper trap, Rt cervical paraspinals Electrical stimulation performed: No Parameters: N/A Treatment response/outcome: twitch responses noted, pt with good tolerance  Traction:  Mechanical Traction: x 20 minutes Max pull: 15 # Min pull: 10# Modalities:  Moist heat: x 5 minutes     02/09/23:  TherEx:  Pulleys: flexion, scaption x 3 minutes each Rows: x 15 green TB, holding 3 sec Cervical retraction: x 5 holding 10 sec Traction:  Mechanical Traction: x 20 minutes Max pull: 12 # Min pull: 7# Modalities:  Moist heat: x 5 minutes       PATIENT EDUCATION: Education details: HEP, POC Person educated: Patient Education method: Programmer, multimedia, Demonstration, Verbal cues, and Handouts Education comprehension: verbalized understanding, returned demonstration, and verbal cues required  HOME EXERCISE PROGRAM: Access Code: [email or web address] URL: [email or web address] Date:  03/03/2023 Prepared by: Narda Amber  Exercises - Supine Shoulder Flexion Extension AAROM with Dowel  - 2 x daily - 7 x weekly - 10 reps - 2-3 seconds hold - Supine Shoulder External Rotation with Dowel  - 2 x daily - 7 x weekly - 10 reps - 2-3 seconds hold - Supine Shoulder Abduction AAROM with Dowel  - 2 x daily - 7 x weekly - 10 reps - 2-3 seconds hold - Standing Shoulder Row with Anchored Resistance  - 2 x daily - 7 x weekly - 2 sets - 10 reps - 3 seconds  hold - Seated Cervical Rotation AROM  - 1-2 x daily - 7 x weekly - 5 reps - 5-10 seconds hold - Seated Scapular Retraction  - 1-2 x daily - 7 x weekly - 10 reps - 5 seconds hold - Gentle Levator Scapulae Stretch  - 1-2 x daily - 7 x weekly - 5 reps - 5-10 seconds hold  ASSESSMENT:  CLINICAL IMPRESSION: Pt arriving today reporting 5/10 pain in her neck and upper trap.  Pt feels the manual therapy, exercise and DN seem to help but it's short term. Pt reporting consistency with her HEP. Pt with good tolerance to DN this visit. Recommending continued skilled Pt interventions.   OBJECTIVE IMPAIRMENTS: decreased mobility, decreased ROM, decreased strength, impaired UE functional use, postural dysfunction, and pain.   ACTIVITY LIMITATIONS: lifting, sleeping, dressing, reach over head, hygiene/grooming, and caring for others  PARTICIPATION LIMITATIONS: cleaning, laundry, community activity, and occupation  PERSONAL FACTORS: 3+ comorbidities: see pertinent history  are also affecting patient's functional outcome.   REHAB POTENTIAL: Good  CLINICAL DECISION MAKING: Stable/uncomplicated  EVALUATION COMPLEXITY: Low   GOALS: Goals reviewed with patient? Yes  SHORT TERM GOALS: (target date for Short term goals are 3 weeks 02/13/23)  1.Patient will demonstrate independent use of home exercise program to maintain progress from in clinic treatments. Goal status:MET 02/23/23  LONG TERM GOALS: (target dates for all long term goals are 10 weeks  04/03/23 )   1. Patient will demonstrate/report pain at worst less than or equal to 2/10 to facilitate minimal limitation in daily activity secondary to pain symptoms. Goal status: on-going 02/23/23   2. Patient will demonstrate independent use of home exercise program to facilitate ability to maintain/progress functional gains from skilled physical therapy services. Goal status: New   3. Patient will demonstrate FOTO outcome > or = 70 % to indicate reduced disability due to condition. Goal status: 0n-going 03/03/23   4.  Patient will demonstrate Rt UE MMT >/= 4/5 throughout to facilitate lifting, reaching, carrying at [facility] in daily activity.   Goal status: New   5.  Patient will be able to lift 10# using bil UE's from floor to counter height with no pain in her RT shoulder.    Goal status:on-going 02/23/23  6.  Pt  will improve her Rt shoulder flexion to >= 150 degrees actively in order to improve functional mobility.  Goal status: New     PLAN:  PT FREQUENCY: 1-2x/week  PT DURATION: 10 weeks  PLANNED INTERVENTIONS: Therapeutic exercises, Therapeutic activity, Neuro Muscular re-education, Balance training, Gait training, Patient/Family education, Joint mobilization, Stair training, DME instructions, Dry Needling, Electrical stimulation, Traction, Cryotherapy, vasopneumatic device Moist heat, Taping, Ultrasound, Ionotophoresis 4mg /ml Dexamethasone, and aquatic therapy ,Manual therapy.  All included unless contraindicated  PLAN FOR NEXT SESSION: manual STM for cerivcal spine and bil shoulders Rt>: left, shoulder ROM, shoulder strengthening as tolerated DN as needed  Narda Amber, PT, MPT 03/09/23 10:07 AM       Date of referral: 01/12/23 Referring provider: Madelyn Brunner Referring diagnosis?  M25.511,G89.29 (ICD-10-CM) - Chronic right shoulder pain  M19.011 (ICD-10-CM) - Arthritis of right acromioclavicular joint   Treatment diagnosis? (if different than referring diagnosis) M25.511, M62.82, M25.611  What was this (referring dx) caused by? Other: degenerative changes noted in imagining  Ashby Dawes of Condition: Initial Onset (within last 3 months)   Laterality: Rt  Current Functional Measure Score: FOTO 66%  Objective measurements identify impairments when they are compared to normal values, the uninvolved extremity, and prior level of function.  [x]  Yes  []  No  Objective assessment of functional ability: Minimal functional limitations   Briefly describe symptoms: Rt shoulder pain and limited ROM  How did symptoms start: no specific trauma noted  Average pain intensity:  Last 24 hours: 4/10  Past week: pain ranges from 4-7/10  How often does the pt experience symptoms? Constantly  How much have the symptoms interfered with usual daily activities? Moderately  How has  condition changed since care began at this facility? NA - initial visit  In general, how is the patients overall health? Good   BACK PAIN (STarT Back Screening Tool) No

## 2023-03-11 ENCOUNTER — Encounter: Payer: Medicare Other | Admitting: Physical Therapy

## 2023-03-16 ENCOUNTER — Encounter: Payer: Self-pay | Admitting: Physical Therapy

## 2023-03-16 ENCOUNTER — Ambulatory Visit: Payer: Medicare Other | Admitting: Physical Therapy

## 2023-03-16 DIAGNOSIS — E7800 Pure hypercholesterolemia, unspecified: Secondary | ICD-10-CM | POA: Diagnosis not present

## 2023-03-16 DIAGNOSIS — M25511 Pain in right shoulder: Secondary | ICD-10-CM | POA: Diagnosis not present

## 2023-03-16 DIAGNOSIS — M25611 Stiffness of right shoulder, not elsewhere classified: Secondary | ICD-10-CM

## 2023-03-16 DIAGNOSIS — M6281 Muscle weakness (generalized): Secondary | ICD-10-CM

## 2023-03-16 DIAGNOSIS — E039 Hypothyroidism, unspecified: Secondary | ICD-10-CM | POA: Diagnosis not present

## 2023-03-16 DIAGNOSIS — M8580 Other specified disorders of bone density and structure, unspecified site: Secondary | ICD-10-CM | POA: Diagnosis not present

## 2023-03-16 DIAGNOSIS — K219 Gastro-esophageal reflux disease without esophagitis: Secondary | ICD-10-CM | POA: Diagnosis not present

## 2023-03-16 DIAGNOSIS — G8929 Other chronic pain: Secondary | ICD-10-CM | POA: Diagnosis not present

## 2023-03-16 DIAGNOSIS — I25119 Atherosclerotic heart disease of native coronary artery with unspecified angina pectoris: Secondary | ICD-10-CM | POA: Diagnosis not present

## 2023-03-16 DIAGNOSIS — Z23 Encounter for immunization: Secondary | ICD-10-CM | POA: Diagnosis not present

## 2023-03-16 NOTE — Therapy (Signed)
OUTPATIENT PHYSICAL THERAPY SHOULDER   Patient Name: Toni Rich MRN: [id number] DOB:June 22, 1945, 77 y.o., female Today's Date: 03/16/2023  END OF SESSION:  PT End of Session - 03/16/23 1332     Visit Number 9    Number of Visits 20    Date for PT Re-Evaluation 04/03/23    Authorization Type [employer] approved 16 visits up to 03/18/23    Authorization - Number of Visits 16    Progress Note Due on Visit 10    PT Start Time 1328    PT Stop Time 1410    PT Time Calculation (min) 42 min    Activity Tolerance Patient tolerated treatment well    Behavior During Therapy WFL for tasks assessed/performed                 Past Medical History:  Diagnosis Date   Anxiety    Colonic polyp    Depression    Hypercholesteremia    Hypothyroid    IBS (irritable bowel syndrome)    Migraines    Palpitations    Peripheral neuropathy    Past Surgical History:  Procedure Laterality Date   CESAREAN SECTION     TUBAL LIGATION     Patient Active Problem List   Diagnosis Date Noted   Coronary artery disease involving native coronary artery of native heart with angina pectoris (HCC) 03/25/2021   Mixed hyperlipidemia 03/25/2021   Right thigh pain 03/25/2019   Right knee pain 10/28/2018   Primary osteoarthritis of first carpometacarpal joint of left hand 10/28/2018   Hypokalemia 12/17/2011   Bronchitis 12/17/2011   Bilateral atelectasis 12/15/2011   Colitis 12/13/2011   Leukocytosis 12/13/2011    PCP: Sigmund Hazel, MD   REFERRING PROVIDER: Madelyn Brunner, DO   REFERRING DIAG:  Diagnosis  M25.511,G89.29 (ICD-10-CM) - Chronic right shoulder pain  M19.011 (ICD-10-CM) - Arthritis of right acromioclavicular joint    THERAPY DIAG:  Chronic right shoulder pain  Muscle weakness (generalized)  Stiffness of right shoulder, not elsewhere classified  Rationale for Evaluation and Treatment: Rehabilitation  ONSET DATE:   SUBJECTIVE:                                                                                                                                                                                       SUBJECTIVE STATEMENT: Pt arriving in 4-5/10 pain in her Rt upper trap/shoulder. Pt stating she feels like she slept wrong last night.     PERTINENT HISTORY: She did undergo ultrasound-guided right AC joint injection on 11/11/2022, and then subsequent GHJ injection on 12/10/22.  Her left shoulder pain and biceps related pain essentially resolved after glenohumeral  joint injection.  Her motion is improved as well but she is still having pain over the top of the shoulder, this has worsened over the last few weeks.  She did receive relief after the [facility] joint injection but this has returned unfortunately.  She was taking Tylenol, was also prescribed tramadol 50 mg to take twice daily as needed.  PMH: anxiety, depression, IBS, peripheral neuropathy, migraines, hypothyroid, C-section, tubal ligation  PAIN:  NPRS scale: 4-5/10 Pain location: Rt shoulder, anterior mostly Pain description: throbbing Aggravating factors: sleeping Relieving factors: tylenol  PRECAUTIONS: None  WEIGHT BEARING RESTRICTIONS: No  FALLS:  Has patient fallen in last 6 months? 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  PLOF: Independent  PATIENT GOALS:Stop hurting  Next MD visit:   OBJECTIVE:   DIAGNOSTIC FINDINGS: 11/11/22 X-rays demonstrate moderate degenerative changes to the [facility] joint   PATIENT SURVEYS:  01/21/23 FOTO intake: 66%  03/03/23:  FOTO update 69%  COGNITION: Overall cognitive status: WFL     SENSATION: WFL  POSTURE: Rounded head, forward shoulder, kyphosis  UPPER EXTREMITY ROM:   ROM Right 01/21/23  Left 01/21/23  Shoulder flexion 105 150  Shoulder extension 30 40  Shoulder abduction 130 160  Shoulder adduction    Shoulder internal rotation 70 68  Shoulder external rotation 75 60  Elbow flexion    Elbow extension    (Blank rows = not tested)  UPPER EXTREMITY MMT:  MMT Right 01/21/23 Left 01/21/23 Right 03/16/23  Shoulder flexion 4 5 4+  Shoulder extension 4 5 5   Shoulder abduction     Shoulder adduction     Shoulder internal rotation 4 5 4+  Shoulder external rotation 4 5 4+  Middle trapezius     Lower trapezius     Elbow flexion     Grip strength (lbs)     (Blank rows = not tested)  SHOULDER SPECIAL TESTS: Impingement tests: Neer impingement test: negative   PALPATION:  TTP: Rt anterior GH joint, Rt supraspinatus tendon, right upper trap                                                                                                                                                                                                   TODAY'S TREATMENT:  DATE: 03/16/23:  TherEx UBE: level 3.5 x 3 minutes both directions Rows: green TB x 20 holding 3 sec Shoulder flexion: 2#  2 x 10  Shoulder abd: 2 # 2 x 10  Side lying shoulder abd: 1# 2 x 10  Manual:  Skilled palpation of active trigger points during TPDN STM using massage cream and Biofreeze  Trigger Point Dry-Needling  Treatment instructions: Expect mild to moderate muscle soreness. S/S of pneumothorax if dry needled over a lung field, and to seek immediate medical attention should they occur. Patient verbalized understanding of these instructions and education.  Patient Consent Given: Yes Education handout provided: Previously provided Muscles treated: bil cervical paraspinals and Rt upper trap Electrical stimulation performed: No Parameters: N/A Treatment response/outcome: multiple twitch response, pt  reporting less stiffness at end of session          03/09/23:  TherEx Rows: green TB x 20  Shoulder Ex c 1 # weight 2 x 10  Scapular stability rolling yellow physio-ball on the wall x 10 each direction on bil UEs Bil shoulder flexion c 2# bar 2 x 10  Door stretch: 90 deg x 3 holding 20 sec Manual:  Skilled palpation of active trigger points during TPDN Trigger Point Dry-Needling  Treatment instructions: Expect mild to moderate muscle soreness. S/S of pneumothorax if dry needled over a lung field, and to seek immediate medical attention should they occur. Patient verbalized understanding of these instructions and education.  Patient Consent Given: Yes Education handout provided: Previously provided Muscles treated: cervical paraspinals, sub occipitals, Rt upper trap Electrical stimulation performed: No Parameters: N/A Treatment response/outcome: multiple twitch response, pt reporting less stiffness at end of session    03/03/23:  TherEx Rows: green TB x 20  Shoulder Ex c 1 # weight 2 x 10  Scapular retraction x 10 holding 3 sec Cervical rotation x 5 holding 5 sec bil  Levator stretch: x 3 bil holding 10 sec Manual:  Skilled palpation of active trigger points during TPDN Trigger Point Dry-Needling  Treatment instructions: Expect mild to moderate muscle soreness. S/S of pneumothorax if dry needled over a lung field, and to seek immediate medical attention should they occur. Patient verbalized understanding of these instructions and education.  Patient Consent Given: Yes Education handout provided: Previously provided Muscles treated: cervical paraspinals, sub occipitals Electrical stimulation performed: No Parameters: N/A Treatment response/outcome: multiple twitch response, pt reporting less stiffness at end of session     02/23/23:  TherEx:  Cervical rotation: x 3 holding  Cervical retraction: x 10 holding 3-5 seconds Bil shoulder flexion: x 10 rolling ball up  the wall Rows: green TB x 20 holding 3 sec Manual:  Skilled palpation of active trigger points during TPDN Trigger Point Dry-Needling  Treatment instructions: Expect mild to moderate muscle soreness. S/S of pneumothorax if dry needled over a lung field, and to seek immediate medical attention should they occur. Patient verbalized understanding of these instructions and education.  Patient Consent Given: Yes Education handout provided: Previously provided Muscles treated: , Bil cervical paraspinals, bil sub occipitals Electrical stimulation performed: No Parameters: N/A Treatment response/outcome: twitch responses noted, pt with good tolerance       PATIENT EDUCATION: Education details: HEP, POC Person educated: Patient Education method: Programmer, multimedia, Demonstration, Verbal cues, and Handouts Education comprehension: verbalized understanding, returned demonstration, and verbal cues required  HOME EXERCISE PROGRAM: Access Code: [email or web address] URL: [email or web address] Date: 03/03/2023 Prepared by: Narda Amber  Exercises - Supine Shoulder Flexion Extension AAROM with Dowel  -  2 x daily - 7 x weekly - 10 reps - 2-3 seconds hold - Supine Shoulder External Rotation with Dowel  - 2 x daily - 7 x weekly - 10 reps - 2-3 seconds hold - Supine Shoulder Abduction AAROM with Dowel  - 2 x daily - 7 x weekly - 10 reps - 2-3 seconds hold - Standing Shoulder Row with Anchored Resistance  - 2 x daily - 7 x weekly - 2 sets - 10 reps - 3 seconds  hold - Seated Cervical Rotation AROM  - 1-2 x daily - 7 x weekly - 5 reps - 5-10 seconds hold - Seated Scapular Retraction  - 1-2 x daily - 7 x weekly - 10 reps - 5 seconds hold - Gentle Levator Scapulae Stretch  - 1-2 x daily - 7 x weekly - 5 reps - 5-10 seconds hold  ASSESSMENT:  CLINICAL IMPRESSION: Pt arriving today reporting 4-5/10 pain in her Rt upper trap and shoulder. Pt  tolerating all exercises well with improvements in overall Rt  UE strength. Following manual therapy pt reporting pain of 3/10. Continue skilled PT interventions.   OBJECTIVE IMPAIRMENTS: decreased mobility, decreased ROM, decreased strength, impaired UE functional use, postural dysfunction, and pain.   ACTIVITY LIMITATIONS: lifting, sleeping, dressing, reach over head, hygiene/grooming, and caring for others  PARTICIPATION LIMITATIONS: cleaning, laundry, community activity, and occupation  PERSONAL FACTORS: 3+ comorbidities: see pertinent history  are also affecting patient's functional outcome.   REHAB POTENTIAL: Good  CLINICAL DECISION MAKING: Stable/uncomplicated  EVALUATION COMPLEXITY: Low   GOALS: Goals reviewed with patient? Yes  SHORT TERM GOALS: (target date for Short term goals are 3 weeks 02/13/23)  1.Patient will demonstrate independent use of home exercise program to maintain progress from in clinic treatments. Goal status:MET 02/23/23  LONG TERM GOALS: (target dates for all long term goals are 10 weeks  04/03/23 )   1. Patient will demonstrate/report pain at worst less than or equal to 2/10 to facilitate minimal limitation in daily activity secondary to pain symptoms. Goal status: on-going 02/23/23   2. Patient will demonstrate independent use of home exercise program to facilitate ability to maintain/progress functional gains from skilled physical therapy services. Goal status: New   3. Patient will demonstrate FOTO outcome > or = 70 % to indicate reduced disability due to condition. Goal status: 0n-going 03/03/23   4.  Patient will demonstrate Rt UE MMT >/= 4/5 throughout to facilitate lifting, reaching, carrying at [facility] in daily activity.   Goal status: New   5.  Patient will be able to lift 10# using bil UE's from floor to counter height with no pain in her RT shoulder.    Goal status:on-going 02/23/23  6.  Pt will improve her Rt shoulder flexion to >= 150 degrees actively in order to improve functional mobility.   Goal status: New     PLAN:  PT FREQUENCY: 1-2x/week  PT DURATION: 10 weeks  PLANNED INTERVENTIONS: Therapeutic exercises, Therapeutic activity, Neuro Muscular re-education, Balance training, Gait training, Patient/Family education, Joint mobilization, Stair training, DME instructions, Dry Needling, Electrical stimulation, Traction, Cryotherapy, vasopneumatic device Moist heat, Taping, Ultrasound, Ionotophoresis 4mg /ml Dexamethasone, and aquatic therapy ,Manual therapy.  All included unless contraindicated  PLAN FOR NEXT SESSION: manual STM for cerivcal spine and bil shoulders Rt>: left, shoulder ROM, shoulder strengthening as tolerated DN as needed  FOTO and PN next visit.   Narda Amber, PT, MPT 03/16/23 2:17 PM       Date of referral: 01/12/23  Referring provider: Madelyn Brunner Referring diagnosis?  M25.511,G89.29 (ICD-10-CM) - Chronic right shoulder pain  M19.011 (ICD-10-CM) - Arthritis of right acromioclavicular joint   Treatment diagnosis? (if different than referring diagnosis) M25.511, M62.82, M25.611  What was this (referring dx) caused by? Other: degenerative changes noted in imagining  Ashby Dawes of Condition: Initial Onset (within last 3 months)   Laterality: Rt  Current Functional Measure Score: FOTO 66%  Objective measurements identify impairments when they are compared to normal values, the uninvolved extremity, and prior level of function.  [x]  Yes  []  No  Objective assessment of functional ability: Minimal functional limitations   Briefly describe symptoms: Rt shoulder pain and limited ROM  How did symptoms start: no specific trauma noted  Average pain intensity:  Last 24 hours: 4/10  Past week: pain ranges from 4-7/10  How often does the pt experience symptoms? Constantly  How much have the symptoms interfered with usual daily activities? Moderately  How has condition changed since care began at this facility? NA - initial visit  In general,  how is the patients overall health? Good   BACK PAIN (STarT Back Screening Tool) No

## 2023-03-18 ENCOUNTER — Encounter: Payer: Medicare Other | Admitting: Physical Therapy

## 2023-03-18 ENCOUNTER — Telehealth: Payer: Self-pay | Admitting: Physical Therapy

## 2023-03-18 DIAGNOSIS — H0279 Other degenerative disorders of eyelid and periocular area: Secondary | ICD-10-CM | POA: Diagnosis not present

## 2023-03-18 DIAGNOSIS — H02834 Dermatochalasis of left upper eyelid: Secondary | ICD-10-CM | POA: Diagnosis not present

## 2023-03-18 DIAGNOSIS — H02831 Dermatochalasis of right upper eyelid: Secondary | ICD-10-CM | POA: Diagnosis not present

## 2023-03-18 DIAGNOSIS — H53483 Generalized contraction of visual field, bilateral: Secondary | ICD-10-CM | POA: Diagnosis not present

## 2023-03-18 DIAGNOSIS — H02423 Myogenic ptosis of bilateral eyelids: Secondary | ICD-10-CM | POA: Diagnosis not present

## 2023-03-18 DIAGNOSIS — H02835 Dermatochalasis of left lower eyelid: Secondary | ICD-10-CM | POA: Diagnosis not present

## 2023-03-18 DIAGNOSIS — H57813 Brow ptosis, bilateral: Secondary | ICD-10-CM | POA: Diagnosis not present

## 2023-03-18 DIAGNOSIS — H02832 Dermatochalasis of right lower eyelid: Secondary | ICD-10-CM | POA: Diagnosis not present

## 2023-03-18 NOTE — Telephone Encounter (Signed)
No-show for today's PT visit. Called but got forwarded to voicemail- [facility] about missed visit today, also asked her to call front desk of clinic to schedule if she would like to continue as this was her last set appointment.   Nedra Hai, PT, DPT 03/18/23 1:21 PM

## 2023-03-22 ENCOUNTER — Other Ambulatory Visit: Payer: Self-pay | Admitting: Cardiology

## 2023-03-22 DIAGNOSIS — E7800 Pure hypercholesterolemia, unspecified: Secondary | ICD-10-CM

## 2023-03-22 DIAGNOSIS — Z8249 Family history of ischemic heart disease and other diseases of the circulatory system: Secondary | ICD-10-CM

## 2023-03-24 ENCOUNTER — Encounter: Payer: Medicare Other | Admitting: Physical Therapy

## 2023-04-03 DIAGNOSIS — K219 Gastro-esophageal reflux disease without esophagitis: Secondary | ICD-10-CM | POA: Diagnosis not present

## 2023-04-03 DIAGNOSIS — Z79899 Other long term (current) drug therapy: Secondary | ICD-10-CM | POA: Diagnosis not present

## 2023-04-03 DIAGNOSIS — Z860101 Personal history of adenomatous and serrated colon polyps: Secondary | ICD-10-CM | POA: Diagnosis not present

## 2023-04-06 ENCOUNTER — Encounter: Payer: Self-pay | Admitting: Physical Therapy

## 2023-04-06 ENCOUNTER — Ambulatory Visit: Payer: Medicare Other | Admitting: Physical Therapy

## 2023-04-06 DIAGNOSIS — M6281 Muscle weakness (generalized): Secondary | ICD-10-CM | POA: Diagnosis not present

## 2023-04-06 DIAGNOSIS — M25511 Pain in right shoulder: Secondary | ICD-10-CM

## 2023-04-06 DIAGNOSIS — M25611 Stiffness of right shoulder, not elsewhere classified: Secondary | ICD-10-CM | POA: Diagnosis not present

## 2023-04-06 DIAGNOSIS — G8929 Other chronic pain: Secondary | ICD-10-CM | POA: Diagnosis not present

## 2023-04-06 NOTE — Therapy (Signed)
OUTPATIENT PHYSICAL THERAPY SHOULDER Progress Note/ Re-certification   Patient Name: Toni Rich MRN: [id number] DOB:07-15-1945, 76 y.o., female Today's Date: 04/06/2023  Progress Note Reporting Period 01/21/23 to 04/06/2023   See note below for Objective Data and Assessment of Progress/Goals.      END OF SESSION:  PT End of Session - 04/06/23 1341     Visit Number 10    Number of Visits 20    Date for PT Re-Evaluation 06/01/23    Authorization Type [employer] approved 16 visits up to 03/18/23, resubmitted on 04/06/23 for 8 additional visits over 8 weeks.    Progress Note Due on Visit 20    PT Start Time 1345    PT Stop Time 1425    PT Time Calculation (min) 40 min    Activity Tolerance Patient tolerated treatment well    Behavior During Therapy WFL for tasks assessed/performed                  Past Medical History:  Diagnosis Date   Anxiety    Colonic polyp    Depression    Hypercholesteremia    Hypothyroid    IBS (irritable bowel syndrome)    Migraines    Palpitations    Peripheral neuropathy    Past Surgical History:  Procedure Laterality Date   CESAREAN SECTION     TUBAL LIGATION     Patient Active Problem List   Diagnosis Date Noted   Coronary artery disease involving native coronary artery of native heart with angina pectoris (HCC) 03/25/2021   Mixed hyperlipidemia 03/25/2021   Right thigh pain 03/25/2019   Right knee pain 10/28/2018   Primary osteoarthritis of first carpometacarpal joint of left hand 10/28/2018   Hypokalemia 12/17/2011   Bronchitis 12/17/2011   Bilateral atelectasis 12/15/2011   Colitis 12/13/2011   Leukocytosis 12/13/2011    PCP: Sigmund Hazel, MD   REFERRING PROVIDER: Madelyn Brunner, DO   REFERRING DIAG:  Diagnosis  M25.511,G89.29 (ICD-10-CM) - Chronic right shoulder pain  M19.011 (ICD-10-CM) - Arthritis of right acromioclavicular joint    THERAPY DIAG:  Chronic right shoulder pain  Muscle weakness  (generalized)  Stiffness of right shoulder, not elsewhere classified  Rationale for Evaluation and Treatment: Rehabilitation  ONSET DATE:   SUBJECTIVE:                                                                                                                                                                                      SUBJECTIVE STATEMENT: Pt arriving today reporting 3/10 in her neck and 5/10 in her Rt shoulder. Pt has not been to therapy since 03/16/23 due to scheduling conflicts. Pt  stating her HEP is going well when she is able to perform them.     PERTINENT HISTORY: She did undergo ultrasound-guided right AC joint injection on 11/11/2022, and then subsequent GHJ injection on 12/10/22.  Her left shoulder pain and biceps related pain essentially resolved after glenohumeral joint injection.  Her motion is improved as well but she is still having pain over the top of the shoulder, this has worsened over the last few weeks.  She did receive relief after the [facility] joint injection but this has returned unfortunately.  She was taking Tylenol, was also prescribed tramadol 50 mg to take twice daily as needed.  PMH: anxiety, depression, IBS, peripheral neuropathy, migraines, hypothyroid, C-section, tubal ligation  PAIN:  NPRS scale: 3/10 neck, 5/10 in Rt shoulder Pain location: Rt shoulder, anterior mostly Pain description: throbbing Aggravating factors: sleeping Relieving factors: tylenol  PRECAUTIONS: None  WEIGHT BEARING RESTRICTIONS: No  FALLS:  Has patient fallen in last 6 months? 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  PLOF: Independent  PATIENT GOALS:Stop hurting  Next MD visit:   OBJECTIVE:    DIAGNOSTIC FINDINGS: 11/11/22 X-rays demonstrate moderate degenerative changes to the [facility] joint   PATIENT SURVEYS:  01/21/23 FOTO intake: 66%  03/03/23:  FOTO update 69% 04/05/22:  FOTO update 62%  COGNITION: Overall cognitive status: WFL     SENSATION: WFL  POSTURE: Rounded head, forward shoulder, kyphosis  UPPER EXTREMITY ROM:   ROM Right 01/21/23 Left 01/21/23 Rt 04/06/23  Shoulder flexion 105 150 152  Shoulder extension 30 40 54  Shoulder abduction 130 160 160  Shoulder adduction     Shoulder internal rotation 70 68 68  Shoulder external rotation 75 60 75  Elbow flexion     Elbow extension     (Blank rows = not tested)  04/06/23:  Cervical ROM:  Flexion: 45 deg Extension: 20 deg Rt rotation: 45 deg Left Rotation: 52 deg Rt side bending: 22 deg Left side bending: 24 deg  UPPER EXTREMITY MMT:  MMT Right 01/21/23 Left 01/21/23 Right 03/16/23 Rt 04/06/23   Shoulder flexion 4 5 4+ 4+  Shoulder extension 4 5 5 5   Shoulder abduction      Shoulder adduction      Shoulder internal rotation 4 5 4+ 4+  Shoulder external rotation 4 5 4+ 4+  Middle trapezius      Lower trapezius      Elbow flexion      Grip strength (lbs)      (Blank rows = not tested)  SHOULDER SPECIAL TESTS: Impingement tests: Neer impingement test: negative   PALPATION:  TTP: Rt anterior GH joint, Rt supraspinatus tendon, right upper trap  04/06/23: TTP: Rt upper trap and Rt anterior deltoid  TODAY'S TREATMENT:                                                                                                        DATE: 04/06/23:  TherEx UBE: level 3  x 4 minutes both directions Scapular squeezes x 10 holding 5 Shoulder flexion: 2#  2 x 10  Seated cervical rotation: x 5 bil sides holding  5 sec end range Wall push ups: x 10  Manual:  Skilled palpation of active trigger points during TPDN Trigger Point Dry-Needling  Treatment instructions: Expect mild to moderate muscle soreness. S/S of pneumothorax if dry needled over a lung field, and to seek immediate medical attention should they occur. Patient verbalized understanding of these instructions and education.  Patient Consent Given: Yes Education handout provided: Previously provided Muscles treated: bil cervical paraspinals and Rt upper trap, Rt suboccipitals and Rt deltoid Electrical stimulation performed: No Parameters: N/A Treatment response/outcome: multiple twitch response, pt reporting less stiffness at end of session     TODAY'S TREATMENT:                                                                                                        DATE: 03/16/23:  TherEx UBE: level 3.5 x 3 minutes both directions Rows: green TB x 20 holding 3 sec Shoulder flexion: 2#  2 x 10  Shoulder abd: 2 # 2 x 10  Side lying shoulder abd: 1# 2 x 10  Manual:  Skilled palpation of active trigger points during TPDN STM using massage cream and Biofreeze  Trigger Point Dry-Needling  Treatment instructions: Expect mild to moderate muscle soreness. S/S of pneumothorax if dry needled over a lung field, and to seek immediate medical attention should they occur. Patient verbalized understanding of these instructions and education.  Patient Consent Given: Yes Education handout provided: Previously provided Muscles treated: bil cervical paraspinals and Rt upper trap Electrical stimulation performed: No Parameters: N/A Treatment response/outcome: multiple twitch response, pt reporting less stiffness at end of session    03/09/23:  TherEx Rows: green TB x 20  Shoulder Ex c 1 # weight 2 x 10  Scapular stability rolling yellow physio-ball on the wall x 10 each direction on bil UEs Bil shoulder flexion c 2# bar 2 x 10  Door stretch:  90 deg x 3 holding 20 sec Manual:  Skilled palpation of active trigger points during TPDN Trigger Point Dry-Needling  Treatment instructions: Expect mild to moderate muscle soreness. S/S of pneumothorax if dry needled over a lung field, and to seek immediate medical attention should they occur. Patient verbalized understanding of these instructions and education.  Patient Consent  Given: Yes Education handout provided: Previously provided Muscles treated: cervical paraspinals, sub occipitals, Rt upper trap Electrical stimulation performed: No Parameters: N/A Treatment response/outcome: multiple twitch response, pt reporting less stiffness at end of session       PATIENT EDUCATION: Education details: HEP, POC Person educated: Patient Education method: Programmer, multimedia, Demonstration, Verbal cues, and Handouts Education comprehension: verbalized understanding, returned demonstration, and verbal cues required  HOME EXERCISE PROGRAM: Access Code: [email or web address] URL: [email or web address] Date: 03/03/2023 Prepared by: Narda Amber  Exercises - Supine Shoulder Flexion Extension AAROM with Dowel  - 2 x daily - 7 x weekly - 10 reps - 2-3 seconds hold - Supine Shoulder External Rotation with Dowel  - 2 x daily - 7 x weekly - 10 reps - 2-3 seconds hold - Supine Shoulder Abduction AAROM with Dowel  - 2 x daily - 7 x weekly - 10 reps - 2-3 seconds hold - Standing Shoulder Row with Anchored Resistance  - 2 x daily - 7 x weekly - 2 sets - 10 reps - 3 seconds  hold - Seated Cervical Rotation AROM  - 1-2 x daily - 7 x weekly - 5 reps - 5-10 seconds hold - Seated Scapular Retraction  - 1-2 x daily - 7 x weekly - 10 reps - 5 seconds hold - Gentle Levator Scapulae Stretch  - 1-2 x daily - 7 x weekly - 5 reps - 5-10 seconds hold  ASSESSMENT:  CLINICAL IMPRESSION: Pt arriving today still reporting pain in her cervical spine and Rt shoulder. Pt with improvements in ROM in her Rt shoulder  since initial evaluation. Pt is still progressing toward 5/5 strength in her Rt UE for improved functional mobility. Pt has met 2 of her original 6 LTG's. I have added 2 additional goals to pt's treatment plan to help pt maximize pt's function. I am requesting 8 additional visits for 1x/ week for pt to progress toward all goals not met.      OBJECTIVE IMPAIRMENTS: decreased mobility, decreased ROM, decreased strength, impaired UE functional use, postural dysfunction, and pain.   ACTIVITY LIMITATIONS: lifting, sleeping, dressing, reach over head, hygiene/grooming, and caring for others  PARTICIPATION LIMITATIONS: cleaning, laundry, community activity, and occupation  PERSONAL FACTORS: 3+ comorbidities: see pertinent history  are also affecting patient's functional outcome.   REHAB POTENTIAL: Good  CLINICAL DECISION MAKING: Stable/uncomplicated  EVALUATION COMPLEXITY: Low   GOALS: Goals reviewed with patient? Yes  SHORT TERM GOALS: (target date for Short term goals are 3 weeks 02/13/23)  1.Patient will demonstrate independent use of home exercise program to maintain progress from in clinic treatments. Goal status: MET 02/23/23  LONG TERM GOALS: (target dates for all long term goals are 10 weeks 06/01/2023)   1. Patient will demonstrate/report pain at worst less than or equal to 2/10 to facilitate minimal limitation in daily activity secondary to pain symptoms. Goal status: on-going 04/06/23   2. Patient will demonstrate independent use of home exercise program to facilitate ability to maintain/progress functional gains from skilled physical therapy services. Goal status:  on-going 04/06/23   3. Patient will demonstrate FOTO outcome > or = 70 % to indicate reduced disability due to condition. Goal status: 0n-going 03/03/23   on-going 04/06/23  4.  Patient will demonstrate Rt UE MMT >/= 4/5 throughout to facilitate lifting, reaching, carrying at [facility] in daily activity.   Goal  status:  MET 04/06/23   5.  Patient will be able to lift 10# using bil UE's from floor to counter  height with no pain in her RT shoulder.    Goal status: on-going 04/06/23  6.  Pt will improve her Rt shoulder flexion to >= 150 degrees actively in order to improve functional mobility.  Goal status: MET 04/06/23   7. Pt will improve her Rt shoulder strength to 5/5 in order to improve functional activities.    Goal Status: New 8. Pt will improved bilateral cervical rotation to >/= 60 deg to improve driving safety and function.   Goal Status: New  PLAN:  PT FREQUENCY: 1-2x/week  PT DURATION: 10 weeks  PLANNED INTERVENTIONS: Therapeutic exercises, Therapeutic activity, Neuro Muscular re-education, Balance training, Gait training, Patient/Family education, Joint mobilization, Stair training, DME instructions, Dry Needling, Electrical stimulation, Traction, Cryotherapy, vasopneumatic device Moist heat, Taping, Ultrasound, Ionotophoresis 4mg /ml Dexamethasone, and aquatic therapy ,Manual therapy.  All included unless contraindicated  PLAN FOR NEXT SESSION: manual STM for cerivcal spine and bil shoulders Rt>: left, shoulder ROM, shoulder strengthening as tolerated DN as needed    Narda Amber, PT, MPT 04/06/23 2:38 PM       Date of referral: 01/12/23 Referring provider: Madelyn Brunner Referring diagnosis?  M25.511,G89.29 (ICD-10-CM) - Chronic right shoulder pain  M19.011 (ICD-10-CM) - Arthritis of right acromioclavicular joint   Treatment diagnosis? (if different than referring diagnosis) M25.511, M62.82, M25.611  What was this (referring dx) caused by? Other: degenerative changes noted in imagining  Ashby Dawes of Condition: Initial Onset (within last 3 months)   Laterality: Rt  Current Functional Measure Score: FOTO 66%  updated: 04/06/23 62% after not being at PT since 03/16/23.   Objective measurements identify impairments when they are compared to normal values, the  uninvolved extremity, and prior level of function.  [x]  Yes  []  No  Objective assessment of functional ability: Minimal functional limitations   Briefly describe symptoms: Rt shoulder pain and cervical pain and limited cervical ROM and Rt UE strength  How did symptoms start: no specific trauma noted  Average pain intensity:  Last 24 hours: 5/10  Past week: pain ranges from 2-7/10  How often does the pt experience symptoms? Constantly  How much have the symptoms interfered with usual daily activities? Moderately  How has condition changed since care began at this facility? NA - initial visit  In general, how is the patients overall health? Good   BACK PAIN (STarT Back Screening Tool) No

## 2023-04-10 ENCOUNTER — Telehealth: Payer: Self-pay | Admitting: Sports Medicine

## 2023-04-10 NOTE — Telephone Encounter (Signed)
I called and advised that the entire shoulder was xrayed, she states that she has been picking some things and having the same pain in her shoulder, I advised her that she should be resting the shoulder and if the pain still there that she needs to come in and be re-evaluated and they may to get an MRI to look at it further. She states that she understands and she will go to her PT appt on Monday and see what happens there.

## 2023-04-10 NOTE — Telephone Encounter (Signed)
PT CALLED REQUESTING A CALL FROM DR BROOKS ABOUT HER SHOULDER PAINS. SHE IS ASKING IF HER WHOLE SHOULDER WAS XRAYED BECAUSE SHE STILL HAS LOTS OF PAIN. PT PHONE NUMBER IS [phone]

## 2023-04-13 ENCOUNTER — Ambulatory Visit: Payer: Medicare Other | Admitting: Physical Therapy

## 2023-04-13 ENCOUNTER — Encounter: Payer: Self-pay | Admitting: Sports Medicine

## 2023-04-13 ENCOUNTER — Encounter: Payer: Self-pay | Admitting: Physical Therapy

## 2023-04-13 DIAGNOSIS — M25511 Pain in right shoulder: Secondary | ICD-10-CM

## 2023-04-13 DIAGNOSIS — M6281 Muscle weakness (generalized): Secondary | ICD-10-CM

## 2023-04-13 DIAGNOSIS — M25611 Stiffness of right shoulder, not elsewhere classified: Secondary | ICD-10-CM

## 2023-04-13 DIAGNOSIS — G8929 Other chronic pain: Secondary | ICD-10-CM | POA: Diagnosis not present

## 2023-04-13 NOTE — Therapy (Signed)
OUTPATIENT PHYSICAL THERAPY SHOULDER    Patient Name: Toni Rich MRN: [id number] DOB:1945/11/08, 77 y.o., female Today's Date: 04/13/2023       END OF SESSION:  PT End of Session - 04/13/23 0826     Visit Number 11    Number of Visits 20    Date for PT Re-Evaluation 06/01/23    Authorization Type [employer] approved 16 visits up to 03/18/23, resubmitted on 04/06/23 for 8 additional visits over 8 weeks.    Authorization - Number of Visits 16    Progress Note Due on Visit 20    PT Start Time 0828    PT Stop Time 0915    PT Time Calculation (min) 47 min    Behavior During Therapy [facility] for tasks assessed/performed                  Past Medical History:  Diagnosis Date   Anxiety    Colonic polyp    Depression    Hypercholesteremia    Hypothyroid    IBS (irritable bowel syndrome)    Migraines    Palpitations    Peripheral neuropathy    Past Surgical History:  Procedure Laterality Date   CESAREAN SECTION     TUBAL LIGATION     Patient Active Problem List   Diagnosis Date Noted   Coronary artery disease involving native coronary artery of native heart with angina pectoris (HCC) 03/25/2021   Mixed hyperlipidemia 03/25/2021   Right thigh pain 03/25/2019   Right knee pain 10/28/2018   Primary osteoarthritis of first carpometacarpal joint of left hand 10/28/2018   Hypokalemia 12/17/2011   Bronchitis 12/17/2011   Bilateral atelectasis 12/15/2011   Colitis 12/13/2011   Leukocytosis 12/13/2011    PCP: Sigmund Hazel, MD   REFERRING PROVIDER: Madelyn Brunner, DO   REFERRING DIAG:  Diagnosis  M25.511,G89.29 (ICD-10-CM) - Chronic right shoulder pain  M19.011 (ICD-10-CM) - Arthritis of right acromioclavicular joint    THERAPY DIAG:  Chronic right shoulder pain  Muscle weakness (generalized)  Stiffness of right shoulder, not elsewhere classified  Rationale for Evaluation and Treatment: Rehabilitation  ONSET DATE:   SUBJECTIVE:                                                                                                                                                                                       SUBJECTIVE STATEMENT: Pt reporting 6/10 pain in her cervical spine today.     PERTINENT HISTORY: She did undergo ultrasound-guided right AC joint injection on 11/11/2022, and then subsequent GHJ injection on 12/10/22.  Her left shoulder pain and biceps related pain essentially resolved after glenohumeral joint injection.  Her motion is improved as well but she is still having pain over the top of the shoulder, this has worsened over the last few weeks.  She did receive relief after the [facility] joint injection but this has returned unfortunately.  She was taking Tylenol, was also prescribed tramadol 50 mg to take twice daily as needed.  PMH: anxiety, depression, IBS, peripheral neuropathy, migraines, hypothyroid, C-section, tubal ligation  PAIN:  NPRS scale: 6/10 pain in neck Pain location: Rt shoulder, anterior mostly Pain description: throbbing Aggravating factors: sleeping Relieving factors: tylenol  PRECAUTIONS: None  WEIGHT BEARING RESTRICTIONS: No  FALLS:  Has patient fallen in last 6 months? 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  PLOF: Independent  PATIENT GOALS:Stop hurting  Next MD visit:   OBJECTIVE:   DIAGNOSTIC FINDINGS: 11/11/22 X-rays demonstrate moderate degenerative changes to the [facility] joint   PATIENT SURVEYS:  01/21/23 FOTO intake: 66%  03/03/23:  FOTO update 69% 04/05/22:  FOTO update 62%  COGNITION: Overall cognitive status: WFL     SENSATION: WFL  POSTURE: Rounded head, forward shoulder, kyphosis  UPPER EXTREMITY ROM:   ROM  Right 01/21/23 Left 01/21/23 Rt 04/06/23  Shoulder flexion 105 150 152  Shoulder extension 30 40 54  Shoulder abduction 130 160 160  Shoulder adduction     Shoulder internal rotation 70 68 68  Shoulder external rotation 75 60 75  Elbow flexion     Elbow extension     (Blank rows = not tested)  04/06/23:  Cervical ROM:  Flexion: 45 deg Extension: 20 deg Rt rotation: 45 deg Left Rotation: 52 deg Rt side bending: 22 deg Left side bending: 24 deg  UPPER EXTREMITY MMT:  MMT Right 01/21/23 Left 01/21/23 Right 03/16/23 Rt 04/06/23   Shoulder flexion 4 5 4+ 4+  Shoulder extension 4 5 5 5   Shoulder abduction      Shoulder adduction      Shoulder internal rotation 4 5 4+ 4+  Shoulder external rotation 4 5 4+ 4+  Middle trapezius      Lower trapezius      Elbow flexion      Grip strength (lbs)      (Blank rows = not tested)  SHOULDER SPECIAL TESTS: Impingement tests: Neer impingement test: negative   PALPATION:  TTP: Rt anterior GH joint, Rt supraspinatus tendon, right upper trap  04/06/23: TTP: Rt upper trap and Rt anterior deltoid  TODAY'S TREATMENT:                                                                                                        DATE: 04/13/23:  TherEx UBE: level 3  x 2 minutes both directions Rows: level 3 band 2 x 15 R shoulder ER and IR 2 x 15 level 2 band Lifting 2# weight into 2nd shelf cabinet x 10  Rolling 2 # ball up and down and then side to side on the wall for scapular stabilization x 20 bil UE's Door stretch: x 2 at 90 deg holding  Manual:  Skilled palpation of active trigger points during TPDN Trigger Point Dry-Needling  Treatment instructions: Expect mild to moderate muscle soreness. S/S of pneumothorax if dry needled over a lung field,  and to seek immediate medical attention should they occur. Patient verbalized understanding of these instructions and education.  Patient Consent Given: Yes Education handout provided: Previously provided Muscles treated: rt  cervical paraspinals and Rt upper trap, Rt suboccipitals  Electrical stimulation performed: No Parameters: N/A Treatment response/outcome: multiple twitch response, pt reporting less stiffness at end of session   TODAY'S TREATMENT:                                                                                                        DATE: 04/06/23:  TherEx UBE: level 3  x 4 minutes both directions Scapular squeezes x 10 holding 5 Shoulder flexion: 2#  2 x 10  Seated cervical rotation: x 5 bil sides holding 5 sec end range Wall push ups: x 10  Manual:  Skilled palpation of active trigger points during TPDN Trigger Point Dry-Needling  Treatment instructions: Expect mild to moderate muscle soreness. S/S of pneumothorax if dry needled over a lung field, and to seek immediate medical attention should they occur. Patient verbalized understanding of these instructions and education.  Patient Consent Given: Yes Education handout provided: Previously provided Muscles treated: bil cervical paraspinals and Rt upper trap, Rt suboccipitals and Rt deltoid Electrical stimulation performed: No Parameters: N/A Treatment response/outcome: multiple twitch response, pt reporting less stiffness at end of session Modalities:  Moist heat x 5 minutes following TPDN     TODAY'S TREATMENT:  DATE: 03/16/23:  TherEx UBE: level 3.5 x 3 minutes both directions Rows: green TB x 20 holding 3 sec Shoulder flexion: 2#  2 x 10  Shoulder abd: 2 # 2 x 10  Side lying shoulder abd: 1# 2 x 10  Manual:  Skilled palpation of active trigger points during TPDN STM using massage cream and  Biofreeze  Trigger Point Dry-Needling  Treatment instructions: Expect mild to moderate muscle soreness. S/S of pneumothorax if dry needled over a lung field, and to seek immediate medical attention should they occur. Patient verbalized understanding of these instructions and education.  Patient Consent Given: Yes Education handout provided: Previously provided Muscles treated: bil cervical paraspinals and Rt upper trap Electrical stimulation performed: No Parameters: N/A Treatment response/outcome: multiple twitch response, pt reporting less stiffness at end of session           PATIENT EDUCATION: Education details: HEP, POC Person educated: Patient Education method: Programmer, multimedia, Demonstration, Verbal cues, and Handouts Education comprehension: verbalized understanding, returned demonstration, and verbal cues required  HOME EXERCISE PROGRAM: Access Code: [email or web address] URL: [email or web address] Date: 03/03/2023 Prepared by: Narda Amber  Exercises - Supine Shoulder Flexion Extension AAROM with Dowel  - 2 x daily - 7 x weekly - 10 reps - 2-3 seconds hold - Supine Shoulder External Rotation with Dowel  - 2 x daily - 7 x weekly - 10 reps - 2-3 seconds hold - Supine Shoulder Abduction AAROM with Dowel  - 2 x daily - 7 x weekly - 10 reps - 2-3 seconds hold - Standing Shoulder Row with Anchored Resistance  - 2 x daily - 7 x weekly - 2 sets - 10 reps - 3 seconds  hold - Seated Cervical Rotation AROM  - 1-2 x daily - 7 x weekly - 5 reps - 5-10 seconds hold - Seated Scapular Retraction  - 1-2 x daily - 7 x weekly - 10 reps - 5 seconds hold - Gentle Levator Scapulae Stretch  - 1-2 x daily - 7 x weekly - 5 reps - 5-10 seconds hold  ASSESSMENT:  CLINICAL IMPRESSION: Pt arriving today with 6/10 pain in her neck. Pt with good response to strengthening exercises with low weights and postural exercises. Pt also continuing with manual therapy and TPDN. Recommending continued  skilled PT interventions as tolerated to maximize pt's function.      OBJECTIVE IMPAIRMENTS: decreased mobility, decreased ROM, decreased strength, impaired UE functional use, postural dysfunction, and pain.   ACTIVITY LIMITATIONS: lifting, sleeping, dressing, reach over head, hygiene/grooming, and caring for others  PARTICIPATION LIMITATIONS: cleaning, laundry, community activity, and occupation  PERSONAL FACTORS: 3+ comorbidities: see pertinent history  are also affecting patient's functional outcome.   REHAB POTENTIAL: Good  CLINICAL DECISION MAKING: Stable/uncomplicated  EVALUATION COMPLEXITY: Low   GOALS: Goals reviewed with patient? Yes  SHORT TERM GOALS: (target date for Short term goals are 3 weeks 02/13/23)  1.Patient will demonstrate independent use of home exercise program to maintain progress from in clinic treatments. Goal status: MET 02/23/23  LONG TERM GOALS: (target dates for all long term goals are 10 weeks 06/01/2023)   1. Patient will demonstrate/report pain at worst less than or equal to 2/10 to facilitate minimal limitation in daily activity secondary to pain symptoms. Goal status: on-going 04/06/23   2. Patient will demonstrate independent use of home exercise program to facilitate ability to maintain/progress functional gains from skilled physical therapy services. Goal status:  on-going 04/06/23   3. Patient will demonstrate FOTO outcome >  or = 70 % to indicate reduced disability due to condition. Goal status: 0n-going 03/03/23   on-going 04/06/23  4.  Patient will demonstrate Rt UE MMT >/= 4/5 throughout to facilitate lifting, reaching, carrying at [facility] in daily activity.   Goal status:  MET 04/06/23   5.  Patient will be able to lift 10# using bil UE's from floor to counter height with no pain in her RT shoulder.    Goal status: on-going 04/06/23  6.  Pt will improve her Rt shoulder flexion to >= 150 degrees actively in order to improve  functional mobility.  Goal status: MET 04/06/23   7. Pt will improve her Rt shoulder strength to 5/5 in order to improve functional activities.    Goal Status: New 8. Pt will improved bilateral cervical rotation to >/= 60 deg to improve driving safety and function.   Goal Status: New  PLAN:  PT FREQUENCY: 1-2x/week  PT DURATION: 10 weeks  PLANNED INTERVENTIONS: Therapeutic exercises, Therapeutic activity, Neuro Muscular re-education, Balance training, Gait training, Patient/Family education, Joint mobilization, Stair training, DME instructions, Dry Needling, Electrical stimulation, Traction, Cryotherapy, vasopneumatic device Moist heat, Taping, Ultrasound, Ionotophoresis 4mg /ml Dexamethasone, and aquatic therapy ,Manual therapy.  All included unless contraindicated  PLAN FOR NEXT SESSION: continue postural exercises, funcitonal shoulder strengthening as tolerated DN and manual as needed.     Narda Amber, PT, MPT 04/13/23 9:16 AM       Date of referral: 01/12/23 Referring provider: Madelyn Brunner Referring diagnosis?  M25.511,G89.29 (ICD-10-CM) - Chronic right shoulder pain  M19.011 (ICD-10-CM) - Arthritis of right acromioclavicular joint   Treatment diagnosis? (if different than referring diagnosis) M25.511, M62.82, M25.611  What was this (referring dx) caused by? Other: degenerative changes noted in imagining  Ashby Dawes of Condition: Initial Onset (within last 3 months)   Laterality: Rt  Current Functional Measure Score: FOTO 66%  updated: 04/06/23 62% after not being at PT since 03/16/23.   Objective measurements identify impairments when they are compared to normal values, the uninvolved extremity, and prior level of function.  [x]  Yes  []  No  Objective assessment of functional ability: Minimal functional limitations   Briefly describe symptoms: Rt shoulder pain and cervical pain and limited cervical ROM and Rt UE strength  How did symptoms start: no specific  trauma noted  Average pain intensity:  Last 24 hours: 5/10  Past week: pain ranges from 2-7/10  How often does the pt experience symptoms? Constantly  How much have the symptoms interfered with usual daily activities? Moderately  How has condition changed since care began at this facility? NA - initial visit  In general, how is the patients overall health? Good   BACK PAIN (STarT Back Screening Tool) No

## 2023-04-22 ENCOUNTER — Ambulatory Visit: Payer: Medicare Other | Admitting: Physical Therapy

## 2023-04-22 ENCOUNTER — Encounter: Payer: Self-pay | Admitting: Physical Therapy

## 2023-04-22 DIAGNOSIS — G8929 Other chronic pain: Secondary | ICD-10-CM

## 2023-04-22 DIAGNOSIS — M25611 Stiffness of right shoulder, not elsewhere classified: Secondary | ICD-10-CM

## 2023-04-22 DIAGNOSIS — M25511 Pain in right shoulder: Secondary | ICD-10-CM

## 2023-04-22 DIAGNOSIS — M6281 Muscle weakness (generalized): Secondary | ICD-10-CM | POA: Diagnosis not present

## 2023-04-22 NOTE — Therapy (Addendum)
 OUTPATIENT PHYSICAL THERAPY SHOULDER Discharge    Patient Name: Toni Rich MRN: [id number] DOB:[birth year], 78 y.o., female Today's Date: 04/22/2023       END OF SESSION:  PT End of Session - 04/22/23 0935     Visit Number 12    Number of Visits 20    Date for PT Re-Evaluation 06/01/23    Authorization Type [employer] approved 16 visits up to 03/18/23, approved for 8 more visit until 06/01/23    Progress Note Due on Visit 20    PT Start Time 0930    PT Stop Time 1010    PT Time Calculation (min) 40 min    Activity Tolerance Patient tolerated treatment well    Behavior During Therapy [facility] for tasks assessed/performed                  Past Medical History:  Diagnosis Date   Anxiety    Colonic polyp    Depression    Hypercholesteremia    Hypothyroid    IBS (irritable bowel syndrome)    Migraines    Palpitations    Peripheral neuropathy    Past Surgical History:  Procedure Laterality Date   CESAREAN SECTION     TUBAL LIGATION     Patient Active Problem List   Diagnosis Date Noted   Coronary artery disease involving native coronary artery of native heart with angina pectoris (HCC) 03/25/2021   Mixed hyperlipidemia 03/25/2021   Right thigh pain 03/25/2019   Right knee pain 10/28/2018   Primary osteoarthritis of first carpometacarpal joint of left hand 10/28/2018   Hypokalemia 12/17/2011   Bronchitis 12/17/2011   Bilateral atelectasis 12/15/2011   Colitis 12/13/2011   Leukocytosis 12/13/2011    PCP: Cleotilde Planas, MD   REFERRING PROVIDER: Burnetta Brunet, DO   REFERRING DIAG:  Diagnosis  M25.511,G89.29 (ICD-10-CM) - Chronic right shoulder pain  M19.011 (ICD-10-CM) - Arthritis of right acromioclavicular joint    THERAPY DIAG:  Chronic right shoulder pain  Muscle weakness (generalized)  Stiffness of right shoulder, not elsewhere classified  Rationale for Evaluation and Treatment: Rehabilitation  ONSET DATE:   SUBJECTIVE:                                                                                                                                                                                       SUBJECTIVE STATEMENT: Pt arriving today with 4/10 pain in her neck and 6/10 pain in her Rt shoulder. Pt feels DN has been helping. Pt also stating she purchased a new pillow which seems to help her sleeping.   PERTINENT HISTORY: She did undergo ultrasound-guided right AC joint injection on 11/11/2022,  and then subsequent GHJ injection on 12/10/22.  Her left shoulder pain and biceps related pain essentially resolved after glenohumeral joint injection.  Her motion is improved as well but she is still having pain over the top of the shoulder, this has worsened over the last few weeks.  She did receive relief after the [facility] joint injection but this has returned unfortunately.  She was taking Tylenol , was also prescribed tramadol  50 mg to take twice daily as needed.  PMH: anxiety, depression, IBS, peripheral neuropathy, migraines, hypothyroid, C-section, tubal ligation  PAIN:  NPRS scale: 4/10 pain in neck and 6/10 pain in Rt shoulder Pain location: Rt shoulder, anterior mostly Pain description: throbbing Aggravating factors: sleeping Relieving factors: tylenol   PRECAUTIONS: None  WEIGHT BEARING RESTRICTIONS: No  FALLS:  Has patient fallen in last 6 months? 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  PLOF: Independent  PATIENT GOALS:Stop hurting  Next MD visit:   OBJECTIVE:   DIAGNOSTIC FINDINGS: 11/11/22 X-rays demonstrate moderate degenerative changes to the [facility] joint   PATIENT SURVEYS:  01/21/23 FOTO intake: 66%  03/03/23:  FOTO update 69% 04/05/22:  FOTO update  62%  COGNITION: Overall cognitive status: WFL     SENSATION: WFL  POSTURE: Rounded head, forward shoulder, kyphosis  UPPER EXTREMITY ROM:   ROM Right 01/21/23 Left 01/21/23 Rt 04/06/23  Shoulder flexion 105 150 152  Shoulder extension 30 40 54  Shoulder abduction 130 160 160  Shoulder adduction     Shoulder internal rotation 70 68 68  Shoulder external rotation 75 60 75  Elbow flexion     Elbow extension     (Blank rows = not tested)  04/06/23:  Cervical ROM:  Flexion: 45 deg Extension: 20 deg Rt rotation: 45 deg Left Rotation: 52 deg Rt side bending: 22 deg Left side bending: 24 deg  UPPER EXTREMITY MMT:  MMT Right 01/21/23 Left 01/21/23 Right 03/16/23 Rt 04/06/23   Shoulder flexion 4 5 4+ 4+  Shoulder extension 4 5 5 5   Shoulder abduction      Shoulder adduction      Shoulder internal rotation 4 5 4+ 4+  Shoulder external rotation 4 5 4+ 4+  Middle trapezius      Lower trapezius      Elbow flexion      Grip strength (lbs)      (Blank rows = not tested)  SHOULDER SPECIAL TESTS: Impingement tests: Neer impingement test: negative   PALPATION:  TTP: Rt anterior GH joint, Rt supraspinatus tendon, right upper trap  04/06/23: TTP: Rt upper trap and Rt anterior deltoid  TODAY'S TREATMENT:                                                                                                        DATE: 04/22/23:  TherEx UBE: level 3  x 2 minutes both directions Rows: level 4 band 2 x 15 Shoulder ER Level 2 band x 15 bil UEs Wall push ups x 10  Wall angles 2 x 5 (trying to keep elbows against the wall ) Shoulder depression x 10 holding 5 sec Standing shoulder abd: x 10  Cervical rotation x 4 holding 5 sec bil  Upper trap stretch: x 2 holding 10 seconds Manual:   Skilled palpation of active trigger points during TPDN Trigger Point Dry-Needling  Treatment instructions: Expect mild to moderate muscle soreness. S/S of pneumothorax if dry needled over a lung field, and to seek immediate medical attention should they occur. Patient verbalized understanding of these instructions and education.  Patient Consent Given: Yes Education handout provided: Previously provided Muscles treated: Rt deltoids, Rt upper trap, Rt levator scapulae Electrical stimulation performed: No Parameters: N/A Treatment response/outcome: multiple twitch response, pt reporting less stiffness at end of session  Moist Heat: x 5 minutes following DN.        TODAY'S TREATMENT:                                                                                                        DATE: 04/13/23:  TherEx UBE: level 3  x 2 minutes both directions Rows: level 3 band 2 x 15 R shoulder ER and IR 2 x 15 level 2 band Lifting 2# weight into 2nd shelf cabinet x 10  Rolling 2 # ball up and down and then side to side on the wall for scapular stabilization x 20 bil UE's Door stretch: x 2 at 90 deg holding  Manual:  Skilled palpation of active trigger points during TPDN Trigger Point Dry-Needling  Treatment instructions: Expect mild to moderate muscle soreness. S/S of pneumothorax if dry needled over a lung field, and to seek immediate medical attention should they occur. Patient verbalized understanding of these instructions and education.  Patient Consent Given: Yes Education handout provided: Previously provided Muscles treated: rt  cervical paraspinals and Rt upper trap, Rt suboccipitals  Electrical stimulation performed: No Parameters: N/A Treatment response/outcome: multiple twitch response, pt reporting less stiffness at end of session   TODAY'S TREATMENT:  DATE: 04/06/23:   TherEx UBE: level 3  x 4 minutes both directions Scapular squeezes x 10 holding 5 Shoulder flexion: 2#  2 x 10  Seated cervical rotation: x 5 bil sides holding 5 sec end range Wall push ups: x 10  Manual:  Skilled palpation of active trigger points during TPDN Trigger Point Dry-Needling  Treatment instructions: Expect mild to moderate muscle soreness. S/S of pneumothorax if dry needled over a lung field, and to seek immediate medical attention should they occur. Patient verbalized understanding of these instructions and education.  Patient Consent Given: Yes Education handout provided: Previously provided Muscles treated: bil cervical paraspinals and Rt upper trap, Rt suboccipitals and Rt deltoid Electrical stimulation performed: No Parameters: N/A Treatment response/outcome: multiple twitch response, pt reporting less stiffness at end of session Modalities:  Moist heat x 5 minutes following TPDN     PATIENT EDUCATION: Education details: HEP, POC Person educated: Patient Education method: Programmer, multimedia, Demonstration, Verbal cues, and Handouts Education comprehension: verbalized understanding, returned demonstration, and verbal cues required  HOME EXERCISE PROGRAM: Access Code: [email or web address] URL: [email or web address] Date: 03/03/2023 Prepared by: Delon Lunger  Exercises - Supine Shoulder Flexion Extension AAROM with Dowel  - 2 x daily - 7 x weekly - 10 reps - 2-3 seconds hold - Supine Shoulder External Rotation with Dowel  - 2 x daily - 7 x weekly - 10 reps - 2-3 seconds hold - Supine Shoulder Abduction AAROM with Dowel  - 2 x daily - 7 x weekly - 10 reps - 2-3 seconds hold - Standing Shoulder Row with Anchored Resistance  - 2 x daily - 7 x weekly - 2 sets - 10 reps - 3 seconds  hold - Seated Cervical Rotation AROM  - 1-2 x daily - 7 x weekly - 5 reps - 5-10 seconds hold - Seated Scapular Retraction  - 1-2 x daily - 7 x weekly - 10 reps - 5 seconds hold - Gentle  Levator Scapulae Stretch  - 1-2 x daily - 7 x weekly - 5 reps - 5-10 seconds hold  ASSESSMENT:  CLINICAL IMPRESSION: Pt arriving to therapy today reporting her neck is feeling better, Pt reporting 6/10 pain in her shoulder. Pt tolerated treatment for shoulder and cervical spine strengthening as well as ROM. DN performed with twitch responses noted. Recommend continued skilled PT.      OBJECTIVE IMPAIRMENTS: decreased mobility, decreased ROM, decreased strength, impaired UE functional use, postural dysfunction, and pain.   ACTIVITY LIMITATIONS: lifting, sleeping, dressing, reach over head, hygiene/grooming, and caring for others  PARTICIPATION LIMITATIONS: cleaning, laundry, community activity, and occupation  PERSONAL FACTORS: 3+ comorbidities: see pertinent history are also affecting patient's functional outcome.   REHAB POTENTIAL: Good  CLINICAL DECISION MAKING: Stable/uncomplicated  EVALUATION COMPLEXITY: Low   GOALS: Goals reviewed with patient? Yes  SHORT TERM GOALS: (target date for Short term goals are 3 weeks 02/13/23)  1.Patient will demonstrate independent use of home exercise program to maintain progress from in clinic treatments. Goal status: MET 02/23/23  LONG TERM GOALS: (target dates for all long term goals are 10 weeks 06/01/2023)   1. Patient will demonstrate/report pain at worst less than or equal to 2/10 to facilitate minimal limitation in daily activity secondary to pain symptoms. Goal status: on-going 04/06/23   2. Patient will demonstrate independent use of home exercise program to facilitate ability to maintain/progress functional gains from skilled physical therapy services. Goal status:  on-going 04/06/23   3. Patient will demonstrate FOTO outcome >  or = 70 % to indicate reduced disability due to condition. Goal status: 0n-going 03/03/23   on-going 04/06/23  4.  Patient will demonstrate Rt UE MMT >/= 4/5 throughout to facilitate lifting, reaching,  carrying at [facility] in daily activity.   Goal status:  MET 04/06/23   5.  Patient will be able to lift 10# using bil UE's from floor to counter height with no pain in her RT shoulder.    Goal status: on-going 04/06/23  6.  Pt will improve her Rt shoulder flexion to >= 150 degrees actively in order to improve functional mobility.  Goal status: MET 04/06/23   7. Pt will improve her Rt shoulder strength to 5/5 in order to improve functional activities.    Goal Status: New 8. Pt will improved bilateral cervical rotation to >/= 60 deg to improve driving safety and function.   Goal Status: New  PLAN:  PT FREQUENCY: 1-2x/week  PT DURATION: 10 weeks  PLANNED INTERVENTIONS: Therapeutic exercises, Therapeutic activity, Neuro Muscular re-education, Balance training, Gait training, Patient/Family education, Joint mobilization, Stair training, DME instructions, Dry Needling, Electrical stimulation, Traction, Cryotherapy, vasopneumatic device Moist heat, Taping, Ultrasound, Ionotophoresis 4mg /ml Dexamethasone , and aquatic therapy ,Manual therapy.  All included unless contraindicated  PLAN FOR NEXT SESSION: continue postural exercises, funcitonal shoulder strengthening as tolerated DN and manual as needed.     Delon Lunger, PT, MPT 04/22/23 10:07 AM       Date of referral: 01/12/23 Referring provider: Burnetta Brunet Referring diagnosis?  M25.511,G89.29 (ICD-10-CM) - Chronic right shoulder pain  M19.011 (ICD-10-CM) - Arthritis of right acromioclavicular joint   Treatment diagnosis? (if different than referring diagnosis) M25.511, M62.82, M25.611  What was this (referring dx) caused by? Other: degenerative changes noted in imagining  Lysle of Condition: Initial Onset (within last 3 months)   Laterality: Rt  Current Functional Measure Score: FOTO 66% updated: 04/06/23 62% after not being at PT since 03/16/23.   Objective measurements identify impairments when they are compared to  normal values, the uninvolved extremity, and prior level of function.  [x]  Yes  []  No  Objective assessment of functional ability: Minimal functional limitations   Briefly describe symptoms: Rt shoulder pain and cervical pain and limited cervical ROM and Rt UE strength  How did symptoms start: no specific trauma noted  Average pain intensity:  Last 24 hours: 5/10  Past week: pain ranges from 2-7/10  How often does the pt experience symptoms? Constantly  How much have the symptoms interfered with usual daily activities? Moderately  How has condition changed since care began at this facility? NA - initial visit  In general, how is the patients overall health? Good   BACK PAIN (STarT Back Screening Tool) No   PHYSICAL THERAPY DISCHARGE SUMMARY  Visits from Start of Care: 12  Current functional level related to goals / functional outcomes: See above   Remaining deficits: See above   Education / Equipment: HEP   Patient agrees to discharge. Patient goals were partially met. Patient is being discharged due to not returning since the last visit.

## 2023-04-23 DIAGNOSIS — M533 Sacrococcygeal disorders, not elsewhere classified: Secondary | ICD-10-CM | POA: Diagnosis not present

## 2023-04-27 ENCOUNTER — Other Ambulatory Visit: Payer: Self-pay

## 2023-04-27 ENCOUNTER — Encounter: Payer: Self-pay | Admitting: Sports Medicine

## 2023-04-27 ENCOUNTER — Other Ambulatory Visit (INDEPENDENT_AMBULATORY_CARE_PROVIDER_SITE_OTHER): Payer: Medicare Other

## 2023-04-27 ENCOUNTER — Ambulatory Visit: Payer: Medicare Other | Admitting: Sports Medicine

## 2023-04-27 DIAGNOSIS — M533 Sacrococcygeal disorders, not elsewhere classified: Secondary | ICD-10-CM

## 2023-04-27 DIAGNOSIS — M7918 Myalgia, other site: Secondary | ICD-10-CM

## 2023-04-27 DIAGNOSIS — G8929 Other chronic pain: Secondary | ICD-10-CM | POA: Diagnosis not present

## 2023-04-27 DIAGNOSIS — M461 Sacroiliitis, not elsewhere classified: Secondary | ICD-10-CM | POA: Diagnosis not present

## 2023-04-27 NOTE — Progress Notes (Addendum)
 Toni Rich - 78 y.o. female MRN [id number]  Date of birth: [birth year]  Office Visit Note: Visit Date: 04/27/2023 PCP: Cleotilde Planas, MD Referred by: Cleotilde Planas, MD  Subjective: Chief Complaint  Patient presents with   Right Hip - Pain   HPI: Toni Rich is a pleasant 78 y.o. female who presents today for right posterior hip pain x 1 week.  [name] had relatively sudden onset of right posterior buttock and hip pain since Thursday morning.  She did have a pretty vigorous session of physical therapy on Wednesday and awoke with pain Thursday morning.  She was seen at another clinic and prescribed Naprosyn which she has been taking but has not been helpful.  She denies any pain radiating into the groin.  She occasionally feels pain in the quad.  No numbness tingling or radicular symptoms.  She has not had this issue before.  Her pain is affecting her ADLs and her sleep.  Pertinent ROS were reviewed with the patient and found to be negative unless otherwise specified above in HPI.   Assessment & Plan: Visit Diagnoses:  1. Pain in right buttock   2. Chronic right SI joint pain   3. SI joint arthritis (HCC)    Plan: Impression is right posterior hip/buttock pain that seems to be emanating from her right SI joint.  X-rays do show some SI joint arthritic change and sclerosis.  Naprosyn has not been controlling her pain and this is interfering with her ADLs.  Through shared decision making, we did proceed with ultrasound-guided right SI joint injection for both diagnostic and hopefully therapeutic purposes.  I would like to see her back in about 1.5 weeks for reevaluation.  If for some reason, she does not get relief of her pain next step would likely be considering a pelvic MRI to rule out sacral insufficiency/stress fracture.  She did report some pain in the right thigh as well but nothing in the hip and no radicular symptoms today that would suggest this coming from the lumbar spine. Will  re-evaluate at follow-up. Ok to continue Naprosyn, ice/heat for pain control.  Follow-up: Return in about 9 days (around 05/06/2023) for follow-up R-SI joint/hip in about 10 days .   Meds & Orders: No orders of the defined types were placed in this encounter.   Orders Placed This Encounter  Procedures   XR HIP UNILAT W OR W/O PELVIS 2-3 VIEWS RIGHT   US  Guided Needle Placement - No Linked Charges     Procedures: U/S-guided SI-joint injection, Right   After discussion of risk/benefits/indications, informed verbal consent was obtained. A timeout was then performed. The patient was positioned in a prone position on exam room table with a pillow placed under the pelvis for mild hip flexion. The SI joint area was cleaned and prepped with betadine and alcohol swabs. Sterile ultrasound gel was applied and the ultrasound transducer was placed in an anatomic axial plane over the PSIS, then moved distally over the SI-joint. Using ultrasound guidance, a 22-gauge, 3.5 needle was inserted from a medial to lateral approach utilizing an in-plane approach and directed into the SI-joint. The SI-joint was then injected with a mixture of 4:2 lidocaine :depomedrol with visualization of the injectate flow into the SI-joint under ultrasound visualization. The patient tolerated the procedure well without immediate complications.       Clinical History: No specialty comments available.  She reports that she has quit smoking. She has never used smokeless tobacco. No results for  input(s): HGBA1C, LABURIC in the last 8760 hours.  Objective:    Physical Exam  Gen: Well-appearing, in no acute distress; non-toxic CV: Well-perfused. Warm.  Resp: Breathing unlabored on room air; no wheezing. Psych: Fluid speech in conversation; appropriate affect; normal thought process  Ortho Exam - Right hip/SI: There is no restriction with internal or external logroll, negative FADIR.  Positive Faber, positive Gaenslen's and  SI joint compression test.  Strength of the hip was difficult to appreciate given her degree of tenderness and pain today.  Greater trochanter TTP.  No overlying redness swelling or effusion.  Imaging: XR HIP UNILAT W OR W/O PELVIS 2-3 VIEWS RIGHT Result Date: 04/27/2023 2 views of the right hip including AP and lateral femoral ordered and reviewed by myself.  There is mild age-appropriate osteoarthritic change of bilateral hips although relatively well-preserved joint space.  There is no acute fracture or collapse of the femoral head and neck junction.  Mild overhanging rim of the acetabulum.  There is right SI joint sclerosis and arthritic change that is more advanced on the right compared to left.   Past Medical/Family/Surgical/Social History: Medications & Allergies reviewed per EMR, new medications updated. Patient Active Problem List   Diagnosis Date Noted   Coronary artery disease involving native coronary artery of native heart with angina pectoris (HCC) 03/25/2021   Mixed hyperlipidemia 03/25/2021   Right thigh pain 03/25/2019   Right knee pain 10/28/2018   Primary osteoarthritis of first carpometacarpal joint of left hand 10/28/2018   Hypokalemia 12/17/2011   Bronchitis 12/17/2011   Bilateral atelectasis 12/15/2011   Colitis 12/13/2011   Leukocytosis 12/13/2011   Past Medical History:  Diagnosis Date   Anxiety    Colonic polyp    Depression    Hypercholesteremia    Hypothyroid    IBS (irritable bowel syndrome)    Migraines    Palpitations    Peripheral neuropathy    Family History  Problem Relation Age of Onset   Heart attack Sister    Past Surgical History:  Procedure Laterality Date   CESAREAN SECTION     TUBAL LIGATION     Social History   Occupational History   Not on file  Tobacco Use   Smoking status: Former   Smokeless tobacco: Never  Vaping Use   Vaping status: Never Used  Substance and Sexual Activity   Alcohol use: No   Drug use: No   Sexual  activity: Not Currently

## 2023-04-28 ENCOUNTER — Encounter: Payer: Medicare Other | Admitting: Physical Therapy

## 2023-04-29 ENCOUNTER — Telehealth: Payer: Self-pay | Admitting: Sports Medicine

## 2023-04-29 NOTE — Telephone Encounter (Signed)
 Pt called requesting Dr Vaughn Georges for send in some pain patches for her hip Please call pt about this matter at [phone].

## 2023-04-29 NOTE — Telephone Encounter (Signed)
 Called patient and advised that she can get OTC Lidocaine  4% patches.

## 2023-05-03 ENCOUNTER — Other Ambulatory Visit: Payer: Self-pay | Admitting: Cardiology

## 2023-05-05 ENCOUNTER — Encounter: Payer: Medicare Other | Admitting: Physical Therapy

## 2023-05-05 DIAGNOSIS — M9903 Segmental and somatic dysfunction of lumbar region: Secondary | ICD-10-CM | POA: Diagnosis not present

## 2023-05-05 DIAGNOSIS — M9901 Segmental and somatic dysfunction of cervical region: Secondary | ICD-10-CM | POA: Diagnosis not present

## 2023-05-05 DIAGNOSIS — M5441 Lumbago with sciatica, right side: Secondary | ICD-10-CM | POA: Diagnosis not present

## 2023-05-05 DIAGNOSIS — M9905 Segmental and somatic dysfunction of pelvic region: Secondary | ICD-10-CM | POA: Diagnosis not present

## 2023-05-06 ENCOUNTER — Ambulatory Visit: Payer: Medicare Other | Admitting: Sports Medicine

## 2023-05-07 DIAGNOSIS — M9903 Segmental and somatic dysfunction of lumbar region: Secondary | ICD-10-CM | POA: Diagnosis not present

## 2023-05-07 DIAGNOSIS — M9905 Segmental and somatic dysfunction of pelvic region: Secondary | ICD-10-CM | POA: Diagnosis not present

## 2023-05-07 DIAGNOSIS — M5441 Lumbago with sciatica, right side: Secondary | ICD-10-CM | POA: Diagnosis not present

## 2023-05-07 DIAGNOSIS — M9901 Segmental and somatic dysfunction of cervical region: Secondary | ICD-10-CM | POA: Diagnosis not present

## 2023-05-08 DIAGNOSIS — M9901 Segmental and somatic dysfunction of cervical region: Secondary | ICD-10-CM | POA: Diagnosis not present

## 2023-05-08 DIAGNOSIS — M9903 Segmental and somatic dysfunction of lumbar region: Secondary | ICD-10-CM | POA: Diagnosis not present

## 2023-05-08 DIAGNOSIS — M9905 Segmental and somatic dysfunction of pelvic region: Secondary | ICD-10-CM | POA: Diagnosis not present

## 2023-05-08 DIAGNOSIS — M5441 Lumbago with sciatica, right side: Secondary | ICD-10-CM | POA: Diagnosis not present

## 2023-05-11 DIAGNOSIS — M9901 Segmental and somatic dysfunction of cervical region: Secondary | ICD-10-CM | POA: Diagnosis not present

## 2023-05-11 DIAGNOSIS — M9905 Segmental and somatic dysfunction of pelvic region: Secondary | ICD-10-CM | POA: Diagnosis not present

## 2023-05-11 DIAGNOSIS — M5441 Lumbago with sciatica, right side: Secondary | ICD-10-CM | POA: Diagnosis not present

## 2023-05-11 DIAGNOSIS — M9903 Segmental and somatic dysfunction of lumbar region: Secondary | ICD-10-CM | POA: Diagnosis not present

## 2023-05-12 ENCOUNTER — Encounter: Payer: Medicare Other | Admitting: Physical Therapy

## 2023-05-12 DIAGNOSIS — M5441 Lumbago with sciatica, right side: Secondary | ICD-10-CM | POA: Diagnosis not present

## 2023-05-12 DIAGNOSIS — M9905 Segmental and somatic dysfunction of pelvic region: Secondary | ICD-10-CM | POA: Diagnosis not present

## 2023-05-12 DIAGNOSIS — Z131 Encounter for screening for diabetes mellitus: Secondary | ICD-10-CM | POA: Diagnosis not present

## 2023-05-12 DIAGNOSIS — M9903 Segmental and somatic dysfunction of lumbar region: Secondary | ICD-10-CM | POA: Diagnosis not present

## 2023-05-12 DIAGNOSIS — M9901 Segmental and somatic dysfunction of cervical region: Secondary | ICD-10-CM | POA: Diagnosis not present

## 2023-05-13 DIAGNOSIS — M5441 Lumbago with sciatica, right side: Secondary | ICD-10-CM | POA: Diagnosis not present

## 2023-05-13 DIAGNOSIS — M9905 Segmental and somatic dysfunction of pelvic region: Secondary | ICD-10-CM | POA: Diagnosis not present

## 2023-05-13 DIAGNOSIS — M9901 Segmental and somatic dysfunction of cervical region: Secondary | ICD-10-CM | POA: Diagnosis not present

## 2023-05-13 DIAGNOSIS — M9903 Segmental and somatic dysfunction of lumbar region: Secondary | ICD-10-CM | POA: Diagnosis not present

## 2023-05-14 DIAGNOSIS — J309 Allergic rhinitis, unspecified: Secondary | ICD-10-CM | POA: Diagnosis not present

## 2023-05-14 DIAGNOSIS — Z6824 Body mass index (BMI) 24.0-24.9, adult: Secondary | ICD-10-CM | POA: Diagnosis not present

## 2023-05-14 DIAGNOSIS — R7303 Prediabetes: Secondary | ICD-10-CM | POA: Diagnosis not present

## 2023-05-15 ENCOUNTER — Ambulatory Visit: Payer: Medicare Other | Admitting: Cardiology

## 2023-05-19 ENCOUNTER — Encounter: Payer: Medicare Other | Admitting: Physical Therapy

## 2023-05-19 DIAGNOSIS — M9903 Segmental and somatic dysfunction of lumbar region: Secondary | ICD-10-CM | POA: Diagnosis not present

## 2023-05-19 DIAGNOSIS — M9905 Segmental and somatic dysfunction of pelvic region: Secondary | ICD-10-CM | POA: Diagnosis not present

## 2023-05-19 DIAGNOSIS — M5441 Lumbago with sciatica, right side: Secondary | ICD-10-CM | POA: Diagnosis not present

## 2023-05-19 DIAGNOSIS — M9901 Segmental and somatic dysfunction of cervical region: Secondary | ICD-10-CM | POA: Diagnosis not present

## 2023-05-21 DIAGNOSIS — M9905 Segmental and somatic dysfunction of pelvic region: Secondary | ICD-10-CM | POA: Diagnosis not present

## 2023-05-21 DIAGNOSIS — M9903 Segmental and somatic dysfunction of lumbar region: Secondary | ICD-10-CM | POA: Diagnosis not present

## 2023-05-21 DIAGNOSIS — M5441 Lumbago with sciatica, right side: Secondary | ICD-10-CM | POA: Diagnosis not present

## 2023-05-21 DIAGNOSIS — M9901 Segmental and somatic dysfunction of cervical region: Secondary | ICD-10-CM | POA: Diagnosis not present

## 2023-05-22 DIAGNOSIS — M9901 Segmental and somatic dysfunction of cervical region: Secondary | ICD-10-CM | POA: Diagnosis not present

## 2023-05-22 DIAGNOSIS — M5441 Lumbago with sciatica, right side: Secondary | ICD-10-CM | POA: Diagnosis not present

## 2023-05-22 DIAGNOSIS — M9905 Segmental and somatic dysfunction of pelvic region: Secondary | ICD-10-CM | POA: Diagnosis not present

## 2023-05-22 DIAGNOSIS — M9903 Segmental and somatic dysfunction of lumbar region: Secondary | ICD-10-CM | POA: Diagnosis not present

## 2023-05-25 DIAGNOSIS — M9901 Segmental and somatic dysfunction of cervical region: Secondary | ICD-10-CM | POA: Diagnosis not present

## 2023-05-25 DIAGNOSIS — M9905 Segmental and somatic dysfunction of pelvic region: Secondary | ICD-10-CM | POA: Diagnosis not present

## 2023-05-25 DIAGNOSIS — M5441 Lumbago with sciatica, right side: Secondary | ICD-10-CM | POA: Diagnosis not present

## 2023-05-25 DIAGNOSIS — M9903 Segmental and somatic dysfunction of lumbar region: Secondary | ICD-10-CM | POA: Diagnosis not present

## 2023-05-26 ENCOUNTER — Encounter: Payer: Medicare Other | Admitting: Physical Therapy

## 2023-05-28 DIAGNOSIS — M5441 Lumbago with sciatica, right side: Secondary | ICD-10-CM | POA: Diagnosis not present

## 2023-05-28 DIAGNOSIS — M9905 Segmental and somatic dysfunction of pelvic region: Secondary | ICD-10-CM | POA: Diagnosis not present

## 2023-05-28 DIAGNOSIS — R350 Frequency of micturition: Secondary | ICD-10-CM | POA: Diagnosis not present

## 2023-05-28 DIAGNOSIS — M9903 Segmental and somatic dysfunction of lumbar region: Secondary | ICD-10-CM | POA: Diagnosis not present

## 2023-05-28 DIAGNOSIS — M9901 Segmental and somatic dysfunction of cervical region: Secondary | ICD-10-CM | POA: Diagnosis not present

## 2023-06-01 DIAGNOSIS — M9901 Segmental and somatic dysfunction of cervical region: Secondary | ICD-10-CM | POA: Diagnosis not present

## 2023-06-01 DIAGNOSIS — M9903 Segmental and somatic dysfunction of lumbar region: Secondary | ICD-10-CM | POA: Diagnosis not present

## 2023-06-01 DIAGNOSIS — M9905 Segmental and somatic dysfunction of pelvic region: Secondary | ICD-10-CM | POA: Diagnosis not present

## 2023-06-01 DIAGNOSIS — M5441 Lumbago with sciatica, right side: Secondary | ICD-10-CM | POA: Diagnosis not present

## 2023-06-02 ENCOUNTER — Encounter: Payer: Medicare Other | Admitting: Physical Therapy

## 2023-06-04 ENCOUNTER — Other Ambulatory Visit: Payer: Self-pay

## 2023-06-04 DIAGNOSIS — Z8249 Family history of ischemic heart disease and other diseases of the circulatory system: Secondary | ICD-10-CM

## 2023-06-04 DIAGNOSIS — E7800 Pure hypercholesterolemia, unspecified: Secondary | ICD-10-CM

## 2023-06-04 MED ORDER — ROSUVASTATIN CALCIUM 40 MG PO TABS: 40.0000 mg | ORAL_TABLET | Freq: Every day | ORAL | 2 refills | Status: DC

## 2023-06-16 DIAGNOSIS — R350 Frequency of micturition: Secondary | ICD-10-CM | POA: Diagnosis not present

## 2023-06-16 DIAGNOSIS — E039 Hypothyroidism, unspecified: Secondary | ICD-10-CM | POA: Diagnosis not present

## 2023-06-24 DIAGNOSIS — R319 Hematuria, unspecified: Secondary | ICD-10-CM | POA: Diagnosis not present

## 2023-07-08 DIAGNOSIS — Z1231 Encounter for screening mammogram for malignant neoplasm of breast: Secondary | ICD-10-CM | POA: Diagnosis not present

## 2023-07-27 DIAGNOSIS — Z0000 Encounter for general adult medical examination without abnormal findings: Secondary | ICD-10-CM | POA: Diagnosis not present

## 2023-07-29 DIAGNOSIS — M545 Low back pain, unspecified: Secondary | ICD-10-CM | POA: Diagnosis not present

## 2023-07-29 DIAGNOSIS — D485 Neoplasm of uncertain behavior of skin: Secondary | ICD-10-CM | POA: Diagnosis not present

## 2023-08-06 ENCOUNTER — Telehealth: Payer: Self-pay | Admitting: Sports Medicine

## 2023-08-06 NOTE — Telephone Encounter (Signed)
 Spoke with patient. She would like copies of her visit summaries with Dr. Vaughn Georges where they discussed her shoulder. She plans to stop by tomorrow (09/06/2023) around 10:00am for these.

## 2023-08-06 NOTE — Telephone Encounter (Signed)
 Patient called. She would like Toni Rich to call her. She has some questions.

## 2023-08-17 DIAGNOSIS — H53483 Generalized contraction of visual field, bilateral: Secondary | ICD-10-CM | POA: Diagnosis not present

## 2023-08-25 DIAGNOSIS — H04123 Dry eye syndrome of bilateral lacrimal glands: Secondary | ICD-10-CM | POA: Diagnosis not present

## 2023-08-25 DIAGNOSIS — H353132 Nonexudative age-related macular degeneration, bilateral, intermediate dry stage: Secondary | ICD-10-CM | POA: Diagnosis not present

## 2023-08-25 DIAGNOSIS — H57813 Brow ptosis, bilateral: Secondary | ICD-10-CM | POA: Diagnosis not present

## 2023-08-25 DIAGNOSIS — H524 Presbyopia: Secondary | ICD-10-CM | POA: Diagnosis not present

## 2023-08-25 DIAGNOSIS — H31013 Macula scars of posterior pole (postinflammatory) (post-traumatic), bilateral: Secondary | ICD-10-CM | POA: Diagnosis not present

## 2023-08-27 DIAGNOSIS — H02832 Dermatochalasis of right lower eyelid: Secondary | ICD-10-CM | POA: Diagnosis not present

## 2023-08-27 DIAGNOSIS — H02423 Myogenic ptosis of bilateral eyelids: Secondary | ICD-10-CM | POA: Diagnosis not present

## 2023-08-27 DIAGNOSIS — H02834 Dermatochalasis of left upper eyelid: Secondary | ICD-10-CM | POA: Diagnosis not present

## 2023-08-27 DIAGNOSIS — H0279 Other degenerative disorders of eyelid and periocular area: Secondary | ICD-10-CM | POA: Diagnosis not present

## 2023-08-27 DIAGNOSIS — H53483 Generalized contraction of visual field, bilateral: Secondary | ICD-10-CM | POA: Diagnosis not present

## 2023-08-27 DIAGNOSIS — H02835 Dermatochalasis of left lower eyelid: Secondary | ICD-10-CM | POA: Diagnosis not present

## 2023-08-27 DIAGNOSIS — H57813 Brow ptosis, bilateral: Secondary | ICD-10-CM | POA: Diagnosis not present

## 2023-08-27 DIAGNOSIS — H02831 Dermatochalasis of right upper eyelid: Secondary | ICD-10-CM | POA: Diagnosis not present

## 2023-08-31 DIAGNOSIS — M25561 Pain in right knee: Secondary | ICD-10-CM | POA: Diagnosis not present

## 2023-09-01 ENCOUNTER — Telehealth (HOSPITAL_BASED_OUTPATIENT_CLINIC_OR_DEPARTMENT_OTHER): Payer: Self-pay | Admitting: *Deleted

## 2023-09-01 NOTE — Telephone Encounter (Addendum)
 Spoke with pt regarding preop clearance. Pt stated she has to reschedule her procedure. Pt was asked to call us  back once she has a date for her procedure. Pt agreed.

## 2023-09-01 NOTE — Telephone Encounter (Signed)
   Name: Toni Rich  DOB: 12-18-1945  MRN: [id number]  Primary Cardiologist: Dorothye Gathers, MD   Preoperative team, please contact this patient and set up a phone call appointment for further preoperative risk assessment. Please obtain consent and complete medication review. Thank you for your help.  I confirm that guidance regarding antiplatelet and oral anticoagulation therapy has been completed and, if necessary, noted below.  Ideally aspirin  should be continued without interruption, however if the bleeding risk is too great, aspirin  may be held for 5-7 days prior to surgery. Please resume aspirin  post operatively when it is felt to be safe from a bleeding standpoint.    I also confirmed the patient resides in the state of [place] . As per [facility] Medical Board telemedicine laws, the patient must reside in the state in which the provider is licensed.   Ava Boatman, NP 09/01/2023, 12:24 PM [facility]

## 2023-09-01 NOTE — Telephone Encounter (Signed)
   Pre-operative Risk Assessment    Patient Name: Toni Rich  DOB: 1945/05/05 MRN: [id number]   Date of last office visit: 04/16/2022 Date of next office visit: None  Request for Surgical Clearance    Procedure:  Bilateral upper eyelid ptosis repair through skin removal approach CC bilateral upper eyelid blepharoplasty  Date of Surgery:  Clearance 09/23/23                                 Surgeon:  Dr. Willie Harry Surgeon's Group or Practice Name:  [facility] Phone number:  [phone] Fax number:  [phone]   Type of Clearance Requested:   - Medical  - Pharmacy:  Hold Aspirin  Not indicated   Type of Anesthesia:  MAC   Additional requests/questions:    Signed, Lauris Port   09/01/2023, 7:29 AM

## 2023-09-02 ENCOUNTER — Telehealth: Payer: Self-pay

## 2023-09-02 NOTE — Telephone Encounter (Signed)
  Patient Consent for Virtual Visit        Toni Rich has provided verbal consent on 09/02/2023 for a virtual visit (video or telephone).   CONSENT FOR VIRTUAL VISIT FOR:  Toni Rich  By participating in this virtual visit I agree to the following:  I hereby voluntarily request, consent and authorize [facility] and its employed or contracted physicians, physician assistants, nurse practitioners or other licensed health care professionals (the Practitioner), to provide me with telemedicine health care services (the "Services") as deemed necessary by the treating Practitioner. I acknowledge and consent to receive the Services by the Practitioner via telemedicine. I understand that the telemedicine visit will involve communicating with the Practitioner through live audiovisual communication technology and the disclosure of certain medical information by electronic transmission. I acknowledge that I have been given the opportunity to request an in-person assessment or other available alternative prior to the telemedicine visit and am voluntarily participating in the telemedicine visit.  I understand that I have the right to withhold or withdraw my consent to the use of telemedicine in the course of my care at any time, without affecting my right to future care or treatment, and that the Practitioner or I may terminate the telemedicine visit at any time. I understand that I have the right to inspect all information obtained and/or recorded in the course of the telemedicine visit and may receive copies of available information for a reasonable fee.  I understand that some of the potential risks of receiving the Services via telemedicine include:  Delay or interruption in medical evaluation due to technological equipment failure or disruption; Information transmitted may not be sufficient (e.g. poor resolution of images) to allow for appropriate medical decision making by the  Practitioner; and/or  In rare instances, security protocols could fail, causing a breach of personal health information.  Furthermore, I acknowledge that it is my responsibility to provide information about my medical history, conditions and care that is complete and accurate to the best of my ability. I acknowledge that Practitioner's advice, recommendations, and/or decision may be based on factors not within their control, such as incomplete or inaccurate data provided by me or distortions of diagnostic images or specimens that may result from electronic transmissions. I understand that the practice of medicine is not an exact science and that Practitioner makes no warranties or guarantees regarding treatment outcomes. I acknowledge that a copy of this consent can be made available to me via my patient portal [facility] MyChart), or I can request a printed copy by calling the office of [facility].    I understand that my insurance will be billed for this visit.   I have read or had this consent read to me. I understand the contents of this consent, which adequately explains the benefits and risks of the Services being provided via telemedicine.  I have been provided ample opportunity to ask questions regarding this consent and the Services and have had my questions answered to my satisfaction. I give my informed consent for the services to be provided through the use of telemedicine in my medical care

## 2023-09-02 NOTE — Telephone Encounter (Signed)
 Pt and surgeon's office confirmed pt's procedure is on 10/07/23. Pt was scheduled for VV on 09/24/23.

## 2023-09-15 DIAGNOSIS — K219 Gastro-esophageal reflux disease without esophagitis: Secondary | ICD-10-CM | POA: Diagnosis not present

## 2023-09-15 DIAGNOSIS — E039 Hypothyroidism, unspecified: Secondary | ICD-10-CM | POA: Diagnosis not present

## 2023-09-15 DIAGNOSIS — R0789 Other chest pain: Secondary | ICD-10-CM | POA: Diagnosis not present

## 2023-09-15 DIAGNOSIS — I2510 Atherosclerotic heart disease of native coronary artery without angina pectoris: Secondary | ICD-10-CM | POA: Diagnosis not present

## 2023-09-24 ENCOUNTER — Ambulatory Visit: Attending: Cardiology | Admitting: Nurse Practitioner

## 2023-09-24 ENCOUNTER — Encounter: Payer: Self-pay | Admitting: Nurse Practitioner

## 2023-09-24 DIAGNOSIS — Z01810 Encounter for preprocedural cardiovascular examination: Secondary | ICD-10-CM

## 2023-09-24 NOTE — Progress Notes (Signed)
 Virtual Visit via Telephone Note   Because of Toni Rich co-morbid illnesses, she is at least at moderate risk for complications without adequate follow up.  This format is felt to be most appropriate for this patient at this time.  Due to technical limitations with video connection (technology), today's appointment will be conducted as an audio only telehealth visit, and Toni Rich verbally agreed to proceed in this manner.   All issues noted in this document were discussed and addressed.  No physical exam could be performed with this format.  Evaluation Performed:  Preoperative cardiovascular risk assessment _____________   Date:  09/24/2023   Patient ID:  Toni Rich, DOB [birth year], MRN [id number] Patient Location:  Home Provider location:   Office  Primary Care Provider:  Perley Bradley, MD Primary Cardiologist:  Dorothye Gathers, MD  Chief Complaint / Patient Profile   78 y.o. y/o female with a h/o elevated coronary calcium  score, GERD, hyperlipidemia, palpitations, hypothyroidism, depression, anxiety who is pending bilateral upper eyelid ptosis repair through skin removal/bilateral upper eyelid blepharoplasty with Dr. Joseph Nickel on 10/07/23 and presents today for telephonic preoperative cardiovascular risk assessment.  History of Present Illness    Toni Rich is a 78 y.o. female who presents via audio/video conferencing for a telehealth visit today.  Pt was last seen in cardiology clinic on 03/04/23 by Lawana Pray, NP.  At that time Toni Rich was doing well.  The patient is now pending procedure as outlined above. Since her last visit, she denies chest pain, shortness of breath, lower extremity edema, fatigue, palpitations, melena, hematuria, hemoptysis, diaphoresis, weakness, presyncope, syncope, orthopnea, and PND. She remains active around her home and is able to complete > 4 METS activity without concerning cardiac symptoms.    Past Medical History    Past  Medical History:  Diagnosis Date   Anxiety    Colonic polyp    Depression    Hypercholesteremia    Hypothyroid    IBS (irritable bowel syndrome)    Migraines    Palpitations    Peripheral neuropathy    Past Surgical History:  Procedure Laterality Date   CESAREAN SECTION     TUBAL LIGATION      Allergies  Allergies  Allergen Reactions   Sulfa Antibiotics Other (See Comments)    unknown    Home Medications    Prior to Admission medications   Medication Sig Start Date End Date Taking? Authorizing Provider  alendronate (FOSAMAX) 70 MG tablet Take 70 mg by mouth once a week. 04/18/22   [provider]  aspirin  EC 81 MG tablet Take 1 tablet (81 mg total) by mouth daily. 08/26/19   Hugh Madura, MD  atenolol  (TENORMIN ) 50 MG tablet Take 50 mg by mouth daily.    [provider]  busPIRone (BUSPAR) 15 MG tablet 1 tablet Orally Twice a day 04/22/22   [provider]  Calcium  Carbonate-Vitamin D (CALCIUM  600 + D PO) Take 1 tablet by mouth daily.    [provider]  cholecalciferol (VITAMIN D) 1000 UNITS tablet Take 2,000 Units by mouth daily.    [provider]  citalopram (CELEXA) 20 MG tablet Take 40 mg by mouth daily.    [provider]  fluticasone (FLONASE) 50 MCG/ACT nasal spray Place 2 sprays into both nostrils daily.    [provider]  isosorbide  mononitrate (IMDUR ) 30 MG 24 hr tablet TAKE 1 TABLET BY MOUTH DAILY 05/05/23   Dorothye Gathers  C, MD  lansoprazole (PREVACID) 30 MG capsule Take 30 mg by mouth daily at 12 noon. Patient not taking: Reported on 03/04/2023    [provider]  levothyroxine  (SYNTHROID , LEVOTHROID) 75 MCG tablet Take 75 mcg by mouth daily.    [provider]  LORazepam (ATIVAN) 1 MG tablet Take 1 mg by mouth every 8 (eight) hours.    [provider]  Multiple Vitamins-Minerals (MULTIVITAMIN PO) Take 1 tablet by mouth daily.    [provider]  omeprazole  (PRILOSEC) 40 MG capsule Take 40 mg by mouth daily.    [provider]  predniSONE  (DELTASONE ) 10 MG tablet Take 2 tablets (20mg  total) once daily for 5 days, then take 1 tablet (10mg ) once daily for 5 additional days Patient not taking: Reported on 03/04/2023 02/09/23   Shauna Del, DO  rosuvastatin  (CRESTOR ) 40 MG tablet Take 1 tablet (40 mg total) by mouth daily. 06/04/23   Hugh Madura, MD  sucralfate (CARAFATE) 1 g tablet Take 1 g by mouth 2 (two) times daily.    [provider]  traMADol  (ULTRAM ) 50 MG tablet Take 1 tablet (50 mg total) by mouth every 12 (twelve) hours as needed. 01/23/23   Brooks, Dana, DO  traZODone (DESYREL) 50 MG tablet Take 25 mg by mouth at bedtime as needed for sleep.     [provider]    Physical Exam    Vital Signs:  Toni Rich does not have vital signs available for review today.  Given telephonic nature of communication, physical exam is limited. AAOx3. NAD. Normal affect.  Speech and respirations are unlabored.  Accessory Clinical Findings    None  Assessment & Plan    1.  Preoperative Cardiovascular Risk Assessment: According to the Revised Cardiac Risk Index (RCRI), her Perioperative Risk of Major Cardiac Event is (%): 0.4. Her Functional Capacity in METs is: 7.04 according to the Duke Activity Status Index (DASI). The patient is doing well from a cardiac perspective. Therefore, based on ACC/AHA guidelines, the patient would be at acceptable risk for the planned procedure without further cardiovascular testing.   The patient was advised that if she develops new symptoms prior to surgery to contact our office to arrange for a follow-up visit, and she verbalized understanding.  Per office protocol, she may hold aspirin  for 5-7 days prior to procedure and should resume as soon as hemodynamically stable postoperatively.  A copy of this note will be routed to requesting surgeon.  Time:   Today, I have spent 10 minutes  with the patient with telehealth technology discussing medical history, symptoms, and management plan.    Gerldine Koch, NP-C  09/24/2023, 9:26 AM [address], [address] [place], [place] [address] Office [phone] Fax [phone]

## 2023-10-05 DIAGNOSIS — M8588 Other specified disorders of bone density and structure, other site: Secondary | ICD-10-CM | POA: Diagnosis not present

## 2023-10-07 DIAGNOSIS — H02835 Dermatochalasis of left lower eyelid: Secondary | ICD-10-CM | POA: Diagnosis not present

## 2023-10-07 DIAGNOSIS — H02834 Dermatochalasis of left upper eyelid: Secondary | ICD-10-CM | POA: Diagnosis not present

## 2023-10-07 DIAGNOSIS — H02423 Myogenic ptosis of bilateral eyelids: Secondary | ICD-10-CM | POA: Diagnosis not present

## 2023-10-07 DIAGNOSIS — H57813 Brow ptosis, bilateral: Secondary | ICD-10-CM | POA: Diagnosis not present

## 2023-10-07 DIAGNOSIS — H02832 Dermatochalasis of right lower eyelid: Secondary | ICD-10-CM | POA: Diagnosis not present

## 2023-10-07 DIAGNOSIS — H02831 Dermatochalasis of right upper eyelid: Secondary | ICD-10-CM | POA: Diagnosis not present

## 2023-10-07 DIAGNOSIS — H0279 Other degenerative disorders of eyelid and periocular area: Secondary | ICD-10-CM | POA: Diagnosis not present

## 2023-10-07 DIAGNOSIS — H53483 Generalized contraction of visual field, bilateral: Secondary | ICD-10-CM | POA: Diagnosis not present

## 2023-10-12 DIAGNOSIS — E7800 Pure hypercholesterolemia, unspecified: Secondary | ICD-10-CM | POA: Diagnosis not present

## 2023-10-12 DIAGNOSIS — I25119 Atherosclerotic heart disease of native coronary artery with unspecified angina pectoris: Secondary | ICD-10-CM | POA: Diagnosis not present

## 2023-10-12 DIAGNOSIS — E039 Hypothyroidism, unspecified: Secondary | ICD-10-CM | POA: Diagnosis not present

## 2023-10-19 DIAGNOSIS — Z79899 Other long term (current) drug therapy: Secondary | ICD-10-CM | POA: Diagnosis not present

## 2023-10-19 DIAGNOSIS — I25119 Atherosclerotic heart disease of native coronary artery with unspecified angina pectoris: Secondary | ICD-10-CM | POA: Diagnosis not present

## 2023-10-19 DIAGNOSIS — R413 Other amnesia: Secondary | ICD-10-CM | POA: Diagnosis not present

## 2023-10-21 ENCOUNTER — Other Ambulatory Visit: Payer: Self-pay | Admitting: Family Medicine

## 2023-10-21 DIAGNOSIS — R413 Other amnesia: Secondary | ICD-10-CM

## 2023-10-22 ENCOUNTER — Telehealth: Payer: Self-pay | Admitting: Cardiology

## 2023-10-22 NOTE — Telephone Encounter (Signed)
 Contacted by Olam Pinal, MD her PCP.  Having ongoing chest pain. History can be somewhat challenging with some memory impairment.  Has known moderate CAD from prior coronary CT  Please see if she can come in to see me on Monday July 21 add on to morning to discuss cardiac cath.   Thanks  Oneil Parchment, MD

## 2023-10-22 NOTE — Telephone Encounter (Signed)
 Pt has been scheduled for 11/02/23 at 9:40 am as requested.  She will call back prior to then if any questions or concerns.

## 2023-10-30 ENCOUNTER — Encounter: Payer: Self-pay | Admitting: Family Medicine

## 2023-11-02 ENCOUNTER — Ambulatory Visit: Attending: Cardiology | Admitting: Cardiology

## 2023-11-02 ENCOUNTER — Encounter: Payer: Self-pay | Admitting: Cardiology

## 2023-11-02 ENCOUNTER — Telehealth: Payer: Self-pay | Admitting: Cardiology

## 2023-11-02 VITALS — BP 116/63 | HR 70 | Ht 61.0 in | Wt 134.8 lb

## 2023-11-02 DIAGNOSIS — I25119 Atherosclerotic heart disease of native coronary artery with unspecified angina pectoris: Secondary | ICD-10-CM | POA: Diagnosis not present

## 2023-11-02 DIAGNOSIS — R002 Palpitations: Secondary | ICD-10-CM | POA: Diagnosis not present

## 2023-11-02 DIAGNOSIS — Z01812 Encounter for preprocedural laboratory examination: Secondary | ICD-10-CM | POA: Diagnosis not present

## 2023-11-02 LAB — CBC
Hematocrit: 38.8 % (ref 34.0–46.6)
Hemoglobin: 12.8 g/dL (ref 11.1–15.9)
MCH: 33.2 pg — ABNORMAL HIGH (ref 26.6–33.0)
MCHC: 33 g/dL (ref 31.5–35.7)
MCV: 101 fL — ABNORMAL HIGH (ref 79–97)
Platelets: 172 x10E3/uL (ref 150–450)
RBC: 3.85 x10E6/uL (ref 3.77–5.28)
RDW: 12.8 % (ref 11.7–15.4)
WBC: 6.8 x10E3/uL (ref 3.4–10.8)

## 2023-11-02 NOTE — Patient Instructions (Signed)
 Medication Instructions:  Your physician recommends that you continue on your current medications as directed. Please refer to the Current Medication list given to you today.  *If you need a refill on your cardiac medications before your next appointment, please call your pharmacy*  Lab Work: CBC and BMET today If you have labs (blood work) drawn today and your tests are completely normal, you will receive your results only by: MyChart Message (if you have MyChart) OR A paper copy in the mail If you have any lab test that is abnormal or we need to change your treatment, we will call you to review the results.  Testing/Procedures: Your physician has requested that you have a cardiac catheterization. Cardiac catheterization is used to diagnose and/or treat various heart conditions. Doctors may recommend this procedure for a number of different reasons. The most common reason is to evaluate chest pain. Chest pain can be a symptom of coronary artery disease (CAD), and cardiac catheterization can show whether plaque is narrowing or blocking your heart's arteries. This procedure is also used to evaluate the valves, as well as measure the blood flow and oxygen levels in different parts of your heart. For further information please visit [email or web address] Please follow instruction sheet, as given.   Follow-Up: At [facility], you and your health needs are our priority.  As part of our continuing mission to provide you with exceptional heart care, our providers are all part of one team.  This team includes your primary Cardiologist (physician) and Advanced Practice Providers or APPs (Physician Assistants and Nurse Practitioners) who all work together to provide you with the care you need, when you need it.  Your next appointment:   6 months with APP  We recommend signing up for the patient portal called MyChart.  Sign up information is provided on this After Visit Summary.  MyChart is used  to connect with patients for Virtual Visits (Telemedicine).  Patients are able to view lab/test results, encounter notes, upcoming appointments, etc.  Non-urgent messages can be sent to your provider as well.   To learn more about what you can do with MyChart, go to [email or web address].   Other Instructions       Cardiac/Peripheral Catheterization   You are scheduled for a Cardiac Catheterization on Friday, July 25 with Dr. Lurena Red.  1. Please arrive at the [facility] (Main Entrance A) at [facility]: [address] [place], [place] [address] at 9:00 AM (This time is 2 hour(s) before your procedure to ensure your preparation).   Free valet parking service is available. You will check in at ADMITTING. The support person will be asked to wait in the waiting room.  It is OK to have someone drop you off and come back when you are ready to be discharged.        Special note: Every effort is made to have your procedure done on time. Please understand that emergencies sometimes delay scheduled procedures.  2. Diet: Do not eat solid foods after midnight.  You may have clear liquids until 5 AM the day of the procedure.  3. Labs: You will need to have blood drawn today.  You do not need to be fasting.  4. Medication instructions in preparation for your procedure:   Contrast Allergy: No   On the morning of your procedure, take Aspirin  81 mg and any morning medicines NOT listed above.  You may use sips of water.  5. Plan to go home the  same day, you will only stay overnight if medically necessary. 6. You MUST have a responsible adult to drive you home. 7. An adult MUST be with you the first 24 hours after you arrive home. 8. Bring a current list of your medications, and the last time and date medication taken. 9. Bring ID and current insurance cards. 10.Please wear clothes that are easy to get on and off and wear slip-on shoes.  Thank you for allowing us  to care for you!    -- [facility]

## 2023-11-02 NOTE — Progress Notes (Signed)
 Cardiology Office Note:  .   Date:  11/02/2023  ID:  Toni Rich, DOB December 10, 1945, MRN [id number] PCP: Cleotilde Planas, MD  [facility] Providers Cardiologist:  Oneil Parchment, MD    History of Present Illness: .   Toni Rich is a 78 y.o. female Discussed the use of AI scribe software for clinical note transcription with the patient, who gave verbal consent to proceed.  History of Present Illness She experiences intermittent chest pain located centrally, lasting a few minutes each time. There are no specific exacerbating factors, as she describes the pain as having 'no rhyme or reason'.  Her past medical history includes a coronary CT scan from 2021, which showed a calcium  score of 239 (76th percentile).  She is currently taking aspirin  81 mg, atenolol  50 mg daily, isosorbide  30 mg daily, and Crestor  40 mg daily.     Studies Reviewed: SABRA   EKG Interpretation Date/Time:  Monday November 02 2023 09:58:46 EDT Ventricular Rate:  70 PR Interval:  156 QRS Duration:  52 QT Interval:  404 QTC Calculation: 436 R Axis:   86  Text Interpretation: Sinus rhythm with occasional Premature ventricular complexes Low voltage QRS Septal infarct (cited on or before 04-Mar-2023) When compared with ECG of 04-Mar-2023 13:33, Premature ventricular complexes are now Present Confirmed by Parchment Oneil ([id number]) on 11/02/2023 10:03:52 AM    Results LABS HbA1c: 5.7 Hb: 14.6 Cr: 0.9  RADIOLOGY Coronary CT scan: Calcium  score 239, 76th percentile, three vessel mixed calcified plaque, mid LAD moderate lesion 69% narrowed (2021)  DIAGNOSTIC EKG: Normal (11/02/2023) Risk Assessment/Calculations:            Physical Exam:   VS:  BP 116/63   Pulse 70   Ht 5' 1 (1.549 m)   Wt 134 lb 12.8 oz (61.1 kg)   SpO2 99%   BMI 25.47 kg/m    Wt Readings from Last 3 Encounters:  11/02/23 134 lb 12.8 oz (61.1 kg)  03/04/23 139 lb (63 kg)  06/16/22 136 lb (61.7 kg)    GEN: Well nourished, well developed  in no acute distress NECK: No JVD; No carotid bruits CARDIAC: RRR, no murmurs, no rubs, no gallops RESPIRATORY:  Clear to auscultation without rales, wheezing or rhonchi  ABDOMEN: Soft, non-tender, non-distended EXTREMITIES:  No edema; No deformity   ASSESSMENT AND PLAN: .    Assessment and Plan Assessment & Plan Coronary artery disease with angina Intermittent central chest pain with a history of coronary artery disease. Previous coronary CT scan in 2021 showed a calcium  score of 239 (76th percentile) and three vessel mixed calcified plaque with a mid LAD moderate lesion (69% narrowed). Current EKG is normal. Decision to proceed with left heart catheterization to assess for any changes since the last CT scan. Risks of the procedure, including stroke, myocardial infarction, death, renal impairment, and bleeding, have been discussed. She is willing to proceed with the catheterization. - Proceed with left heart catheterization with possible PCI - Continue aspirin  81 mg daily - Continue atenolol  50 mg daily - Continue isosorbide  30 mg daily - Continue Crestor  40 mg daily       Informed Consent   Shared Decision Making/Informed Consent The risks [stroke (1 in 1000), death (1 in 1000), kidney failure [usually temporary] (1 in 500), bleeding (1 in 200), allergic reaction [possibly serious] (1 in 200)], benefits (diagnostic support and management of coronary artery disease) and alternatives of a cardiac catheterization were discussed in detail with [name]  and she is willing to proceed.     Dispo: 6 months APP  Signed, Oneil Parchment, MD

## 2023-11-02 NOTE — Telephone Encounter (Signed)
 Patient is wanting to confirm her surgery for Friday 7/25 at 9am.  She wants to know how long it's going to take, so she can let the person know who is bringing her.

## 2023-11-02 NOTE — Telephone Encounter (Signed)
 Called pt advised Heart Cath scheduled for 11/06/23 arrival time of 9 am.  Advised total time depends on what is found and recovery.  Typically takes 5-6 hours but can change.  Pt reports someone will drop her off and come back to pick her up.  Advised to let them know time can vary.  Pt expresses understanding.

## 2023-11-02 NOTE — H&P (View-Only) (Signed)
 Cardiology Office Note:  .   Date:  11/02/2023  ID:  Toni Rich, DOB December 10, 1945, MRN [id number] PCP: Cleotilde Planas, MD  [facility] Providers Cardiologist:  Oneil Parchment, MD    History of Present Illness: .   Toni Rich is a 78 y.o. female Discussed the use of AI scribe software for clinical note transcription with the patient, who gave verbal consent to proceed.  History of Present Illness She experiences intermittent chest pain located centrally, lasting a few minutes each time. There are no specific exacerbating factors, as she describes the pain as having 'no rhyme or reason'.  Her past medical history includes a coronary CT scan from 2021, which showed a calcium  score of 239 (76th percentile).  She is currently taking aspirin  81 mg, atenolol  50 mg daily, isosorbide  30 mg daily, and Crestor  40 mg daily.     Studies Reviewed: SABRA   EKG Interpretation Date/Time:  Monday November 02 2023 09:58:46 EDT Ventricular Rate:  70 PR Interval:  156 QRS Duration:  52 QT Interval:  404 QTC Calculation: 436 R Axis:   86  Text Interpretation: Sinus rhythm with occasional Premature ventricular complexes Low voltage QRS Septal infarct (cited on or before 04-Mar-2023) When compared with ECG of 04-Mar-2023 13:33, Premature ventricular complexes are now Present Confirmed by Parchment Oneil ([id number]) on 11/02/2023 10:03:52 AM    Results LABS HbA1c: 5.7 Hb: 14.6 Cr: 0.9  RADIOLOGY Coronary CT scan: Calcium  score 239, 76th percentile, three vessel mixed calcified plaque, mid LAD moderate lesion 69% narrowed (2021)  DIAGNOSTIC EKG: Normal (11/02/2023) Risk Assessment/Calculations:            Physical Exam:   VS:  BP 116/63   Pulse 70   Ht 5' 1 (1.549 m)   Wt 134 lb 12.8 oz (61.1 kg)   SpO2 99%   BMI 25.47 kg/m    Wt Readings from Last 3 Encounters:  11/02/23 134 lb 12.8 oz (61.1 kg)  03/04/23 139 lb (63 kg)  06/16/22 136 lb (61.7 kg)    GEN: Well nourished, well developed  in no acute distress NECK: No JVD; No carotid bruits CARDIAC: RRR, no murmurs, no rubs, no gallops RESPIRATORY:  Clear to auscultation without rales, wheezing or rhonchi  ABDOMEN: Soft, non-tender, non-distended EXTREMITIES:  No edema; No deformity   ASSESSMENT AND PLAN: .    Assessment and Plan Assessment & Plan Coronary artery disease with angina Intermittent central chest pain with a history of coronary artery disease. Previous coronary CT scan in 2021 showed a calcium  score of 239 (76th percentile) and three vessel mixed calcified plaque with a mid LAD moderate lesion (69% narrowed). Current EKG is normal. Decision to proceed with left heart catheterization to assess for any changes since the last CT scan. Risks of the procedure, including stroke, myocardial infarction, death, renal impairment, and bleeding, have been discussed. She is willing to proceed with the catheterization. - Proceed with left heart catheterization with possible PCI - Continue aspirin  81 mg daily - Continue atenolol  50 mg daily - Continue isosorbide  30 mg daily - Continue Crestor  40 mg daily       Informed Consent   Shared Decision Making/Informed Consent The risks [stroke (1 in 1000), death (1 in 1000), kidney failure [usually temporary] (1 in 500), bleeding (1 in 200), allergic reaction [possibly serious] (1 in 200)], benefits (diagnostic support and management of coronary artery disease) and alternatives of a cardiac catheterization were discussed in detail with [name]  and she is willing to proceed.     Dispo: 6 months APP  Signed, Oneil Parchment, MD

## 2023-11-03 LAB — BASIC METABOLIC PANEL WITH GFR
BUN/Creatinine Ratio: 8 — ABNORMAL LOW (ref 12–28)
BUN: 6 mg/dL — ABNORMAL LOW (ref 8–27)
CO2: 23 mmol/L (ref 20–29)
Calcium: 8.6 mg/dL — ABNORMAL LOW (ref 8.7–10.3)
Chloride: 108 mmol/L — ABNORMAL HIGH (ref 96–106)
Creatinine, Ser: 0.8 mg/dL (ref 0.57–1.00)
Glucose: 88 mg/dL (ref 70–99)
Potassium: 3.8 mmol/L (ref 3.5–5.2)
Sodium: 143 mmol/L (ref 134–144)
eGFR: 75 mL/min/1.73 (ref >59)

## 2023-11-04 ENCOUNTER — Telehealth: Payer: Self-pay | Admitting: *Deleted

## 2023-11-04 NOTE — Telephone Encounter (Signed)
 Cardiac Catheterization scheduled at [facility] for: Friday November 06, 2023 11 AM Arrival time [facility] Main Entrance A at: 9 AM  Nothing to eat after midnight prior to procedure, clear liquids until 5 AM day of procedure.  Medication instructions: -Usual morning medications can be taken with sips of water including aspirin  81 mg.  Plan to go home the same day, you will only stay overnight if medically necessary.  You must have responsible adult to drive you home.  Someone must be with you the first 24 hours after you arrive home.  Reviewed procedure instructions with patient.

## 2023-11-05 ENCOUNTER — Ambulatory Visit: Payer: Self-pay | Admitting: Cardiology

## 2023-11-06 ENCOUNTER — Ambulatory Visit (HOSPITAL_COMMUNITY)
Admission: RE | Admit: 2023-11-06 | Discharge: 2023-11-06 | Disposition: A | Discharge status: Home / Self Care | Attending: Internal Medicine | Admitting: Internal Medicine

## 2023-11-06 ENCOUNTER — Other Ambulatory Visit: Payer: Self-pay

## 2023-11-06 ENCOUNTER — Encounter (HOSPITAL_COMMUNITY)
Admission: RE | Disposition: A | Discharge status: Home / Self Care | Payer: Self-pay | Source: Home / Self Care | Attending: Internal Medicine

## 2023-11-06 DIAGNOSIS — R079 Chest pain, unspecified: Secondary | ICD-10-CM

## 2023-11-06 DIAGNOSIS — R9430 Abnormal result of cardiovascular function study, unspecified: Secondary | ICD-10-CM | POA: Diagnosis not present

## 2023-11-06 DIAGNOSIS — E785 Hyperlipidemia, unspecified: Secondary | ICD-10-CM | POA: Diagnosis not present

## 2023-11-06 DIAGNOSIS — Z7982 Long term (current) use of aspirin: Secondary | ICD-10-CM | POA: Diagnosis not present

## 2023-11-06 DIAGNOSIS — I10 Essential (primary) hypertension: Secondary | ICD-10-CM | POA: Insufficient documentation

## 2023-11-06 DIAGNOSIS — I25119 Atherosclerotic heart disease of native coronary artery with unspecified angina pectoris: Secondary | ICD-10-CM | POA: Insufficient documentation

## 2023-11-06 DIAGNOSIS — Z79899 Other long term (current) drug therapy: Secondary | ICD-10-CM | POA: Insufficient documentation

## 2023-11-06 HISTORY — PX: LEFT HEART CATH AND CORONARY ANGIOGRAPHY: CATH118249

## 2023-11-06 SURGERY — LEFT HEART CATH AND CORONARY ANGIOGRAPHY
Anesthesia: LOCAL

## 2023-11-06 MED ORDER — VERAPAMIL HCL 2.5 MG/ML IV SOLN
INTRAVENOUS | Status: AC
  Filled 2023-11-06: qty 2

## 2023-11-06 MED ORDER — SODIUM CHLORIDE 0.9% FLUSH: 3.0000 mL | Freq: Two times a day (BID) | INTRAVENOUS | Status: DC

## 2023-11-06 MED ORDER — ACETAMINOPHEN 325 MG PO TABS: 650.0000 mg | ORAL_TABLET | ORAL | Status: DC | PRN

## 2023-11-06 MED ORDER — ASPIRIN 81 MG PO CHEW: 81.0000 mg | CHEWABLE_TABLET | ORAL | Status: DC

## 2023-11-06 MED ORDER — ONDANSETRON HCL 4 MG/2ML IJ SOLN: 4.0000 mg | Freq: Four times a day (QID) | INTRAMUSCULAR | Status: DC | PRN

## 2023-11-06 MED ORDER — HEPARIN SODIUM (PORCINE) 1000 UNIT/ML IJ SOLN
INTRAMUSCULAR | Status: DC | PRN
  Administered 2023-11-06: 5000 [IU] via INTRAVENOUS

## 2023-11-06 MED ORDER — SODIUM CHLORIDE 0.9 % WEIGHT BASED INFUSION: 1.0000 mL/kg/h | INTRAVENOUS | Status: DC

## 2023-11-06 MED ORDER — FENTANYL CITRATE (PF) 100 MCG/2ML IJ SOLN
INTRAMUSCULAR | Status: DC | PRN
  Administered 2023-11-06 (×2): 25 ug via INTRAVENOUS

## 2023-11-06 MED ORDER — MIDAZOLAM HCL 2 MG/2ML IJ SOLN
INTRAMUSCULAR | Status: AC
  Filled 2023-11-06: qty 2

## 2023-11-06 MED ORDER — MIDAZOLAM HCL 2 MG/2ML IJ SOLN
INTRAMUSCULAR | Status: DC | PRN
  Administered 2023-11-06 (×2): 1 mg via INTRAVENOUS

## 2023-11-06 MED ORDER — LABETALOL HCL 5 MG/ML IV SOLN: 10.0000 mg | INTRAVENOUS | Status: DC | PRN

## 2023-11-06 MED ORDER — SODIUM CHLORIDE 0.9% FLUSH: 3.0000 mL | INTRAVENOUS | Status: DC | PRN

## 2023-11-06 MED ORDER — SODIUM CHLORIDE 0.9 % IV SOLN: 250.0000 mL | INTRAVENOUS | Status: DC | PRN

## 2023-11-06 MED ORDER — HEPARIN SODIUM (PORCINE) 1000 UNIT/ML IJ SOLN
INTRAMUSCULAR | Status: AC
  Filled 2023-11-06: qty 10

## 2023-11-06 MED ORDER — HEPARIN (PORCINE) IN NACL 1000-0.9 UT/500ML-% IV SOLN
INTRAVENOUS | Status: DC | PRN
  Administered 2023-11-06 (×2): 500 mL

## 2023-11-06 MED ORDER — LIDOCAINE HCL (PF) 1 % IJ SOLN
INTRAMUSCULAR | Status: AC
Start: 2023-11-06 — End: 2023-11-06
  Filled 2023-11-06: qty 30

## 2023-11-06 MED ORDER — HYDRALAZINE HCL 20 MG/ML IJ SOLN: 10.0000 mg | INTRAMUSCULAR | Status: DC | PRN

## 2023-11-06 MED ORDER — LIDOCAINE HCL (PF) 1 % IJ SOLN
INTRAMUSCULAR | Status: DC | PRN
Start: 2023-11-06 — End: 2023-11-06
  Administered 2023-11-06: 2 mL via INTRADERMAL

## 2023-11-06 MED ORDER — IOHEXOL 350 MG/ML SOLN
INTRAVENOUS | Status: DC | PRN
  Administered 2023-11-06: 25 mL

## 2023-11-06 MED ORDER — FENTANYL CITRATE (PF) 100 MCG/2ML IJ SOLN
INTRAMUSCULAR | Status: AC
  Filled 2023-11-06: qty 2

## 2023-11-06 MED ORDER — SODIUM CHLORIDE 0.9 % WEIGHT BASED INFUSION: 3.0000 mL/kg/h | INTRAVENOUS | Status: AC

## 2023-11-06 MED ORDER — VERAPAMIL HCL 2.5 MG/ML IV SOLN
INTRAVENOUS | Status: DC | PRN
  Administered 2023-11-06: 5 mL via INTRA_ARTERIAL

## 2023-11-06 SURGICAL SUPPLY — 9 items
CATH INFINITI 5FR ANG PIGTAIL (CATHETERS) IMPLANT
CATH INFINITI AMBI 6FR TG (CATHETERS) IMPLANT
DEVICE RAD COMP TR BAND LRG (VASCULAR PRODUCTS) IMPLANT
DEVICE RAD TR BAND REGULAR (VASCULAR PRODUCTS) IMPLANT
GLIDESHEATH SLEND SS 6F .021 (SHEATH) IMPLANT
PACK CARDIAC CATHETERIZATION (CUSTOM PROCEDURE TRAY) ×2 IMPLANT
SET ATX-X65L (MISCELLANEOUS) IMPLANT
STATION PROTECTION PRESSURIZED (MISCELLANEOUS) IMPLANT
WIRE EMERALD 3MM-J .035X260CM (WIRE) IMPLANT

## 2023-11-06 NOTE — Interval H&P Note (Signed)
 History and Physical Interval Note:  11/06/2023 9:29 AM  Toni Rich  has presented today for surgery, with the diagnosis of cad.  The various methods of treatment have been discussed with the patient and family. After consideration of risks, benefits and other options for treatment, the patient has consented to  Procedure(s): LEFT HEART CATH AND CORONARY ANGIOGRAPHY (N/A) as a surgical intervention.  The patient's history has been reviewed, patient examined, no change in status, stable for surgery.  I have reviewed the patient's chart and labs.  Questions were answered to the patient's satisfaction.     Raylie Maddison K Jayquan Bradsher

## 2023-11-06 NOTE — Discharge Instructions (Signed)
 Radial Site Care The following information offers guidance on how to care for yourself after your procedure. Your health care provider may also give you more specific instructions. If you have problems or questions, contact your health care provider. What can I expect after the procedure? After the procedure, it is common to have bruising and tenderness in the incision area. Follow these instructions at home: Incision site care  Follow instructions from your health care provider about how to take care of your incision site. Make sure you: Wash your hands with soap and water for at least 20 seconds before and after you change your bandage (dressing). If soap and water are not available, use hand sanitizer. Remove your dressing in 24 hours. Leave stitches (sutures), skin glue, or adhesive strips in place. These skin closures may need to stay in place for 2 weeks or longer. If adhesive strip edges start to loosen and curl up, you may trim the loose edges. Do not remove adhesive strips completely unless your health care provider tells you to do that. Do not take baths, swim, or use a hot tub for at least 1 week. You may shower 24 hours after the procedure or as told by your health care provider. Remove the dressing and gently wash the incision area with plain soap and water. Pat the area dry with a clean towel. Do not rub the site. That could cause bleeding. Do not apply powder or lotion to the site. Check your incision site every day for signs of infection. Check for: Redness, swelling, or pain. Fluid or blood. Warmth. Pus or a bad smell. Activity For 24 hours after the procedure, or as directed by your health care provider: Do not flex or bend the affected arm. Do not push or pull heavy objects with the affected arm. Do not operate machinery or power tools. Do not drive. You should not drive yourself home from the hospital or clinic if you go home during that time period. You may drive 24  hours after the procedure unless your health care provider tells you not to. Do not lift anything that is heavier than 10 lb (4.5 kg), or the limit that you are told, until your health care provider says that it is safe. Return to your normal activities as told by your health care provider. Ask your health care provider what activities are safe for you and when you can return to work. If you were given a sedative during the procedure, it can affect you for several hours. Do not drive or operate machinery until your health care provider says that it is safe. General instructions Take over-the-counter and prescription medicines only as told by your health care provider. If you will be going home right after the procedure, plan to have a responsible adult care for you for the time you are told. This is important. Keep all follow-up visits. This is important. Contact a health care provider if: You have a fever or chills. You have any of these signs of infection at your incision site: Redness, swelling, or pain. Fluid or blood. Warmth. Pus or a bad smell. Get help right away if: The incision area swells very fast. The incision area is bleeding, and the bleeding does not stop when you hold steady pressure on the area. Your arm or hand becomes pale, cool, tingly, or numb. These symptoms may represent a serious problem that is an emergency. Do not wait to see if the symptoms will go away. Get medical  help right away. Call your local emergency services (911 in the [place] Do not drive yourself to the hospital. Summary After the procedure, it is common to have bruising and tenderness at the incision site. Follow instructions from your health care provider about how to take care of your radial site incision. Check the incision every day for signs of infection. Do not lift anything that is heavier than 10 lb (4.5 kg), or the limit that you are told, until your health care provider says that it is  safe. Get help right away if the incision area swells very fast, you have bleeding at the incision site that will not stop, or your arm or hand becomes pale, cool, or numb. This information is not intended to replace advice given to you by your health care provider. Make sure you discuss any questions you have with your health care provider. Document Revised: 05/20/2020 Document Reviewed: 05/20/2020 Elsevier Patient Education  2024 [employer].

## 2023-11-07 ENCOUNTER — Ambulatory Visit: Payer: Self-pay | Admitting: Cardiology

## 2023-11-08 ENCOUNTER — Encounter (HOSPITAL_COMMUNITY): Payer: Self-pay | Admitting: Internal Medicine

## 2023-11-09 ENCOUNTER — Other Ambulatory Visit: Payer: Self-pay | Admitting: Cardiology

## 2023-11-09 DIAGNOSIS — E7800 Pure hypercholesterolemia, unspecified: Secondary | ICD-10-CM

## 2023-11-09 DIAGNOSIS — Z8249 Family history of ischemic heart disease and other diseases of the circulatory system: Secondary | ICD-10-CM

## 2023-11-10 ENCOUNTER — Encounter: Payer: Self-pay | Admitting: Dermatology

## 2023-11-10 ENCOUNTER — Ambulatory Visit: Admitting: Dermatology

## 2023-11-10 VITALS — BP 90/57

## 2023-11-10 DIAGNOSIS — L820 Inflamed seborrheic keratosis: Secondary | ICD-10-CM

## 2023-11-10 DIAGNOSIS — L578 Other skin changes due to chronic exposure to nonionizing radiation: Secondary | ICD-10-CM

## 2023-11-10 DIAGNOSIS — D1801 Hemangioma of skin and subcutaneous tissue: Secondary | ICD-10-CM | POA: Diagnosis not present

## 2023-11-10 DIAGNOSIS — L821 Other seborrheic keratosis: Secondary | ICD-10-CM | POA: Diagnosis not present

## 2023-11-10 DIAGNOSIS — L814 Other melanin hyperpigmentation: Secondary | ICD-10-CM | POA: Diagnosis not present

## 2023-11-10 DIAGNOSIS — D229 Melanocytic nevi, unspecified: Secondary | ICD-10-CM

## 2023-11-10 DIAGNOSIS — W908XXA Exposure to other nonionizing radiation, initial encounter: Secondary | ICD-10-CM

## 2023-11-10 DIAGNOSIS — Z1283 Encounter for screening for malignant neoplasm of skin: Secondary | ICD-10-CM

## 2023-11-10 DIAGNOSIS — D492 Neoplasm of unspecified behavior of bone, soft tissue, and skin: Secondary | ICD-10-CM | POA: Diagnosis not present

## 2023-11-10 DIAGNOSIS — D485 Neoplasm of uncertain behavior of skin: Secondary | ICD-10-CM

## 2023-11-10 NOTE — Patient Instructions (Signed)

## 2023-11-10 NOTE — Progress Notes (Signed)
   New Patient Visit   Subjective  Toni Rich is a 78 y.o. female who presents for the following: Skin Cancer Screening and Full Body Skin Exam - No history of skin cancer. No family history of Melanoma. She has a spot on her right breast that is growing a little bit.  The patient presents for Total-Body Skin Exam (TBSE) for skin cancer screening and mole check. The patient has spots, moles and lesions to be evaluated, some may be new or changing.  The following portions of the chart were reviewed this encounter and updated as appropriate: medications, allergies, medical history  Review of Systems:  No other skin or systemic complaints except as noted in HPI or Assessment and Plan.  Objective  Well appearing patient in no apparent distress; mood and affect are within normal limits.  A full examination was performed including scalp, head, eyes, ears, nose, lips, neck, chest, axillae, abdomen, back, buttocks, bilateral upper extremities, bilateral lower extremities, hands, feet, fingers, toes, fingernails, and toenails. All findings within normal limits unless otherwise noted below.   Relevant physical exam findings are noted in the Assessment and Plan.  Right Breast 1.0 x 0.8 cm tan brown scaly plaque   Assessment & Plan   SKIN CANCER SCREENING PERFORMED TODAY.  ACTINIC DAMAGE - Chronic condition, secondary to cumulative UV/sun exposure - diffuse scaly erythematous macules with underlying dyspigmentation - Recommend daily broad spectrum sunscreen SPF 30+ to sun-exposed areas, reapply every 2 hours as needed.  - Staying in the shade or wearing long sleeves, sun glasses (UVA+UVB protection) and wide brim hats (4-inch brim around the entire circumference of the hat) are also recommended for sun protection.  - Call for new or changing lesions.  LENTIGINES, SEBORRHEIC KERATOSES, HEMANGIOMAS - Benign normal skin lesions - Benign-appearing - Call for any changes  MELANOCYTIC  NEVI - Tan-brown and/or pink-flesh-colored symmetric macules and papules - Benign appearing on exam today - Observation - Call clinic for new or changing moles - Recommend daily use of broad spectrum spf 30+ sunscreen to sun-exposed areas.   NEOPLASM OF UNCERTAIN BEHAVIOR OF SKIN Right Breast Epidermal / dermal shaving  Lesion diameter (cm):  1 Informed consent: discussed and consent obtained   Timeout: patient name, date of birth, surgical site, and procedure verified   Procedure prep:  Patient was prepped and draped in usual sterile fashion Prep type:  Isopropyl alcohol Anesthesia: the lesion was anesthetized in a standard fashion   Anesthetic:  1% lidocaine  w/ epinephrine 1-100,000 buffered w/ 8.4% NaHCO3 Instrument used: flexible razor blade   Hemostasis achieved with: pressure, aluminum chloride and electrodesiccation   Outcome: patient tolerated procedure well   Post-procedure details: sterile dressing applied and wound care instructions given   Dressing type: bandage and petrolatum      Return in about 1 year (around 11/09/2024) for TBSE.  I, Roseline Hutchinson, CMA, am acting as scribe for RUFUS CHRISTELLA HOLY, MD .   Documentation: I have reviewed the above documentation for accuracy and completeness, and I agree with the above.  RUFUS CHRISTELLA HOLY, MD

## 2023-11-12 DIAGNOSIS — I25119 Atherosclerotic heart disease of native coronary artery with unspecified angina pectoris: Secondary | ICD-10-CM | POA: Diagnosis not present

## 2023-11-12 DIAGNOSIS — E039 Hypothyroidism, unspecified: Secondary | ICD-10-CM | POA: Diagnosis not present

## 2023-11-12 DIAGNOSIS — E7800 Pure hypercholesterolemia, unspecified: Secondary | ICD-10-CM | POA: Diagnosis not present

## 2023-11-12 LAB — SURGICAL PATHOLOGY

## 2023-11-13 ENCOUNTER — Ambulatory Visit: Payer: Self-pay | Admitting: Dermatology

## 2023-11-19 ENCOUNTER — Ambulatory Visit
Admission: RE | Admit: 2023-11-19 | Discharge: 2023-11-19 | Disposition: A | Discharge status: Home / Self Care | Source: Ambulatory Visit | Attending: Family Medicine | Admitting: Family Medicine

## 2023-11-19 DIAGNOSIS — R413 Other amnesia: Secondary | ICD-10-CM

## 2023-11-19 DIAGNOSIS — G319 Degenerative disease of nervous system, unspecified: Secondary | ICD-10-CM | POA: Diagnosis not present

## 2023-11-19 MED ORDER — GADOPICLENOL 0.5 MMOL/ML IV SOLN
6.0000 mL | Freq: Once | INTRAVENOUS | Status: AC | PRN
  Administered 2023-11-19: 6 mL via INTRAVENOUS

## 2023-12-08 DIAGNOSIS — R053 Chronic cough: Secondary | ICD-10-CM | POA: Diagnosis not present

## 2023-12-10 DIAGNOSIS — K219 Gastro-esophageal reflux disease without esophagitis: Secondary | ICD-10-CM | POA: Diagnosis not present

## 2023-12-10 DIAGNOSIS — I2510 Atherosclerotic heart disease of native coronary artery without angina pectoris: Secondary | ICD-10-CM | POA: Diagnosis not present

## 2023-12-10 DIAGNOSIS — Z86018 Personal history of other benign neoplasm: Secondary | ICD-10-CM | POA: Diagnosis not present

## 2023-12-13 DIAGNOSIS — E7800 Pure hypercholesterolemia, unspecified: Secondary | ICD-10-CM | POA: Diagnosis not present

## 2023-12-13 DIAGNOSIS — I25119 Atherosclerotic heart disease of native coronary artery with unspecified angina pectoris: Secondary | ICD-10-CM | POA: Diagnosis not present

## 2023-12-13 DIAGNOSIS — E039 Hypothyroidism, unspecified: Secondary | ICD-10-CM | POA: Diagnosis not present

## 2023-12-16 DIAGNOSIS — J22 Unspecified acute lower respiratory infection: Secondary | ICD-10-CM | POA: Diagnosis not present

## 2023-12-19 DIAGNOSIS — R5383 Other fatigue: Secondary | ICD-10-CM | POA: Diagnosis not present

## 2023-12-19 DIAGNOSIS — R051 Acute cough: Secondary | ICD-10-CM | POA: Diagnosis not present

## 2023-12-19 DIAGNOSIS — R52 Pain, unspecified: Secondary | ICD-10-CM | POA: Diagnosis not present

## 2024-01-11 DIAGNOSIS — J40 Bronchitis, not specified as acute or chronic: Secondary | ICD-10-CM | POA: Diagnosis not present

## 2024-01-11 DIAGNOSIS — J329 Chronic sinusitis, unspecified: Secondary | ICD-10-CM | POA: Diagnosis not present

## 2024-01-11 DIAGNOSIS — M940 Chondrocostal junction syndrome [Tietze]: Secondary | ICD-10-CM | POA: Diagnosis not present

## 2024-01-12 DIAGNOSIS — E039 Hypothyroidism, unspecified: Secondary | ICD-10-CM | POA: Diagnosis not present

## 2024-01-12 DIAGNOSIS — E7800 Pure hypercholesterolemia, unspecified: Secondary | ICD-10-CM | POA: Diagnosis not present

## 2024-01-12 DIAGNOSIS — I25119 Atherosclerotic heart disease of native coronary artery with unspecified angina pectoris: Secondary | ICD-10-CM | POA: Diagnosis not present

## 2024-01-15 DIAGNOSIS — M940 Chondrocostal junction syndrome [Tietze]: Secondary | ICD-10-CM | POA: Diagnosis not present

## 2024-01-15 DIAGNOSIS — J329 Chronic sinusitis, unspecified: Secondary | ICD-10-CM | POA: Diagnosis not present

## 2024-01-18 ENCOUNTER — Other Ambulatory Visit: Payer: Self-pay | Admitting: Cardiology

## 2024-01-20 DIAGNOSIS — R052 Subacute cough: Secondary | ICD-10-CM | POA: Diagnosis not present

## 2024-01-20 DIAGNOSIS — D329 Benign neoplasm of meninges, unspecified: Secondary | ICD-10-CM | POA: Diagnosis not present

## 2024-01-20 DIAGNOSIS — Z23 Encounter for immunization: Secondary | ICD-10-CM | POA: Diagnosis not present

## 2024-01-20 DIAGNOSIS — K219 Gastro-esophageal reflux disease without esophagitis: Secondary | ICD-10-CM | POA: Diagnosis not present

## 2024-01-21 DIAGNOSIS — H02423 Myogenic ptosis of bilateral eyelids: Secondary | ICD-10-CM | POA: Diagnosis not present

## 2024-01-21 DIAGNOSIS — H04123 Dry eye syndrome of bilateral lacrimal glands: Secondary | ICD-10-CM | POA: Diagnosis not present

## 2024-01-21 DIAGNOSIS — H0279 Other degenerative disorders of eyelid and periocular area: Secondary | ICD-10-CM | POA: Diagnosis not present

## 2024-01-26 DIAGNOSIS — J40 Bronchitis, not specified as acute or chronic: Secondary | ICD-10-CM | POA: Diagnosis not present

## 2024-02-01 DIAGNOSIS — D123 Benign neoplasm of transverse colon: Secondary | ICD-10-CM | POA: Diagnosis not present

## 2024-02-01 DIAGNOSIS — Z860101 Personal history of adenomatous and serrated colon polyps: Secondary | ICD-10-CM | POA: Diagnosis not present

## 2024-02-01 DIAGNOSIS — K648 Other hemorrhoids: Secondary | ICD-10-CM | POA: Diagnosis not present

## 2024-02-01 DIAGNOSIS — K635 Polyp of colon: Secondary | ICD-10-CM | POA: Diagnosis not present

## 2024-02-01 DIAGNOSIS — Z09 Encounter for follow-up examination after completed treatment for conditions other than malignant neoplasm: Secondary | ICD-10-CM | POA: Diagnosis not present

## 2024-02-01 DIAGNOSIS — K5730 Diverticulosis of large intestine without perforation or abscess without bleeding: Secondary | ICD-10-CM | POA: Diagnosis not present

## 2024-02-01 DIAGNOSIS — D122 Benign neoplasm of ascending colon: Secondary | ICD-10-CM | POA: Diagnosis not present

## 2024-02-02 ENCOUNTER — Encounter (INDEPENDENT_AMBULATORY_CARE_PROVIDER_SITE_OTHER): Payer: Medicare Other | Admitting: Ophthalmology

## 2024-02-08 ENCOUNTER — Encounter (INDEPENDENT_AMBULATORY_CARE_PROVIDER_SITE_OTHER): Admitting: Ophthalmology

## 2024-02-08 DIAGNOSIS — H43813 Vitreous degeneration, bilateral: Secondary | ICD-10-CM | POA: Diagnosis not present

## 2024-02-08 DIAGNOSIS — H353132 Nonexudative age-related macular degeneration, bilateral, intermediate dry stage: Secondary | ICD-10-CM

## 2024-02-15 ENCOUNTER — Encounter: Payer: Self-pay | Admitting: Radiology

## 2024-03-08 ENCOUNTER — Encounter: Payer: Self-pay | Admitting: Cardiology

## 2024-04-19 ENCOUNTER — Other Ambulatory Visit: Payer: Self-pay | Admitting: Family Medicine

## 2024-04-19 DIAGNOSIS — D329 Benign neoplasm of meninges, unspecified: Secondary | ICD-10-CM

## 2024-05-05 ENCOUNTER — Encounter: Payer: Self-pay | Admitting: Family Medicine

## 2024-05-17 ENCOUNTER — Other Ambulatory Visit: Payer: Self-pay | Admitting: Family Medicine

## 2024-05-17 ENCOUNTER — Ambulatory Visit
Admission: RE | Admit: 2024-05-17 | Discharge: 2024-05-17 | Disposition: A | Source: Ambulatory Visit | Attending: Family Medicine | Admitting: Family Medicine

## 2024-05-17 DIAGNOSIS — D329 Benign neoplasm of meninges, unspecified: Secondary | ICD-10-CM

## 2024-05-17 MED ORDER — GADOPICLENOL 0.5 MMOL/ML IV SOLN
6.0000 mL | Freq: Once | INTRAVENOUS | Status: AC | PRN
  Administered 2024-05-17: 6 mL via INTRAVENOUS

## 2024-05-19 ENCOUNTER — Other Ambulatory Visit

## 2024-11-10 ENCOUNTER — Ambulatory Visit: Admitting: Dermatology

## 2025-02-07 ENCOUNTER — Encounter (INDEPENDENT_AMBULATORY_CARE_PROVIDER_SITE_OTHER): Admitting: Ophthalmology
